# Patient Record
Sex: Female | Born: 1935 | Race: White | Hispanic: No | Smoking: Never smoker
Health system: Southern US, Community
[De-identification: ages and names within clinical notes are randomized; demographics above are authoritative.]

## PROBLEM LIST (undated history)

## (undated) DIAGNOSIS — R569 Unspecified convulsions: Secondary | ICD-10-CM

## (undated) DIAGNOSIS — J45909 Unspecified asthma, uncomplicated: Secondary | ICD-10-CM

## (undated) DIAGNOSIS — M1990 Unspecified osteoarthritis, unspecified site: Secondary | ICD-10-CM

## (undated) DIAGNOSIS — I10 Essential (primary) hypertension: Secondary | ICD-10-CM

## (undated) HISTORY — PX: BACK SURGERY: SHX140

## (undated) HISTORY — PX: CARDIAC CATHETERIZATION: SHX172

## (undated) HISTORY — PX: JOINT REPLACEMENT: SHX530

---

## 1998-11-16 ENCOUNTER — Encounter: Payer: Self-pay | Admitting: Neurosurgery

## 1998-11-18 ENCOUNTER — Inpatient Hospital Stay (HOSPITAL_COMMUNITY): Admission: RE | Admit: 1998-11-18 | Discharge: 1998-11-22 | Payer: Self-pay | Admitting: Neurosurgery

## 1998-11-18 ENCOUNTER — Encounter: Payer: Self-pay | Admitting: Neurosurgery

## 2000-02-14 ENCOUNTER — Encounter: Payer: Self-pay | Admitting: Neurosurgery

## 2000-02-14 ENCOUNTER — Encounter: Admission: RE | Admit: 2000-02-14 | Discharge: 2000-02-14 | Payer: Self-pay | Admitting: Neurosurgery

## 2003-11-30 ENCOUNTER — Other Ambulatory Visit: Payer: Self-pay

## 2004-07-15 ENCOUNTER — Other Ambulatory Visit: Payer: Self-pay

## 2004-07-28 ENCOUNTER — Ambulatory Visit: Payer: Self-pay | Admitting: General Practice

## 2004-10-03 ENCOUNTER — Ambulatory Visit: Payer: Self-pay | Admitting: Internal Medicine

## 2004-10-12 ENCOUNTER — Ambulatory Visit: Payer: Self-pay | Admitting: Internal Medicine

## 2004-10-13 ENCOUNTER — Ambulatory Visit: Payer: Self-pay | Admitting: Internal Medicine

## 2004-11-16 ENCOUNTER — Ambulatory Visit (HOSPITAL_COMMUNITY): Admission: RE | Admit: 2004-11-16 | Discharge: 2004-11-16 | Payer: Self-pay | Admitting: Neurosurgery

## 2004-12-29 ENCOUNTER — Inpatient Hospital Stay (HOSPITAL_COMMUNITY): Admission: RE | Admit: 2004-12-29 | Discharge: 2005-01-03 | Payer: Self-pay | Admitting: Neurosurgery

## 2005-04-13 ENCOUNTER — Ambulatory Visit: Payer: Self-pay | Admitting: Internal Medicine

## 2005-05-01 ENCOUNTER — Ambulatory Visit: Payer: Self-pay | Admitting: Internal Medicine

## 2005-07-26 ENCOUNTER — Ambulatory Visit: Payer: Self-pay | Admitting: Unknown Physician Specialty

## 2005-09-11 ENCOUNTER — Ambulatory Visit: Payer: Self-pay | Admitting: Otolaryngology

## 2005-09-28 ENCOUNTER — Ambulatory Visit: Payer: Self-pay | Admitting: Pain Medicine

## 2005-10-03 ENCOUNTER — Ambulatory Visit: Payer: Self-pay | Admitting: Pain Medicine

## 2005-10-04 ENCOUNTER — Ambulatory Visit: Payer: Self-pay | Admitting: Pain Medicine

## 2005-10-25 ENCOUNTER — Ambulatory Visit: Payer: Self-pay | Admitting: Internal Medicine

## 2005-10-25 ENCOUNTER — Ambulatory Visit: Payer: Self-pay | Admitting: Physician Assistant

## 2005-11-06 ENCOUNTER — Inpatient Hospital Stay (HOSPITAL_COMMUNITY): Admission: RE | Admit: 2005-11-06 | Discharge: 2005-11-08 | Payer: Self-pay | Admitting: Neurosurgery

## 2005-11-29 ENCOUNTER — Ambulatory Visit: Payer: Self-pay | Admitting: Otolaryngology

## 2005-12-18 ENCOUNTER — Ambulatory Visit: Payer: Self-pay | Admitting: Internal Medicine

## 2006-06-11 ENCOUNTER — Ambulatory Visit: Payer: Self-pay | Admitting: Internal Medicine

## 2006-06-30 ENCOUNTER — Ambulatory Visit: Payer: Self-pay | Admitting: Internal Medicine

## 2006-08-13 ENCOUNTER — Inpatient Hospital Stay (HOSPITAL_COMMUNITY): Admission: RE | Admit: 2006-08-13 | Discharge: 2006-08-16 | Payer: Self-pay | Admitting: Neurosurgery

## 2006-10-30 ENCOUNTER — Ambulatory Visit: Payer: Self-pay

## 2006-11-06 ENCOUNTER — Ambulatory Visit: Payer: Self-pay | Admitting: Internal Medicine

## 2006-12-24 ENCOUNTER — Other Ambulatory Visit: Payer: Self-pay

## 2006-12-24 ENCOUNTER — Ambulatory Visit: Payer: Self-pay | Admitting: General Practice

## 2007-01-04 ENCOUNTER — Ambulatory Visit: Payer: Self-pay | Admitting: Internal Medicine

## 2007-01-07 ENCOUNTER — Ambulatory Visit: Payer: Self-pay | Admitting: General Practice

## 2007-04-04 ENCOUNTER — Ambulatory Visit: Payer: Self-pay | Admitting: Internal Medicine

## 2007-04-04 ENCOUNTER — Inpatient Hospital Stay: Payer: Self-pay | Admitting: Internal Medicine

## 2007-07-24 ENCOUNTER — Ambulatory Visit: Payer: Self-pay | Admitting: Internal Medicine

## 2007-11-21 ENCOUNTER — Ambulatory Visit: Payer: Self-pay | Admitting: Internal Medicine

## 2008-06-24 ENCOUNTER — Inpatient Hospital Stay: Payer: Self-pay | Admitting: Internal Medicine

## 2008-07-01 ENCOUNTER — Ambulatory Visit: Payer: Self-pay | Admitting: Gastroenterology

## 2008-07-23 ENCOUNTER — Ambulatory Visit: Payer: Self-pay | Admitting: Gastroenterology

## 2008-10-20 ENCOUNTER — Ambulatory Visit: Payer: Self-pay | Admitting: Internal Medicine

## 2009-01-19 ENCOUNTER — Ambulatory Visit: Payer: Self-pay | Admitting: Internal Medicine

## 2009-02-19 ENCOUNTER — Inpatient Hospital Stay: Payer: Self-pay | Admitting: Internal Medicine

## 2009-10-06 ENCOUNTER — Ambulatory Visit: Payer: Self-pay | Admitting: Internal Medicine

## 2010-02-24 ENCOUNTER — Ambulatory Visit: Payer: Self-pay | Admitting: Internal Medicine

## 2010-04-04 ENCOUNTER — Ambulatory Visit: Payer: Self-pay | Admitting: Internal Medicine

## 2010-06-28 ENCOUNTER — Ambulatory Visit: Payer: Self-pay | Admitting: Unknown Physician Specialty

## 2010-07-12 ENCOUNTER — Ambulatory Visit: Payer: Self-pay | Admitting: Unknown Physician Specialty

## 2010-07-13 LAB — PATHOLOGY REPORT

## 2010-07-14 ENCOUNTER — Inpatient Hospital Stay: Payer: Self-pay | Admitting: Internal Medicine

## 2010-09-13 ENCOUNTER — Encounter: Payer: Self-pay | Admitting: Specialist

## 2010-09-22 ENCOUNTER — Encounter: Payer: Self-pay | Admitting: Specialist

## 2010-10-07 ENCOUNTER — Ambulatory Visit: Payer: Self-pay | Admitting: Orthopedic Surgery

## 2010-10-23 ENCOUNTER — Encounter: Payer: Self-pay | Admitting: Specialist

## 2011-03-17 ENCOUNTER — Ambulatory Visit: Payer: Self-pay | Admitting: Cardiology

## 2011-06-05 ENCOUNTER — Ambulatory Visit: Payer: Self-pay | Admitting: Internal Medicine

## 2011-10-06 ENCOUNTER — Ambulatory Visit: Payer: Self-pay | Admitting: Ophthalmology

## 2011-10-10 ENCOUNTER — Ambulatory Visit: Payer: Self-pay | Admitting: Ophthalmology

## 2011-11-17 ENCOUNTER — Emergency Department: Payer: Self-pay | Admitting: Emergency Medicine

## 2011-11-17 LAB — COMPREHENSIVE METABOLIC PANEL
Anion Gap: 10 (ref 7–16)
BUN: 12 mg/dL (ref 7–18)
Bilirubin,Total: 0.5 mg/dL (ref 0.2–1.0)
Calcium, Total: 9.4 mg/dL (ref 8.5–10.1)
Chloride: 98 mmol/L (ref 98–107)
Glucose: 111 mg/dL — ABNORMAL HIGH (ref 65–99)
Osmolality: 271 (ref 275–301)
Potassium: 3.3 mmol/L — ABNORMAL LOW (ref 3.5–5.1)
SGOT(AST): 24 U/L (ref 15–37)
SGPT (ALT): 22 U/L
Total Protein: 6.7 g/dL (ref 6.4–8.2)

## 2011-11-17 LAB — CBC
HCT: 38.8 % (ref 35.0–47.0)
MCH: 27.6 pg (ref 26.0–34.0)
MCHC: 32.6 g/dL (ref 32.0–36.0)
MCV: 85 fL (ref 80–100)
Platelet: 365 10*3/uL (ref 150–440)
RDW: 16.8 % — ABNORMAL HIGH (ref 11.5–14.5)

## 2011-11-17 LAB — TROPONIN I: Troponin-I: <0.02 ng/mL

## 2011-11-17 LAB — URINALYSIS, COMPLETE
Bilirubin,UR: NEGATIVE
Leukocyte Esterase: NEGATIVE
Nitrite: NEGATIVE
Ph: 6 (ref 4.5–8.0)
Protein: NEGATIVE
RBC,UR: <1 /HPF (ref 0–5)

## 2011-11-28 ENCOUNTER — Ambulatory Visit: Payer: Self-pay | Admitting: Ophthalmology

## 2012-01-02 LAB — CBC WITH DIFFERENTIAL/PLATELET
Basophil %: 0.6 %
Eosinophil #: 0.1 10*3/uL (ref 0.0–0.7)
Eosinophil %: 1.1 %
HCT: 37.7 % (ref 35.0–47.0)
HGB: 12.1 g/dL (ref 12.0–16.0)
Lymphocyte %: 29.8 %
MCH: 27.7 pg (ref 26.0–34.0)
Monocyte %: 9.7 %
Neutrophil #: 3.6 10*3/uL (ref 1.4–6.5)
Neutrophil %: 58.8 %
RBC: 4.38 10*6/uL (ref 3.80–5.20)

## 2012-01-02 LAB — COMPREHENSIVE METABOLIC PANEL
Anion Gap: 8 (ref 7–16)
Calcium, Total: 9.7 mg/dL (ref 8.5–10.1)
Chloride: 97 mmol/L — ABNORMAL LOW (ref 98–107)
Co2: 26 mmol/L (ref 21–32)
EGFR (African American): >60
Osmolality: 261 (ref 275–301)
Potassium: 3.5 mmol/L (ref 3.5–5.1)
Sodium: 131 mmol/L — ABNORMAL LOW (ref 136–145)

## 2012-01-02 LAB — TSH: Thyroid Stimulating Horm: 8.48 u[IU]/mL — ABNORMAL HIGH

## 2012-01-02 LAB — CK-MB: CK-MB: 1 ng/mL (ref 0.5–3.6)

## 2012-01-03 LAB — CK-MB
CK-MB: 0.7 ng/mL (ref 0.5–3.6)
CK-MB: 0.8 ng/mL (ref 0.5–3.6)

## 2012-01-03 LAB — BASIC METABOLIC PANEL
Co2: 25 mmol/L (ref 21–32)
Creatinine: 0.8 mg/dL (ref 0.60–1.30)
EGFR (African American): >60
EGFR (Non-African Amer.): >60
Glucose: 155 mg/dL — ABNORMAL HIGH (ref 65–99)
Sodium: 138 mmol/L (ref 136–145)

## 2012-01-03 LAB — URINALYSIS, COMPLETE
Bacteria: NONE SEEN
Bilirubin,UR: NEGATIVE
Blood: NEGATIVE
Ketone: NEGATIVE
Ph: 6 (ref 4.5–8.0)
Specific Gravity: 1.006 (ref 1.003–1.030)
Transitional Epi: <1
WBC UR: <1 /HPF (ref 0–5)

## 2012-01-03 LAB — TROPONIN I: Troponin-I: <0.02 ng/mL

## 2012-01-04 ENCOUNTER — Inpatient Hospital Stay: Payer: Self-pay | Admitting: Internal Medicine

## 2012-01-04 LAB — CBC WITH DIFFERENTIAL/PLATELET
Basophil #: 0 10*3/uL (ref 0.0–0.1)
Eosinophil #: 0.1 10*3/uL (ref 0.0–0.7)
HCT: 34.1 % — ABNORMAL LOW (ref 35.0–47.0)
Lymphocyte #: 1.7 10*3/uL (ref 1.0–3.6)
Lymphocyte %: 37.5 %
MCV: 85 fL (ref 80–100)
Neutrophil #: 2.1 10*3/uL (ref 1.4–6.5)
Platelet: 356 10*3/uL (ref 150–440)
RBC: 4.02 10*6/uL (ref 3.80–5.20)
WBC: 4.6 10*3/uL (ref 3.6–11.0)

## 2012-01-04 LAB — CK TOTAL AND CKMB (NOT AT ARMC)
CK, Total: 40 U/L (ref 21–215)
CK, Total: 35 U/L (ref 21–215)

## 2012-01-04 LAB — BASIC METABOLIC PANEL
Co2: 23 mmol/L (ref 21–32)
Creatinine: 0.73 mg/dL (ref 0.60–1.30)
EGFR (African American): >60
EGFR (Non-African Amer.): >60
Sodium: 138 mmol/L (ref 136–145)

## 2012-01-04 LAB — TROPONIN I: Troponin-I: <0.02 ng/mL

## 2012-01-05 LAB — CBC WITH DIFFERENTIAL/PLATELET
Basophil %: 0.8 %
Eosinophil #: 0.1 10*3/uL (ref 0.0–0.7)
HGB: 11.6 g/dL — ABNORMAL LOW (ref 12.0–16.0)
Lymphocyte %: 24.3 %
MCH: 27.6 pg (ref 26.0–34.0)
Monocyte #: 0.8 10*3/uL — ABNORMAL HIGH (ref 0.0–0.7)
Monocyte %: 15.5 %
Neutrophil #: 3.1 10*3/uL (ref 1.4–6.5)
Neutrophil %: 57.5 %
Platelet: 355 10*3/uL (ref 150–440)
RBC: 4.2 10*6/uL (ref 3.80–5.20)
WBC: 5.4 10*3/uL (ref 3.6–11.0)

## 2012-01-05 LAB — COMPREHENSIVE METABOLIC PANEL
Anion Gap: 10 (ref 7–16)
Calcium, Total: 9.4 mg/dL (ref 8.5–10.1)
Chloride: 95 mmol/L — ABNORMAL LOW (ref 98–107)
Creatinine: 0.65 mg/dL (ref 0.60–1.30)
EGFR (African American): >60
Glucose: 106 mg/dL — ABNORMAL HIGH (ref 65–99)
Osmolality: 260 (ref 275–301)
Potassium: 4.2 mmol/L (ref 3.5–5.1)
SGOT(AST): 20 U/L (ref 15–37)
Sodium: 131 mmol/L — ABNORMAL LOW (ref 136–145)
Total Protein: 6.9 g/dL (ref 6.4–8.2)

## 2012-01-06 LAB — CBC WITH DIFFERENTIAL/PLATELET
Basophil %: 0.3 %
Eosinophil #: 0.1 10*3/uL (ref 0.0–0.7)
Eosinophil %: 1 %
HCT: 35.2 % (ref 35.0–47.0)
Lymphocyte #: 1.2 10*3/uL (ref 1.0–3.6)
MCH: 27.9 pg (ref 26.0–34.0)
MCV: 86 fL (ref 80–100)
Monocyte %: 15.4 %
Neutrophil #: 4.2 10*3/uL (ref 1.4–6.5)
RBC: 4.1 10*6/uL (ref 3.80–5.20)
WBC: 6.5 10*3/uL (ref 3.6–11.0)

## 2012-01-06 LAB — MAGNESIUM: Magnesium: 2 mg/dL

## 2012-01-06 LAB — COMPREHENSIVE METABOLIC PANEL
Alkaline Phosphatase: 69 U/L (ref 50–136)
Anion Gap: 10 (ref 7–16)
BUN: 6 mg/dL — ABNORMAL LOW (ref 7–18)
Bilirubin,Total: 0.4 mg/dL (ref 0.2–1.0)
Chloride: 99 mmol/L (ref 98–107)
Creatinine: 0.8 mg/dL (ref 0.60–1.30)
Osmolality: 266 (ref 275–301)
SGPT (ALT): 12 U/L
Sodium: 134 mmol/L — ABNORMAL LOW (ref 136–145)
Total Protein: 6.9 g/dL (ref 6.4–8.2)

## 2012-01-22 ENCOUNTER — Emergency Department: Payer: Self-pay | Admitting: *Deleted

## 2012-01-22 LAB — COMPREHENSIVE METABOLIC PANEL
Albumin: 3.8 g/dL (ref 3.4–5.0)
BUN: 11 mg/dL (ref 7–18)
Co2: 22 mmol/L (ref 21–32)
EGFR (Non-African Amer.): >60
Glucose: 104 mg/dL — ABNORMAL HIGH (ref 65–99)
Potassium: 4.1 mmol/L (ref 3.5–5.1)
SGPT (ALT): 18 U/L
Total Protein: 7.6 g/dL (ref 6.4–8.2)

## 2012-01-22 LAB — URINALYSIS, COMPLETE
Bacteria: NONE SEEN
Bilirubin,UR: NEGATIVE
Glucose,UR: NEGATIVE mg/dL (ref 0–75)
Nitrite: NEGATIVE
Ph: 7 (ref 4.5–8.0)
Protein: NEGATIVE
RBC,UR: <1 /HPF (ref 0–5)
Squamous Epithelial: 1
WBC UR: 1 /HPF (ref 0–5)

## 2012-01-22 LAB — TROPONIN I: Troponin-I: <0.02 ng/mL

## 2012-01-22 LAB — CBC
MCH: 27.7 pg (ref 26.0–34.0)
RDW: 15.9 % — ABNORMAL HIGH (ref 11.5–14.5)

## 2012-01-22 LAB — TSH: Thyroid Stimulating Horm: 10.3 u[IU]/mL — ABNORMAL HIGH

## 2012-01-25 ENCOUNTER — Inpatient Hospital Stay: Payer: Self-pay | Admitting: Internal Medicine

## 2012-01-25 LAB — COMPREHENSIVE METABOLIC PANEL
Alkaline Phosphatase: 101 U/L (ref 50–136)
Anion Gap: 9 (ref 7–16)
BUN: 9 mg/dL (ref 7–18)
Chloride: 90 mmol/L — ABNORMAL LOW (ref 98–107)
Co2: 26 mmol/L (ref 21–32)
Creatinine: 0.67 mg/dL (ref 0.60–1.30)
Osmolality: 250 (ref 275–301)
SGOT(AST): 26 U/L (ref 15–37)
SGPT (ALT): 19 U/L
Total Protein: 7.9 g/dL (ref 6.4–8.2)

## 2012-01-25 LAB — URINALYSIS, COMPLETE
Bacteria: NONE SEEN
Blood: NEGATIVE
Nitrite: NEGATIVE
RBC,UR: <1 /HPF (ref 0–5)
Specific Gravity: 1.009 (ref 1.003–1.030)
Squamous Epithelial: 1
WBC UR: 1 /HPF (ref 0–5)

## 2012-01-25 LAB — CBC
HCT: 38.2 % (ref 35.0–47.0)
HGB: 12.4 g/dL (ref 12.0–16.0)
MCH: 27.8 pg (ref 26.0–34.0)
MCHC: 32.6 g/dL (ref 32.0–36.0)
Platelet: 464 10*3/uL — ABNORMAL HIGH (ref 150–440)
RBC: 4.48 10*6/uL (ref 3.80–5.20)
RDW: 16.2 % — ABNORMAL HIGH (ref 11.5–14.5)

## 2012-01-25 LAB — MAGNESIUM: Magnesium: 1.4 mg/dL — ABNORMAL LOW

## 2012-01-25 LAB — AMYLASE: Amylase: 49 U/L (ref 25–115)

## 2012-01-25 LAB — URIC ACID: Uric Acid: 2.5 mg/dL — ABNORMAL LOW (ref 2.6–6.0)

## 2012-01-25 LAB — T4, FREE: Free Thyroxine: 1.13 ng/dL (ref 0.76–1.46)

## 2012-01-26 LAB — BASIC METABOLIC PANEL
Anion Gap: 10 (ref 7–16)
BUN: 9 mg/dL (ref 7–18)
Calcium, Total: 9.2 mg/dL (ref 8.5–10.1)
EGFR (African American): >60
Glucose: 83 mg/dL (ref 65–99)
Osmolality: 264 (ref 275–301)
Potassium: 4.6 mmol/L (ref 3.5–5.1)

## 2012-01-26 LAB — CBC WITH DIFFERENTIAL/PLATELET
Basophil #: 0 10*3/uL (ref 0.0–0.1)
Basophil %: 0.7 %
HCT: 34.5 % — ABNORMAL LOW (ref 35.0–47.0)
HGB: 11.3 g/dL — ABNORMAL LOW (ref 12.0–16.0)
Lymphocyte #: 1.4 10*3/uL (ref 1.0–3.6)
MCH: 27.7 pg (ref 26.0–34.0)
MCV: 85 fL (ref 80–100)
Monocyte #: 0.8 10*3/uL — ABNORMAL HIGH (ref 0.0–0.7)
Neutrophil %: 61.2 %
RBC: 4.07 10*6/uL (ref 3.80–5.20)
RDW: 16.3 % — ABNORMAL HIGH (ref 11.5–14.5)
WBC: 6.2 10*3/uL (ref 3.6–11.0)

## 2012-01-26 LAB — CHLORIDE, URINE, RANDOM: Chloride, Urine Random: 188 mmol/L — ABNORMAL HIGH

## 2012-01-26 LAB — OSMOLALITY, URINE
Osmolality: 306 mOsm/kg
Osmolality: 513 mOsm/kg

## 2012-01-26 LAB — POTASSIUM, URINE RANDOM: Potassium, Urine Random: 67 mmol/L (ref 55–125)

## 2012-01-26 LAB — HEPATIC FUNCTION PANEL A (ARMC)
Alkaline Phosphatase: 87 U/L (ref 50–136)
Bilirubin, Direct: <0.1 mg/dL (ref 0.00–0.20)
Bilirubin,Total: 0.2 mg/dL (ref 0.2–1.0)
SGOT(AST): 22 U/L (ref 15–37)
SGPT (ALT): 16 U/L
Total Protein: 6.6 g/dL (ref 6.4–8.2)

## 2012-01-26 LAB — SODIUM, URINE, RANDOM: Sodium, Urine Random: 40 mmol/L (ref 20–110)

## 2012-01-27 LAB — BASIC METABOLIC PANEL
Calcium, Total: 9.6 mg/dL (ref 8.5–10.1)
Chloride: 97 mmol/L — ABNORMAL LOW (ref 98–107)
Co2: 25 mmol/L (ref 21–32)
EGFR (African American): >60
EGFR (Non-African Amer.): >60
Osmolality: 262 (ref 275–301)
Sodium: 132 mmol/L — ABNORMAL LOW (ref 136–145)

## 2012-01-27 LAB — URINE CULTURE

## 2012-01-28 LAB — BASIC METABOLIC PANEL
BUN: 8 mg/dL (ref 7–18)
Chloride: 95 mmol/L — ABNORMAL LOW (ref 98–107)
Co2: 26 mmol/L (ref 21–32)
Creatinine: 0.69 mg/dL (ref 0.60–1.30)
EGFR (African American): >60
Glucose: 102 mg/dL — ABNORMAL HIGH (ref 65–99)
Potassium: 4.1 mmol/L (ref 3.5–5.1)

## 2012-01-28 LAB — CBC WITH DIFFERENTIAL/PLATELET
Basophil #: 0 10*3/uL (ref 0.0–0.1)
Eosinophil #: 0.1 10*3/uL (ref 0.0–0.7)
HCT: 35.3 % (ref 35.0–47.0)
Lymphocyte #: 1.9 10*3/uL (ref 1.0–3.6)
Lymphocyte %: 32 %
MCH: 28 pg (ref 26.0–34.0)
MCHC: 32.3 g/dL (ref 32.0–36.0)
MCV: 87 fL (ref 80–100)
Monocyte %: 11.2 %
Neutrophil #: 3.3 10*3/uL (ref 1.4–6.5)
Neutrophil %: 54.6 %
Platelet: 388 10*3/uL (ref 150–440)
RBC: 4.07 10*6/uL (ref 3.80–5.20)
RDW: 16.3 % — ABNORMAL HIGH (ref 11.5–14.5)
WBC: 6 10*3/uL (ref 3.6–11.0)

## 2012-01-29 LAB — BASIC METABOLIC PANEL
Calcium, Total: 9.7 mg/dL (ref 8.5–10.1)
Co2: 27 mmol/L (ref 21–32)
Creatinine: 0.74 mg/dL (ref 0.60–1.30)
Glucose: 112 mg/dL — ABNORMAL HIGH (ref 65–99)
Osmolality: 265 (ref 275–301)
Potassium: 4.5 mmol/L (ref 3.5–5.1)

## 2012-01-30 LAB — BASIC METABOLIC PANEL
Anion Gap: 10 (ref 7–16)
BUN: 15 mg/dL (ref 7–18)
Chloride: 98 mmol/L (ref 98–107)
Co2: 24 mmol/L (ref 21–32)
EGFR (African American): >60
Osmolality: 265 (ref 275–301)
Potassium: 4.5 mmol/L (ref 3.5–5.1)

## 2012-01-31 ENCOUNTER — Encounter: Payer: Self-pay | Admitting: Internal Medicine

## 2012-02-07 LAB — BASIC METABOLIC PANEL
Chloride: 97 mmol/L — ABNORMAL LOW (ref 98–107)
Co2: 26 mmol/L (ref 21–32)
EGFR (African American): >60
Glucose: 85 mg/dL (ref 65–99)

## 2012-02-21 ENCOUNTER — Encounter: Payer: Self-pay | Admitting: Internal Medicine

## 2012-04-18 ENCOUNTER — Emergency Department: Payer: Self-pay | Admitting: *Deleted

## 2012-04-18 LAB — URINALYSIS, COMPLETE
Bilirubin,UR: NEGATIVE
Glucose,UR: NEGATIVE mg/dL (ref 0–75)
Leukocyte Esterase: NEGATIVE
Ph: 8 (ref 4.5–8.0)
Protein: NEGATIVE
RBC,UR: NONE SEEN /HPF (ref 0–5)

## 2012-04-18 LAB — COMPREHENSIVE METABOLIC PANEL
Anion Gap: 13 (ref 7–16)
BUN: 6 mg/dL — ABNORMAL LOW (ref 7–18)
Bilirubin,Total: 0.6 mg/dL (ref 0.2–1.0)
Calcium, Total: 9.4 mg/dL (ref 8.5–10.1)
Co2: 23 mmol/L (ref 21–32)
Creatinine: 1.01 mg/dL (ref 0.60–1.30)
EGFR (Non-African Amer.): 53 — ABNORMAL LOW
Potassium: 2.9 mmol/L — ABNORMAL LOW (ref 3.5–5.1)
SGOT(AST): 28 U/L (ref 15–37)
SGPT (ALT): 20 U/L

## 2012-04-18 LAB — LIPASE, BLOOD: Lipase: 118 U/L (ref 73–393)

## 2012-04-18 LAB — CBC
HCT: 36.8 % (ref 35.0–47.0)
MCH: 27.5 pg (ref 26.0–34.0)
MCHC: 32.1 g/dL (ref 32.0–36.0)
Platelet: 439 10*3/uL (ref 150–440)
RBC: 4.3 10*6/uL (ref 3.80–5.20)
WBC: 9.4 10*3/uL (ref 3.6–11.0)

## 2012-06-05 ENCOUNTER — Ambulatory Visit: Payer: Self-pay | Admitting: Internal Medicine

## 2013-06-27 ENCOUNTER — Emergency Department: Payer: Self-pay | Admitting: Emergency Medicine

## 2013-06-27 LAB — COMPREHENSIVE METABOLIC PANEL
Albumin: 3.4 g/dL (ref 3.4–5.0)
BUN: 13 mg/dL (ref 7–18)
Bilirubin,Total: 0.3 mg/dL (ref 0.2–1.0)
Calcium, Total: 9.9 mg/dL (ref 8.5–10.1)
Co2: 23 mmol/L (ref 21–32)
Creatinine: 0.79 mg/dL (ref 0.60–1.30)
EGFR (Non-African Amer.): >60
Glucose: 99 mg/dL (ref 65–99)
Osmolality: 255 (ref 275–301)
Potassium: 4.3 mmol/L (ref 3.5–5.1)
SGOT(AST): 24 U/L (ref 15–37)
Sodium: 127 mmol/L — ABNORMAL LOW (ref 136–145)
Total Protein: 7.3 g/dL (ref 6.4–8.2)

## 2013-06-27 LAB — URINALYSIS, COMPLETE
Bacteria: NONE SEEN
Bilirubin,UR: NEGATIVE
Blood: NEGATIVE
Glucose,UR: NEGATIVE mg/dL (ref 0–75)
Nitrite: NEGATIVE
Ph: 6 (ref 4.5–8.0)
Protein: 30
RBC,UR: 2 /HPF (ref 0–5)
Specific Gravity: 1.02 (ref 1.003–1.030)
WBC UR: 3 /HPF (ref 0–5)

## 2013-06-27 LAB — CBC
HCT: 36.5 % (ref 35.0–47.0)
HGB: 12.1 g/dL (ref 12.0–16.0)
MCHC: 33.3 g/dL (ref 32.0–36.0)
Platelet: 464 10*3/uL — ABNORMAL HIGH (ref 150–440)
WBC: 11.3 10*3/uL — ABNORMAL HIGH (ref 3.6–11.0)

## 2013-06-27 LAB — TROPONIN I: Troponin-I: <0.02 ng/mL

## 2013-07-03 ENCOUNTER — Inpatient Hospital Stay: Payer: Self-pay | Admitting: Internal Medicine

## 2013-07-03 LAB — CBC WITH DIFFERENTIAL/PLATELET
Basophil #: 0.1 10*3/uL (ref 0.0–0.1)
Eosinophil #: 0.1 10*3/uL (ref 0.0–0.7)
Eosinophil %: 0.9 %
HCT: 34.2 % — ABNORMAL LOW (ref 35.0–47.0)
Lymphocyte #: 1.4 10*3/uL (ref 1.0–3.6)
Lymphocyte %: 21 %
MCH: 28 pg (ref 26.0–34.0)
MCHC: 33.9 g/dL (ref 32.0–36.0)
Monocyte #: 1.1 x10 3/mm — ABNORMAL HIGH (ref 0.2–0.9)
Neutrophil %: 61 %
RDW: 16.2 % — ABNORMAL HIGH (ref 11.5–14.5)

## 2013-07-03 LAB — COMPREHENSIVE METABOLIC PANEL
Albumin: 3.2 g/dL — ABNORMAL LOW (ref 3.4–5.0)
Alkaline Phosphatase: 98 U/L (ref 50–136)
BUN: 9 mg/dL (ref 7–18)
Bilirubin,Total: 0.4 mg/dL (ref 0.2–1.0)
Calcium, Total: 8.9 mg/dL (ref 8.5–10.1)
Co2: 22 mmol/L (ref 21–32)
Creatinine: 0.5 mg/dL — ABNORMAL LOW (ref 0.60–1.30)
EGFR (African American): >60
Glucose: 102 mg/dL — ABNORMAL HIGH (ref 65–99)
Osmolality: 234 (ref 275–301)
Potassium: 3.7 mmol/L (ref 3.5–5.1)
SGPT (ALT): 25 U/L (ref 12–78)
Sodium: 116 mmol/L — CL (ref 136–145)
Total Protein: 6.6 g/dL (ref 6.4–8.2)

## 2013-07-03 LAB — MAGNESIUM: Magnesium: 1.4 mg/dL — ABNORMAL LOW

## 2013-07-03 LAB — SEDIMENTATION RATE: Erythrocyte Sed Rate: 19 mm/hr (ref 0–30)

## 2013-07-04 LAB — BASIC METABOLIC PANEL
BUN: 7 mg/dL (ref 7–18)
Calcium, Total: 8.8 mg/dL (ref 8.5–10.1)
Co2: 21 mmol/L (ref 21–32)
Creatinine: 0.54 mg/dL — ABNORMAL LOW (ref 0.60–1.30)
EGFR (Non-African Amer.): >60
Potassium: 4.1 mmol/L (ref 3.5–5.1)
Sodium: 116 mmol/L — CL (ref 136–145)

## 2013-07-05 LAB — BASIC METABOLIC PANEL
Anion Gap: 6 — ABNORMAL LOW (ref 7–16)
Chloride: 100 mmol/L (ref 98–107)
Co2: 24 mmol/L (ref 21–32)
Creatinine: 0.6 mg/dL (ref 0.60–1.30)
EGFR (African American): >60
Sodium: 130 mmol/L — ABNORMAL LOW (ref 136–145)

## 2013-07-05 LAB — OSMOLALITY, URINE: Osmolality: 77 mOsm/kg

## 2013-07-06 LAB — BASIC METABOLIC PANEL
BUN: 14 mg/dL (ref 7–18)
Calcium, Total: 9.3 mg/dL (ref 8.5–10.1)
Chloride: 104 mmol/L (ref 98–107)
EGFR (African American): >60
EGFR (Non-African Amer.): >60
Glucose: 97 mg/dL (ref 65–99)
Sodium: 134 mmol/L — ABNORMAL LOW (ref 136–145)

## 2013-07-07 LAB — BASIC METABOLIC PANEL
Anion Gap: 6 — ABNORMAL LOW (ref 7–16)
BUN: 14 mg/dL (ref 7–18)
Calcium, Total: 9.2 mg/dL (ref 8.5–10.1)
Chloride: 98 mmol/L (ref 98–107)
Co2: 26 mmol/L (ref 21–32)
Creatinine: 0.71 mg/dL (ref 0.60–1.30)
EGFR (African American): >60
Glucose: 94 mg/dL (ref 65–99)
Sodium: 130 mmol/L — ABNORMAL LOW (ref 136–145)

## 2013-09-11 ENCOUNTER — Ambulatory Visit: Payer: Self-pay | Admitting: Internal Medicine

## 2013-09-26 ENCOUNTER — Ambulatory Visit: Payer: Self-pay | Admitting: Physical Medicine and Rehabilitation

## 2013-10-30 ENCOUNTER — Inpatient Hospital Stay: Payer: Self-pay | Admitting: Internal Medicine

## 2013-10-30 LAB — TSH: Thyroid Stimulating Horm: 6.34 u[IU]/mL — ABNORMAL HIGH

## 2013-10-30 LAB — COMPREHENSIVE METABOLIC PANEL
ALK PHOS: 60 U/L
ALT: 26 U/L (ref 12–78)
ANION GAP: 6 — AB (ref 7–16)
AST: 20 U/L (ref 15–37)
Albumin: 3.7 g/dL (ref 3.4–5.0)
BILIRUBIN TOTAL: 0.4 mg/dL (ref 0.2–1.0)
BUN: 10 mg/dL (ref 7–18)
CALCIUM: 9.5 mg/dL (ref 8.5–10.1)
CHLORIDE: 93 mmol/L — AB (ref 98–107)
Co2: 27 mmol/L (ref 21–32)
Creatinine: 0.65 mg/dL (ref 0.60–1.30)
EGFR (African American): >60
EGFR (Non-African Amer.): >60
GLUCOSE: 87 mg/dL (ref 65–99)
OSMOLALITY: 252 (ref 275–301)
POTASSIUM: 3.9 mmol/L (ref 3.5–5.1)
SODIUM: 126 mmol/L — AB (ref 136–145)
Total Protein: 7 g/dL (ref 6.4–8.2)

## 2013-10-30 LAB — URINALYSIS, COMPLETE
BILIRUBIN, UR: NEGATIVE
Bacteria: NONE SEEN
Blood: NEGATIVE
GLUCOSE, UR: NEGATIVE mg/dL (ref 0–75)
KETONE: NEGATIVE
Leukocyte Esterase: NEGATIVE
Nitrite: NEGATIVE
PH: 7 (ref 4.5–8.0)
PROTEIN: NEGATIVE
RBC, UR: NONE SEEN /HPF (ref 0–5)
Specific Gravity: 1.015 (ref 1.003–1.030)
Squamous Epithelial: 1

## 2013-10-30 LAB — CBC WITH DIFFERENTIAL/PLATELET
BASOS PCT: 0.8 %
Basophil #: 0.1 10*3/uL (ref 0.0–0.1)
EOS PCT: 0.9 %
Eosinophil #: 0.1 10*3/uL (ref 0.0–0.7)
HCT: 38.1 % (ref 35.0–47.0)
HGB: 13 g/dL (ref 12.0–16.0)
LYMPHS ABS: 2.1 10*3/uL (ref 1.0–3.6)
LYMPHS PCT: 24.9 %
MCH: 29.9 pg (ref 26.0–34.0)
MCHC: 34.2 g/dL (ref 32.0–36.0)
MCV: 87 fL (ref 80–100)
MONO ABS: 0.8 x10 3/mm (ref 0.2–0.9)
Monocyte %: 9.7 %
NEUTROS ABS: 5.3 10*3/uL (ref 1.4–6.5)
Neutrophil %: 63.7 %
Platelet: 389 10*3/uL (ref 150–440)
RBC: 4.37 10*6/uL (ref 3.80–5.20)
RDW: 18.5 % — AB (ref 11.5–14.5)
WBC: 8.3 10*3/uL (ref 3.6–11.0)

## 2013-10-31 LAB — BASIC METABOLIC PANEL
Anion Gap: 6 — ABNORMAL LOW (ref 7–16)
BUN: 8 mg/dL (ref 7–18)
CO2: 25 mmol/L (ref 21–32)
CREATININE: 0.64 mg/dL (ref 0.60–1.30)
Calcium, Total: 9.2 mg/dL (ref 8.5–10.1)
Chloride: 100 mmol/L (ref 98–107)
EGFR (African American): >60
EGFR (Non-African Amer.): >60
Glucose: 89 mg/dL (ref 65–99)
OSMOLALITY: 260 (ref 275–301)
Potassium: 3.7 mmol/L (ref 3.5–5.1)
SODIUM: 131 mmol/L — AB (ref 136–145)

## 2013-11-01 LAB — BASIC METABOLIC PANEL
Anion Gap: 7 (ref 7–16)
BUN: 11 mg/dL (ref 7–18)
Calcium, Total: 9.2 mg/dL (ref 8.5–10.1)
Chloride: 105 mmol/L (ref 98–107)
Co2: 22 mmol/L (ref 21–32)
Creatinine: 0.69 mg/dL (ref 0.60–1.30)
EGFR (African American): >60
Glucose: 103 mg/dL — ABNORMAL HIGH (ref 65–99)
OSMOLALITY: 268 (ref 275–301)
POTASSIUM: 3.9 mmol/L (ref 3.5–5.1)
SODIUM: 134 mmol/L — AB (ref 136–145)

## 2013-11-01 LAB — CBC WITH DIFFERENTIAL/PLATELET
Basophil #: 0.1 10*3/uL (ref 0.0–0.1)
Basophil %: 1.4 %
EOS ABS: 0.1 10*3/uL (ref 0.0–0.7)
EOS PCT: 1.6 %
HCT: 33.4 % — AB (ref 35.0–47.0)
HGB: 11.4 g/dL — AB (ref 12.0–16.0)
LYMPHS PCT: 31.6 %
Lymphocyte #: 1.7 10*3/uL (ref 1.0–3.6)
MCH: 30.9 pg (ref 26.0–34.0)
MCHC: 34.3 g/dL (ref 32.0–36.0)
MCV: 90 fL (ref 80–100)
MONOS PCT: 14.1 %
Monocyte #: 0.8 x10 3/mm (ref 0.2–0.9)
Neutrophil #: 2.8 10*3/uL (ref 1.4–6.5)
Neutrophil %: 51.3 %
PLATELETS: 329 10*3/uL (ref 150–440)
RBC: 3.7 10*6/uL — AB (ref 3.80–5.20)
RDW: 18.6 % — AB (ref 11.5–14.5)
WBC: 5.4 10*3/uL (ref 3.6–11.0)

## 2013-11-01 LAB — URINE CULTURE

## 2013-11-02 LAB — BASIC METABOLIC PANEL
Anion Gap: 6 — ABNORMAL LOW (ref 7–16)
BUN: 7 mg/dL (ref 7–18)
Calcium, Total: 8.8 mg/dL (ref 8.5–10.1)
Chloride: 102 mmol/L (ref 98–107)
Co2: 24 mmol/L (ref 21–32)
Creatinine: 0.7 mg/dL (ref 0.60–1.30)
EGFR (African American): >60
EGFR (Non-African Amer.): >60
Glucose: 107 mg/dL — ABNORMAL HIGH (ref 65–99)
Osmolality: 263 (ref 275–301)
Potassium: 3.7 mmol/L (ref 3.5–5.1)
Sodium: 132 mmol/L — ABNORMAL LOW (ref 136–145)

## 2013-11-02 LAB — CBC WITH DIFFERENTIAL/PLATELET
Basophil #: 0.1 10*3/uL (ref 0.0–0.1)
Basophil %: 1.8 %
Eosinophil #: 0.2 10*3/uL (ref 0.0–0.7)
Eosinophil %: 3 %
HCT: 33.8 % — ABNORMAL LOW (ref 35.0–47.0)
HGB: 11.4 g/dL — ABNORMAL LOW (ref 12.0–16.0)
Lymphocyte #: 2 10*3/uL (ref 1.0–3.6)
Lymphocyte %: 30.5 %
MCH: 30.2 pg (ref 26.0–34.0)
MCHC: 33.6 g/dL (ref 32.0–36.0)
MCV: 90 fL (ref 80–100)
Monocyte #: 0.9 x10 3/mm (ref 0.2–0.9)
Monocyte %: 14 %
Neutrophil #: 3.3 10*3/uL (ref 1.4–6.5)
Neutrophil %: 50.7 %
Platelet: 344 10*3/uL (ref 150–440)
RBC: 3.76 10*6/uL — ABNORMAL LOW (ref 3.80–5.20)
RDW: 18.6 % — ABNORMAL HIGH (ref 11.5–14.5)
WBC: 6.4 10*3/uL (ref 3.6–11.0)

## 2013-11-04 LAB — BASIC METABOLIC PANEL
Anion Gap: 5 — ABNORMAL LOW (ref 7–16)
BUN: 11 mg/dL (ref 7–18)
CHLORIDE: 101 mmol/L (ref 98–107)
CO2: 26 mmol/L (ref 21–32)
Calcium, Total: 9.5 mg/dL (ref 8.5–10.1)
Creatinine: 0.7 mg/dL (ref 0.60–1.30)
EGFR (African American): >60
EGFR (Non-African Amer.): >60
Glucose: 127 mg/dL — ABNORMAL HIGH (ref 65–99)
Osmolality: 266 (ref 275–301)
Potassium: 3.8 mmol/L (ref 3.5–5.1)
Sodium: 132 mmol/L — ABNORMAL LOW (ref 136–145)

## 2013-11-07 DIAGNOSIS — K5732 Diverticulitis of large intestine without perforation or abscess without bleeding: Secondary | ICD-10-CM

## 2013-11-07 DIAGNOSIS — M519 Unspecified thoracic, thoracolumbar and lumbosacral intervertebral disc disorder: Secondary | ICD-10-CM

## 2013-11-07 DIAGNOSIS — E871 Hypo-osmolality and hyponatremia: Secondary | ICD-10-CM

## 2013-11-07 DIAGNOSIS — E039 Hypothyroidism, unspecified: Secondary | ICD-10-CM

## 2013-11-07 DIAGNOSIS — I10 Essential (primary) hypertension: Secondary | ICD-10-CM

## 2013-11-07 DIAGNOSIS — M5430 Sciatica, unspecified side: Secondary | ICD-10-CM

## 2013-12-05 ENCOUNTER — Inpatient Hospital Stay: Payer: Self-pay | Admitting: Internal Medicine

## 2013-12-05 ENCOUNTER — Ambulatory Visit: Payer: Self-pay | Admitting: Internal Medicine

## 2013-12-05 LAB — COMPREHENSIVE METABOLIC PANEL
ALBUMIN: 3.3 g/dL — AB (ref 3.4–5.0)
ALK PHOS: 70 U/L
ALT: 19 U/L (ref 12–78)
AST: 21 U/L (ref 15–37)
Anion Gap: 4 — ABNORMAL LOW (ref 7–16)
BUN: 8 mg/dL (ref 7–18)
Bilirubin,Total: 0.4 mg/dL (ref 0.2–1.0)
CALCIUM: 9.8 mg/dL (ref 8.5–10.1)
CHLORIDE: 97 mmol/L — AB (ref 98–107)
Co2: 28 mmol/L (ref 21–32)
Creatinine: 0.75 mg/dL (ref 0.60–1.30)
EGFR (African American): >60
EGFR (Non-African Amer.): >60
GLUCOSE: 113 mg/dL — AB (ref 65–99)
OSMOLALITY: 258 (ref 275–301)
Potassium: 3.4 mmol/L — ABNORMAL LOW (ref 3.5–5.1)
Sodium: 129 mmol/L — ABNORMAL LOW (ref 136–145)
Total Protein: 6.9 g/dL (ref 6.4–8.2)

## 2013-12-05 LAB — CBC WITH DIFFERENTIAL/PLATELET
Basophil #: 0.1 10*3/uL (ref 0.0–0.1)
Basophil %: 1 %
Eosinophil #: 0.2 10*3/uL (ref 0.0–0.7)
Eosinophil %: 1.5 %
HCT: 36.3 % (ref 35.0–47.0)
HGB: 12.4 g/dL (ref 12.0–16.0)
LYMPHS PCT: 19.4 %
Lymphocyte #: 1.9 10*3/uL (ref 1.0–3.6)
MCH: 31.5 pg (ref 26.0–34.0)
MCHC: 34.2 g/dL (ref 32.0–36.0)
MCV: 92 fL (ref 80–100)
Monocyte #: 1.3 x10 3/mm — ABNORMAL HIGH (ref 0.2–0.9)
Monocyte %: 12.9 %
NEUTROS ABS: 6.5 10*3/uL (ref 1.4–6.5)
Neutrophil %: 65.2 %
PLATELETS: 322 10*3/uL (ref 150–440)
RBC: 3.94 10*6/uL (ref 3.80–5.20)
RDW: 14.8 % — ABNORMAL HIGH (ref 11.5–14.5)
WBC: 9.9 10*3/uL (ref 3.6–11.0)

## 2013-12-07 LAB — CBC WITH DIFFERENTIAL/PLATELET
Basophil #: 0.1 10*3/uL (ref 0.0–0.1)
Basophil %: 1 %
EOS ABS: 0.2 10*3/uL (ref 0.0–0.7)
EOS PCT: 3 %
HCT: 34.9 % — AB (ref 35.0–47.0)
HGB: 11.6 g/dL — ABNORMAL LOW (ref 12.0–16.0)
Lymphocyte #: 2 10*3/uL (ref 1.0–3.6)
Lymphocyte %: 28.8 %
MCH: 30.8 pg (ref 26.0–34.0)
MCHC: 33.3 g/dL (ref 32.0–36.0)
MCV: 93 fL (ref 80–100)
MONO ABS: 0.9 x10 3/mm (ref 0.2–0.9)
Monocyte %: 13.1 %
Neutrophil #: 3.8 10*3/uL (ref 1.4–6.5)
Neutrophil %: 54.1 %
Platelet: 308 10*3/uL (ref 150–440)
RBC: 3.77 10*6/uL — AB (ref 3.80–5.20)
RDW: 14.5 % (ref 11.5–14.5)
WBC: 7 10*3/uL (ref 3.6–11.0)

## 2013-12-07 LAB — BASIC METABOLIC PANEL
ANION GAP: 3 — AB (ref 7–16)
BUN: 4 mg/dL — ABNORMAL LOW (ref 7–18)
CALCIUM: 9.7 mg/dL (ref 8.5–10.1)
Chloride: 105 mmol/L (ref 98–107)
Co2: 28 mmol/L (ref 21–32)
Creatinine: 0.62 mg/dL (ref 0.60–1.30)
EGFR (African American): >60
EGFR (Non-African Amer.): >60
GLUCOSE: 94 mg/dL (ref 65–99)
Osmolality: 269 (ref 275–301)
Potassium: 3.6 mmol/L (ref 3.5–5.1)
Sodium: 136 mmol/L (ref 136–145)

## 2013-12-08 LAB — BASIC METABOLIC PANEL
ANION GAP: 5 — AB (ref 7–16)
BUN: 4 mg/dL — ABNORMAL LOW (ref 7–18)
CALCIUM: 9.3 mg/dL (ref 8.5–10.1)
CO2: 28 mmol/L (ref 21–32)
CREATININE: 0.69 mg/dL (ref 0.60–1.30)
Chloride: 103 mmol/L (ref 98–107)
EGFR (African American): >60
EGFR (Non-African Amer.): >60
Glucose: 85 mg/dL (ref 65–99)
Osmolality: 268 (ref 275–301)
Potassium: 3.9 mmol/L (ref 3.5–5.1)
Sodium: 136 mmol/L (ref 136–145)

## 2013-12-08 LAB — CBC WITH DIFFERENTIAL/PLATELET
BASOS ABS: 0.1 10*3/uL (ref 0.0–0.1)
Basophil %: 1.1 %
Eosinophil #: 0.2 10*3/uL (ref 0.0–0.7)
Eosinophil %: 2.9 %
HCT: 35.9 % (ref 35.0–47.0)
HGB: 11.8 g/dL — AB (ref 12.0–16.0)
LYMPHS ABS: 2.3 10*3/uL (ref 1.0–3.6)
Lymphocyte %: 35.1 %
MCH: 30.6 pg (ref 26.0–34.0)
MCHC: 33 g/dL (ref 32.0–36.0)
MCV: 93 fL (ref 80–100)
Monocyte #: 0.9 x10 3/mm (ref 0.2–0.9)
Monocyte %: 14 %
Neutrophil #: 3.1 10*3/uL (ref 1.4–6.5)
Neutrophil %: 46.9 %
PLATELETS: 313 10*3/uL (ref 150–440)
RBC: 3.87 10*6/uL (ref 3.80–5.20)
RDW: 14.3 % (ref 11.5–14.5)
WBC: 6.5 10*3/uL (ref 3.6–11.0)

## 2013-12-10 LAB — CULTURE, BLOOD (SINGLE)

## 2014-02-04 ENCOUNTER — Ambulatory Visit: Payer: Self-pay | Admitting: Internal Medicine

## 2014-02-20 ENCOUNTER — Ambulatory Visit: Payer: Self-pay | Admitting: Family Medicine

## 2014-03-29 ENCOUNTER — Emergency Department: Payer: Self-pay | Admitting: Emergency Medicine

## 2014-03-29 LAB — COMPREHENSIVE METABOLIC PANEL
ALK PHOS: 73 U/L
ANION GAP: 7 (ref 7–16)
AST: 25 U/L (ref 15–37)
Albumin: 3.6 g/dL (ref 3.4–5.0)
BUN: 8 mg/dL (ref 7–18)
Bilirubin,Total: 0.4 mg/dL (ref 0.2–1.0)
Calcium, Total: 9.6 mg/dL (ref 8.5–10.1)
Chloride: 97 mmol/L — ABNORMAL LOW (ref 98–107)
Co2: 28 mmol/L (ref 21–32)
Creatinine: 0.83 mg/dL (ref 0.60–1.30)
EGFR (African American): >60
EGFR (Non-African Amer.): >60
Glucose: 95 mg/dL (ref 65–99)
OSMOLALITY: 263 (ref 275–301)
Potassium: 3.8 mmol/L (ref 3.5–5.1)
SGPT (ALT): 20 U/L (ref 12–78)
Sodium: 132 mmol/L — ABNORMAL LOW (ref 136–145)
Total Protein: 7 g/dL (ref 6.4–8.2)

## 2014-03-29 LAB — CBC WITH DIFFERENTIAL/PLATELET
BASOS PCT: 1.1 %
Basophil #: 0.1 10*3/uL (ref 0.0–0.1)
Eosinophil #: 0.2 10*3/uL (ref 0.0–0.7)
Eosinophil %: 1.9 %
HCT: 39.2 % (ref 35.0–47.0)
HGB: 12.7 g/dL (ref 12.0–16.0)
LYMPHS ABS: 1.9 10*3/uL (ref 1.0–3.6)
Lymphocyte %: 21.4 %
MCH: 29.2 pg (ref 26.0–34.0)
MCHC: 32.3 g/dL (ref 32.0–36.0)
MCV: 90 fL (ref 80–100)
MONO ABS: 1 x10 3/mm — AB (ref 0.2–0.9)
Monocyte %: 11.1 %
NEUTROS PCT: 64.5 %
Neutrophil #: 5.6 10*3/uL (ref 1.4–6.5)
Platelet: 423 10*3/uL (ref 150–440)
RBC: 4.34 10*6/uL (ref 3.80–5.20)
RDW: 14.5 % (ref 11.5–14.5)
WBC: 8.6 10*3/uL (ref 3.6–11.0)

## 2014-03-29 LAB — LIPASE, BLOOD: LIPASE: 157 U/L (ref 73–393)

## 2014-03-29 LAB — TROPONIN I

## 2014-04-02 ENCOUNTER — Inpatient Hospital Stay: Payer: Self-pay

## 2014-04-02 LAB — CBC
HCT: 37.3 % (ref 35.0–47.0)
HGB: 12.5 g/dL (ref 12.0–16.0)
MCH: 29.6 pg (ref 26.0–34.0)
MCHC: 33.4 g/dL (ref 32.0–36.0)
MCV: 89 fL (ref 80–100)
Platelet: 423 10*3/uL (ref 150–440)
RBC: 4.2 10*6/uL (ref 3.80–5.20)
RDW: 14.5 % (ref 11.5–14.5)
WBC: 8.3 10*3/uL (ref 3.6–11.0)

## 2014-04-02 LAB — URINALYSIS, COMPLETE
BLOOD: NEGATIVE
Bacteria: NONE SEEN
Bilirubin,UR: NEGATIVE
Glucose,UR: NEGATIVE mg/dL (ref 0–75)
Ketone: NEGATIVE
Leukocyte Esterase: NEGATIVE
Nitrite: NEGATIVE
PROTEIN: NEGATIVE
Ph: 7 (ref 4.5–8.0)
Specific Gravity: 1.016 (ref 1.003–1.030)
Squamous Epithelial: 1

## 2014-04-02 LAB — COMPREHENSIVE METABOLIC PANEL
AST: 20 U/L (ref 15–37)
Albumin: 3.6 g/dL (ref 3.4–5.0)
Alkaline Phosphatase: 68 U/L
Anion Gap: 8 (ref 7–16)
BILIRUBIN TOTAL: 0.5 mg/dL (ref 0.2–1.0)
BUN: 10 mg/dL (ref 7–18)
Calcium, Total: 9.8 mg/dL (ref 8.5–10.1)
Chloride: 91 mmol/L — ABNORMAL LOW (ref 98–107)
Co2: 24 mmol/L (ref 21–32)
Creatinine: 0.66 mg/dL (ref 0.60–1.30)
EGFR (African American): >60
GLUCOSE: 109 mg/dL — AB (ref 65–99)
Osmolality: 247 (ref 275–301)
Potassium: 3.9 mmol/L (ref 3.5–5.1)
SGPT (ALT): 20 U/L (ref 12–78)
SODIUM: 123 mmol/L — AB (ref 136–145)
Total Protein: 6.8 g/dL (ref 6.4–8.2)

## 2014-04-02 LAB — LIPASE, BLOOD: LIPASE: 172 U/L (ref 73–393)

## 2014-04-02 LAB — TROPONIN I: Troponin-I: <0.02 ng/mL

## 2014-04-03 LAB — CBC WITH DIFFERENTIAL/PLATELET
BASOS ABS: 0.1 10*3/uL (ref 0.0–0.1)
Basophil %: 0.8 %
EOS ABS: 0.1 10*3/uL (ref 0.0–0.7)
Eosinophil %: 1.3 %
HCT: 34.7 % — ABNORMAL LOW (ref 35.0–47.0)
HGB: 11.3 g/dL — ABNORMAL LOW (ref 12.0–16.0)
LYMPHS ABS: 1.3 10*3/uL (ref 1.0–3.6)
LYMPHS PCT: 20.4 %
MCH: 29.6 pg (ref 26.0–34.0)
MCHC: 32.7 g/dL (ref 32.0–36.0)
MCV: 91 fL (ref 80–100)
MONOS PCT: 10.7 %
Monocyte #: 0.7 x10 3/mm (ref 0.2–0.9)
NEUTROS ABS: 4.4 10*3/uL (ref 1.4–6.5)
Neutrophil %: 66.8 %
Platelet: 367 10*3/uL (ref 150–440)
RBC: 3.84 10*6/uL (ref 3.80–5.20)
RDW: 14.7 % — AB (ref 11.5–14.5)
WBC: 6.6 10*3/uL (ref 3.6–11.0)

## 2014-04-03 LAB — URINALYSIS, COMPLETE
Bacteria: NONE SEEN
Bilirubin,UR: NEGATIVE
Blood: NEGATIVE
Glucose,UR: NEGATIVE mg/dL (ref 0–75)
Leukocyte Esterase: NEGATIVE
Nitrite: NEGATIVE
PROTEIN: NEGATIVE
Ph: 5 (ref 4.5–8.0)
RBC,UR: 1 /HPF (ref 0–5)
Specific Gravity: 1.023 (ref 1.003–1.030)
Squamous Epithelial: <1
WBC UR: 1 /HPF (ref 0–5)

## 2014-04-03 LAB — CLOSTRIDIUM DIFFICILE(ARMC)

## 2014-04-03 LAB — BASIC METABOLIC PANEL
Anion Gap: 7 (ref 7–16)
BUN: 7 mg/dL (ref 7–18)
CHLORIDE: 99 mmol/L (ref 98–107)
CO2: 27 mmol/L (ref 21–32)
Calcium, Total: 8.8 mg/dL (ref 8.5–10.1)
Creatinine: 0.73 mg/dL (ref 0.60–1.30)
EGFR (Non-African Amer.): >60
Glucose: 87 mg/dL (ref 65–99)
Osmolality: 264 (ref 275–301)
Potassium: 4.2 mmol/L (ref 3.5–5.1)
Sodium: 133 mmol/L — ABNORMAL LOW (ref 136–145)

## 2014-04-03 LAB — MAGNESIUM: Magnesium: 2.2 mg/dL

## 2014-04-03 LAB — TSH: THYROID STIMULATING HORM: 6.57 u[IU]/mL — AB

## 2014-04-06 LAB — BASIC METABOLIC PANEL
ANION GAP: 4 — AB (ref 7–16)
BUN: 11 mg/dL (ref 7–18)
CALCIUM: 9.8 mg/dL (ref 8.5–10.1)
CREATININE: 0.72 mg/dL (ref 0.60–1.30)
Chloride: 96 mmol/L — ABNORMAL LOW (ref 98–107)
Co2: 31 mmol/L (ref 21–32)
Glucose: 140 mg/dL — ABNORMAL HIGH (ref 65–99)
Osmolality: 264 (ref 275–301)
Potassium: 4.1 mmol/L (ref 3.5–5.1)
Sodium: 131 mmol/L — ABNORMAL LOW (ref 136–145)

## 2014-04-07 LAB — BASIC METABOLIC PANEL
Anion Gap: 5 — ABNORMAL LOW (ref 7–16)
BUN: 14 mg/dL (ref 7–18)
CALCIUM: 9.6 mg/dL (ref 8.5–10.1)
CHLORIDE: 99 mmol/L (ref 98–107)
Co2: 29 mmol/L (ref 21–32)
Creatinine: 0.74 mg/dL (ref 0.60–1.30)
EGFR (African American): >60
EGFR (Non-African Amer.): >60
GLUCOSE: 106 mg/dL — AB (ref 65–99)
OSMOLALITY: 267 (ref 275–301)
Potassium: 4.5 mmol/L (ref 3.5–5.1)
Sodium: 133 mmol/L — ABNORMAL LOW (ref 136–145)

## 2014-05-11 ENCOUNTER — Inpatient Hospital Stay: Payer: Self-pay | Admitting: Internal Medicine

## 2014-05-11 LAB — URINALYSIS, COMPLETE
BLOOD: NEGATIVE
Bilirubin,UR: NEGATIVE
GLUCOSE, UR: NEGATIVE mg/dL (ref 0–75)
KETONE: NEGATIVE
LEUKOCYTE ESTERASE: NEGATIVE
Nitrite: NEGATIVE
Ph: 7 (ref 4.5–8.0)
Protein: NEGATIVE
RBC,UR: 1 /HPF (ref 0–5)
SPECIFIC GRAVITY: 1.012 (ref 1.003–1.030)

## 2014-05-11 LAB — COMPREHENSIVE METABOLIC PANEL
ALBUMIN: 3.3 g/dL — AB (ref 3.4–5.0)
ALK PHOS: 80 U/L
ANION GAP: 10 (ref 7–16)
AST: 24 U/L (ref 15–37)
BILIRUBIN TOTAL: 0.3 mg/dL (ref 0.2–1.0)
BUN: 10 mg/dL (ref 7–18)
CHLORIDE: 82 mmol/L — AB (ref 98–107)
Calcium, Total: 9.1 mg/dL (ref 8.5–10.1)
Co2: 25 mmol/L (ref 21–32)
Creatinine: 0.67 mg/dL (ref 0.60–1.30)
EGFR (African American): >60
EGFR (Non-African Amer.): >60
GLUCOSE: 112 mg/dL — AB (ref 65–99)
OSMOLALITY: 236 (ref 275–301)
POTASSIUM: 4.4 mmol/L (ref 3.5–5.1)
SGPT (ALT): 24 U/L (ref 12–78)
Sodium: 117 mmol/L — CL (ref 136–145)
Total Protein: 7 g/dL (ref 6.4–8.2)

## 2014-05-11 LAB — CBC WITH DIFFERENTIAL/PLATELET
Basophil #: 0.1 10*3/uL (ref 0.0–0.1)
Basophil %: 0.8 %
Eosinophil #: 0.1 10*3/uL (ref 0.0–0.7)
Eosinophil %: 0.7 %
HCT: 36.4 % (ref 35.0–47.0)
HGB: 12.1 g/dL (ref 12.0–16.0)
Lymphocyte #: 1.5 10*3/uL (ref 1.0–3.6)
Lymphocyte %: 14.5 %
MCH: 29.4 pg (ref 26.0–34.0)
MCHC: 33.3 g/dL (ref 32.0–36.0)
MCV: 88 fL (ref 80–100)
MONO ABS: 1.4 x10 3/mm — AB (ref 0.2–0.9)
Monocyte %: 14.2 %
NEUTROS ABS: 7.2 10*3/uL — AB (ref 1.4–6.5)
Neutrophil %: 69.8 %
PLATELETS: 423 10*3/uL (ref 150–440)
RBC: 4.11 10*6/uL (ref 3.80–5.20)
RDW: 14.3 % (ref 11.5–14.5)
WBC: 10.2 10*3/uL (ref 3.6–11.0)

## 2014-05-11 LAB — T4, FREE: FREE THYROXINE: 1.69 ng/dL — AB (ref 0.76–1.46)

## 2014-05-12 LAB — BASIC METABOLIC PANEL
ANION GAP: 9 (ref 7–16)
BUN: 9 mg/dL (ref 7–18)
CALCIUM: 8.7 mg/dL (ref 8.5–10.1)
CO2: 24 mmol/L (ref 21–32)
CREATININE: 0.67 mg/dL (ref 0.60–1.30)
Chloride: 95 mmol/L — ABNORMAL LOW (ref 98–107)
EGFR (African American): >60
Glucose: 82 mg/dL (ref 65–99)
OSMOLALITY: 255 (ref 275–301)
Potassium: 3.9 mmol/L (ref 3.5–5.1)
Sodium: 128 mmol/L — ABNORMAL LOW (ref 136–145)

## 2014-05-13 LAB — BASIC METABOLIC PANEL
ANION GAP: 8 (ref 7–16)
BUN: 7 mg/dL (ref 7–18)
Calcium, Total: 8.3 mg/dL — ABNORMAL LOW (ref 8.5–10.1)
Chloride: 96 mmol/L — ABNORMAL LOW (ref 98–107)
Co2: 24 mmol/L (ref 21–32)
Creatinine: 0.63 mg/dL (ref 0.60–1.30)
EGFR (Non-African Amer.): >60
Glucose: 81 mg/dL (ref 65–99)
Osmolality: 254 (ref 275–301)
Potassium: 3.7 mmol/L (ref 3.5–5.1)
Sodium: 128 mmol/L — ABNORMAL LOW (ref 136–145)

## 2014-05-13 LAB — TSH: Thyroid Stimulating Horm: 3.62 u[IU]/mL

## 2014-05-14 LAB — BASIC METABOLIC PANEL
Anion Gap: 8 (ref 7–16)
BUN: 9 mg/dL (ref 7–18)
CALCIUM: 8.6 mg/dL (ref 8.5–10.1)
CHLORIDE: 101 mmol/L (ref 98–107)
CO2: 24 mmol/L (ref 21–32)
Creatinine: 0.81 mg/dL (ref 0.60–1.30)
EGFR (African American): >60
EGFR (Non-African Amer.): >60
Glucose: 146 mg/dL — ABNORMAL HIGH (ref 65–99)
Osmolality: 268 (ref 275–301)
POTASSIUM: 3.6 mmol/L (ref 3.5–5.1)
Sodium: 133 mmol/L — ABNORMAL LOW (ref 136–145)

## 2014-05-14 LAB — URINALYSIS, COMPLETE
Bilirubin,UR: NEGATIVE
Glucose,UR: NEGATIVE mg/dL (ref 0–75)
KETONE: NEGATIVE
NITRITE: NEGATIVE
Ph: 7 (ref 4.5–8.0)
Protein: NEGATIVE
RBC,UR: 2 /HPF (ref 0–5)
Specific Gravity: 1.004 (ref 1.003–1.030)
Squamous Epithelial: <1
WBC UR: 72 /HPF (ref 0–5)

## 2014-05-15 LAB — BASIC METABOLIC PANEL
Anion Gap: 8 (ref 7–16)
BUN: 8 mg/dL (ref 7–18)
CREATININE: 0.75 mg/dL (ref 0.60–1.30)
Calcium, Total: 8.3 mg/dL — ABNORMAL LOW (ref 8.5–10.1)
Chloride: 96 mmol/L — ABNORMAL LOW (ref 98–107)
Co2: 26 mmol/L (ref 21–32)
EGFR (African American): >60
GLUCOSE: 92 mg/dL (ref 65–99)
Osmolality: 259 (ref 275–301)
POTASSIUM: 3.5 mmol/L (ref 3.5–5.1)
Sodium: 130 mmol/L — ABNORMAL LOW (ref 136–145)

## 2014-05-16 LAB — BASIC METABOLIC PANEL
Anion Gap: 8 (ref 7–16)
BUN: 7 mg/dL (ref 7–18)
CALCIUM: 8.7 mg/dL (ref 8.5–10.1)
Chloride: 97 mmol/L — ABNORMAL LOW (ref 98–107)
Co2: 28 mmol/L (ref 21–32)
Creatinine: 0.49 mg/dL — ABNORMAL LOW (ref 0.60–1.30)
EGFR (African American): >60
Glucose: 83 mg/dL (ref 65–99)
Osmolality: 263 (ref 275–301)
POTASSIUM: 3.3 mmol/L — AB (ref 3.5–5.1)
SODIUM: 133 mmol/L — AB (ref 136–145)

## 2014-05-17 LAB — BASIC METABOLIC PANEL
ANION GAP: 7 (ref 7–16)
BUN: 7 mg/dL (ref 7–18)
CALCIUM: 8.4 mg/dL — AB (ref 8.5–10.1)
CHLORIDE: 98 mmol/L (ref 98–107)
Co2: 28 mmol/L (ref 21–32)
Creatinine: 0.55 mg/dL — ABNORMAL LOW (ref 0.60–1.30)
EGFR (African American): >60
EGFR (Non-African Amer.): >60
Glucose: 87 mg/dL (ref 65–99)
OSMOLALITY: 264 (ref 275–301)
POTASSIUM: 3.5 mmol/L (ref 3.5–5.1)
Sodium: 133 mmol/L — ABNORMAL LOW (ref 136–145)

## 2014-05-17 LAB — URINE CULTURE

## 2014-05-18 LAB — BASIC METABOLIC PANEL
ANION GAP: 7 (ref 7–16)
BUN: 8 mg/dL (ref 7–18)
CREATININE: 0.67 mg/dL (ref 0.60–1.30)
Calcium, Total: 9.1 mg/dL (ref 8.5–10.1)
Chloride: 97 mmol/L — ABNORMAL LOW (ref 98–107)
Co2: 27 mmol/L (ref 21–32)
EGFR (African American): >60
GLUCOSE: 86 mg/dL (ref 65–99)
Osmolality: 260 (ref 275–301)
Potassium: 3.7 mmol/L (ref 3.5–5.1)
Sodium: 131 mmol/L — ABNORMAL LOW (ref 136–145)

## 2014-05-19 DIAGNOSIS — E2749 Other adrenocortical insufficiency: Secondary | ICD-10-CM

## 2014-05-19 DIAGNOSIS — IMO0002 Reserved for concepts with insufficient information to code with codable children: Secondary | ICD-10-CM

## 2014-05-19 DIAGNOSIS — R1110 Vomiting, unspecified: Secondary | ICD-10-CM

## 2014-05-19 DIAGNOSIS — E871 Hypo-osmolality and hyponatremia: Secondary | ICD-10-CM

## 2014-05-19 DIAGNOSIS — I10 Essential (primary) hypertension: Secondary | ICD-10-CM

## 2014-05-26 DIAGNOSIS — R609 Edema, unspecified: Secondary | ICD-10-CM

## 2014-06-04 DIAGNOSIS — E871 Hypo-osmolality and hyponatremia: Secondary | ICD-10-CM

## 2014-06-04 DIAGNOSIS — IMO0002 Reserved for concepts with insufficient information to code with codable children: Secondary | ICD-10-CM

## 2014-06-04 DIAGNOSIS — R1110 Vomiting, unspecified: Secondary | ICD-10-CM

## 2014-06-04 DIAGNOSIS — K219 Gastro-esophageal reflux disease without esophagitis: Secondary | ICD-10-CM

## 2014-06-04 DIAGNOSIS — I10 Essential (primary) hypertension: Secondary | ICD-10-CM

## 2014-06-24 ENCOUNTER — Inpatient Hospital Stay: Payer: Self-pay | Admitting: Internal Medicine

## 2014-06-24 DIAGNOSIS — E871 Hypo-osmolality and hyponatremia: Secondary | ICD-10-CM

## 2014-06-24 LAB — TROPONIN I
Troponin-I: 1.3 ng/mL — ABNORMAL HIGH
Troponin-I: 1.6 ng/mL — ABNORMAL HIGH
Troponin-I: 1.8 ng/mL — ABNORMAL HIGH

## 2014-06-24 LAB — PRO B NATRIURETIC PEPTIDE: B-Type Natriuretic Peptide: 4040 pg/mL — ABNORMAL HIGH

## 2014-06-24 LAB — PROTIME-INR
INR: 1.1
Prothrombin Time: 13.6 s

## 2014-06-24 LAB — BASIC METABOLIC PANEL
Anion Gap: 9 (ref 7–16)
BUN: 12 mg/dL (ref 7–18)
CREATININE: 0.87 mg/dL (ref 0.60–1.30)
Calcium, Total: 9 mg/dL (ref 8.5–10.1)
Chloride: 85 mmol/L — ABNORMAL LOW (ref 98–107)
Co2: 25 mmol/L (ref 21–32)
EGFR (Non-African Amer.): >60
GLUCOSE: 165 mg/dL — AB (ref 65–99)
Osmolality: 244 (ref 275–301)
Potassium: 4.3 mmol/L (ref 3.5–5.1)
Sodium: 119 mmol/L — CL (ref 136–145)

## 2014-06-24 LAB — CK-MB
CK-MB: 9.3 ng/mL — AB (ref 0.5–3.6)
CK-MB: 7.7 ng/mL — AB (ref 0.5–3.6)
CK-MB: 6.2 ng/mL — AB (ref 0.5–3.6)

## 2014-06-24 LAB — SODIUM: Sodium: 127 mmol/L — ABNORMAL LOW (ref 136–145)

## 2014-06-24 LAB — T4, FREE: FREE THYROXINE: 1.58 ng/dL — AB (ref 0.76–1.46)

## 2014-06-24 LAB — HEPARIN LEVEL (UNFRACTIONATED): Anti-Xa(Unfractionated): <0.1 IU/mL — ABNORMAL LOW (ref 0.30–0.70)

## 2014-06-24 LAB — CBC
HCT: 35.9 %
HGB: 11.6 g/dL — ABNORMAL LOW
MCH: 27.8 pg
MCHC: 32.2 g/dL
MCV: 86 fL
Platelet: 408 x10 3/mm 3
RBC: 4.16 X10 6/mm 3
RDW: 14.4 %
WBC: 12 x10 3/mm 3 — ABNORMAL HIGH

## 2014-06-24 LAB — HEMOGLOBIN A1C: Hemoglobin A1C: 6.1 %

## 2014-06-24 LAB — APTT: Activated PTT: 25.9 secs (ref 23.6–35.9)

## 2014-06-24 LAB — TSH: Thyroid Stimulating Horm: 4.55 u[IU]/mL — ABNORMAL HIGH

## 2014-06-25 LAB — CBC WITH DIFFERENTIAL/PLATELET
BASOS PCT: 0.8 %
Basophil #: 0.1 10*3/uL (ref 0.0–0.1)
Eosinophil #: 0.1 10*3/uL (ref 0.0–0.7)
Eosinophil %: 1.3 %
HCT: 31.7 % — AB (ref 35.0–47.0)
HGB: 10.1 g/dL — ABNORMAL LOW (ref 12.0–16.0)
LYMPHS PCT: 13.6 %
Lymphocyte #: 1.3 10*3/uL (ref 1.0–3.6)
MCH: 27.7 pg (ref 26.0–34.0)
MCHC: 31.9 g/dL — AB (ref 32.0–36.0)
MCV: 87 fL (ref 80–100)
Monocyte #: 1.1 x10 3/mm — ABNORMAL HIGH (ref 0.2–0.9)
Monocyte %: 11.8 %
NEUTROS PCT: 72.5 %
Neutrophil #: 6.7 10*3/uL — ABNORMAL HIGH (ref 1.4–6.5)
Platelet: 345 10*3/uL (ref 150–440)
RBC: 3.65 10*6/uL — AB (ref 3.80–5.20)
RDW: 14.3 % (ref 11.5–14.5)
WBC: 9.3 10*3/uL (ref 3.6–11.0)

## 2014-06-25 LAB — COMPREHENSIVE METABOLIC PANEL
ALT: 17 U/L
AST: 27 U/L (ref 15–37)
Albumin: 2.7 g/dL — ABNORMAL LOW (ref 3.4–5.0)
Alkaline Phosphatase: 57 U/L
Anion Gap: 5 — ABNORMAL LOW (ref 7–16)
BUN: 12 mg/dL (ref 7–18)
Bilirubin,Total: 0.3 mg/dL (ref 0.2–1.0)
CALCIUM: 8.4 mg/dL — AB (ref 8.5–10.1)
CO2: 26 mmol/L (ref 21–32)
Chloride: 96 mmol/L — ABNORMAL LOW (ref 98–107)
Creatinine: 0.69 mg/dL (ref 0.60–1.30)
EGFR (African American): >60
Glucose: 114 mg/dL — ABNORMAL HIGH (ref 65–99)
Osmolality: 256 (ref 275–301)
Potassium: 4.4 mmol/L (ref 3.5–5.1)
Sodium: 127 mmol/L — ABNORMAL LOW (ref 136–145)
TOTAL PROTEIN: 5.7 g/dL — AB (ref 6.4–8.2)

## 2014-06-25 LAB — HEPARIN LEVEL (UNFRACTIONATED)

## 2014-06-25 LAB — TSH: Thyroid Stimulating Horm: 1.23 u[IU]/mL

## 2014-06-25 LAB — T4, FREE: Free Thyroxine: 1.42 ng/dL (ref 0.76–1.46)

## 2014-06-26 LAB — CBC WITH DIFFERENTIAL/PLATELET
Basophil #: 0.1 10*3/uL (ref 0.0–0.1)
Basophil %: 1.1 %
EOS PCT: 1.1 %
Eosinophil #: 0.1 10*3/uL (ref 0.0–0.7)
HCT: 30.5 % — ABNORMAL LOW (ref 35.0–47.0)
HGB: 9.8 g/dL — ABNORMAL LOW (ref 12.0–16.0)
LYMPHS PCT: 21.3 %
Lymphocyte #: 1.6 10*3/uL (ref 1.0–3.6)
MCH: 27.9 pg (ref 26.0–34.0)
MCHC: 32 g/dL (ref 32.0–36.0)
MCV: 87 fL (ref 80–100)
Monocyte #: 1 x10 3/mm — ABNORMAL HIGH (ref 0.2–0.9)
Monocyte %: 12.8 %
NEUTROS ABS: 4.9 10*3/uL (ref 1.4–6.5)
Neutrophil %: 63.7 %
Platelet: 314 10*3/uL (ref 150–440)
RBC: 3.5 10*6/uL — ABNORMAL LOW (ref 3.80–5.20)
RDW: 14.6 % — AB (ref 11.5–14.5)
WBC: 7.7 10*3/uL (ref 3.6–11.0)

## 2014-06-26 LAB — BASIC METABOLIC PANEL
Anion Gap: 10 (ref 7–16)
BUN: 8 mg/dL (ref 7–18)
CALCIUM: 8.4 mg/dL — AB (ref 8.5–10.1)
CREATININE: 0.54 mg/dL — AB (ref 0.60–1.30)
Chloride: 93 mmol/L — ABNORMAL LOW (ref 98–107)
Co2: 23 mmol/L (ref 21–32)
EGFR (African American): >60
EGFR (Non-African Amer.): >60
Glucose: 93 mg/dL (ref 65–99)
OSMOLALITY: 251 (ref 275–301)
Potassium: 4 mmol/L (ref 3.5–5.1)
Sodium: 126 mmol/L — ABNORMAL LOW (ref 136–145)

## 2014-06-26 LAB — URINALYSIS, COMPLETE
BLOOD: NEGATIVE
Bilirubin,UR: NEGATIVE
GLUCOSE, UR: NEGATIVE mg/dL (ref 0–75)
Nitrite: NEGATIVE
Ph: 6 (ref 4.5–8.0)
Protein: NEGATIVE
RBC,UR: 5 /HPF (ref 0–5)
Specific Gravity: 1.013 (ref 1.003–1.030)
Squamous Epithelial: 3
WBC UR: 4 /HPF (ref 0–5)

## 2014-06-27 LAB — BASIC METABOLIC PANEL
ANION GAP: 8 (ref 7–16)
BUN: 8 mg/dL (ref 7–18)
Calcium, Total: 8.6 mg/dL (ref 8.5–10.1)
Chloride: 88 mmol/L — ABNORMAL LOW (ref 98–107)
Co2: 23 mmol/L (ref 21–32)
Creatinine: 0.48 mg/dL — ABNORMAL LOW (ref 0.60–1.30)
EGFR (Non-African Amer.): >60
Glucose: 111 mg/dL — ABNORMAL HIGH (ref 65–99)
Osmolality: 239 (ref 275–301)
POTASSIUM: 4 mmol/L (ref 3.5–5.1)
SODIUM: 119 mmol/L — AB (ref 136–145)

## 2014-06-27 LAB — URIC ACID: URIC ACID: 2.1 mg/dL — AB (ref 2.6–6.0)

## 2014-06-27 LAB — SODIUM
SODIUM: 127 mmol/L — AB (ref 136–145)
Sodium: 121 mmol/L — ABNORMAL LOW (ref 136–145)
Sodium: 126 mmol/L — ABNORMAL LOW (ref 136–145)

## 2014-06-27 LAB — OSMOLALITY: OSMOLALITY: 242 mosm/kg — AB (ref 280–301)

## 2014-06-28 LAB — BASIC METABOLIC PANEL
ANION GAP: 6 — AB (ref 7–16)
BUN: 7 mg/dL (ref 7–18)
CALCIUM: 8.5 mg/dL (ref 8.5–10.1)
CHLORIDE: 95 mmol/L — AB (ref 98–107)
Co2: 26 mmol/L (ref 21–32)
Creatinine: 0.6 mg/dL (ref 0.60–1.30)
EGFR (African American): >60
Glucose: 98 mg/dL (ref 65–99)
OSMOLALITY: 253 (ref 275–301)
Potassium: 3.6 mmol/L (ref 3.5–5.1)
Sodium: 127 mmol/L — ABNORMAL LOW (ref 136–145)

## 2014-06-28 LAB — SODIUM
SODIUM: 129 mmol/L — AB (ref 136–145)
Sodium: 130 mmol/L — ABNORMAL LOW (ref 136–145)
Sodium: 128 mmol/L — ABNORMAL LOW (ref 136–145)
Sodium: 134 mmol/L — ABNORMAL LOW (ref 136–145)

## 2014-06-28 LAB — OSMOLALITY, URINE: OSMOLALITY: 397 mosm/kg

## 2014-06-28 LAB — SODIUM, URINE, RANDOM: Sodium, Urine Random: 106 mmol/L (ref 20–110)

## 2014-06-28 LAB — URINE CULTURE

## 2014-06-29 LAB — SODIUM
SODIUM: 138 mmol/L (ref 136–145)
Sodium: 135 mmol/L — ABNORMAL LOW (ref 136–145)
Sodium: 134 mmol/L — ABNORMAL LOW (ref 136–145)

## 2014-06-29 LAB — CREATININE, SERUM: CREATININE: 0.64 mg/dL (ref 0.60–1.30)

## 2014-06-29 LAB — HEMOGLOBIN: HGB: 10.1 g/dL — ABNORMAL LOW (ref 12.0–16.0)

## 2014-06-30 LAB — BASIC METABOLIC PANEL
Anion Gap: 6 — ABNORMAL LOW (ref 7–16)
BUN: 9 mg/dL (ref 7–18)
CALCIUM: 9.1 mg/dL (ref 8.5–10.1)
CREATININE: 0.65 mg/dL (ref 0.60–1.30)
Chloride: 101 mmol/L (ref 98–107)
Co2: 32 mmol/L (ref 21–32)
EGFR (African American): >60
EGFR (Non-African Amer.): >60
GLUCOSE: 88 mg/dL (ref 65–99)
OSMOLALITY: 276 (ref 275–301)
POTASSIUM: 3.3 mmol/L — AB (ref 3.5–5.1)
Sodium: 139 mmol/L (ref 136–145)

## 2014-06-30 LAB — CBC WITH DIFFERENTIAL/PLATELET
BASOS PCT: 0.9 %
Basophil #: 0.1 10*3/uL (ref 0.0–0.1)
Eosinophil #: 0.2 10*3/uL (ref 0.0–0.7)
Eosinophil %: 2.2 %
HCT: 30.3 % — ABNORMAL LOW (ref 35.0–47.0)
HGB: 9.4 g/dL — AB (ref 12.0–16.0)
LYMPHS PCT: 23.4 %
Lymphocyte #: 1.9 10*3/uL (ref 1.0–3.6)
MCH: 27.5 pg (ref 26.0–34.0)
MCHC: 31.2 g/dL — ABNORMAL LOW (ref 32.0–36.0)
MCV: 88 fL (ref 80–100)
Monocyte #: 1 x10 3/mm — ABNORMAL HIGH (ref 0.2–0.9)
Monocyte %: 12.3 %
Neutrophil #: 4.9 10*3/uL (ref 1.4–6.5)
Neutrophil %: 61.2 %
Platelet: 396 10*3/uL (ref 150–440)
RBC: 3.43 10*6/uL — AB (ref 3.80–5.20)
RDW: 14.8 % — ABNORMAL HIGH (ref 11.5–14.5)
WBC: 8 10*3/uL (ref 3.6–11.0)

## 2014-06-30 LAB — URINALYSIS, COMPLETE
BILIRUBIN, UR: NEGATIVE
Bacteria: NONE SEEN
Glucose,UR: NEGATIVE mg/dL (ref 0–75)
Ketone: NEGATIVE
Nitrite: POSITIVE
Ph: 6 (ref 4.5–8.0)
SPECIFIC GRAVITY: 1.013 (ref 1.003–1.030)
Squamous Epithelial: NONE SEEN
WBC UR: 1070 /HPF (ref 0–5)

## 2014-07-01 LAB — BASIC METABOLIC PANEL
Anion Gap: 7 (ref 7–16)
BUN: 8 mg/dL (ref 7–18)
CREATININE: 0.61 mg/dL (ref 0.60–1.30)
Calcium, Total: 8.9 mg/dL (ref 8.5–10.1)
Chloride: 95 mmol/L — ABNORMAL LOW (ref 98–107)
Co2: 32 mmol/L (ref 21–32)
EGFR (African American): >60
Glucose: 98 mg/dL (ref 65–99)
Osmolality: 267 (ref 275–301)
Potassium: 3.4 mmol/L — ABNORMAL LOW (ref 3.5–5.1)
SODIUM: 134 mmol/L — AB (ref 136–145)

## 2014-07-01 LAB — CBC WITH DIFFERENTIAL/PLATELET
Basophil #: 0.1 10*3/uL (ref 0.0–0.1)
Basophil %: 1 %
EOS PCT: 1.5 %
Eosinophil #: 0.2 10*3/uL (ref 0.0–0.7)
HCT: 32.1 % — ABNORMAL LOW (ref 35.0–47.0)
HGB: 10.2 g/dL — AB (ref 12.0–16.0)
Lymphocyte #: 1.4 10*3/uL (ref 1.0–3.6)
Lymphocyte %: 13.4 %
MCH: 27.7 pg (ref 26.0–34.0)
MCHC: 31.9 g/dL — AB (ref 32.0–36.0)
MCV: 87 fL (ref 80–100)
MONOS PCT: 9.6 %
Monocyte #: 1 x10 3/mm — ABNORMAL HIGH (ref 0.2–0.9)
NEUTROS ABS: 7.9 10*3/uL — AB (ref 1.4–6.5)
NEUTROS PCT: 74.5 %
PLATELETS: 438 10*3/uL (ref 150–440)
RBC: 3.69 10*6/uL — AB (ref 3.80–5.20)
RDW: 15 % — AB (ref 11.5–14.5)
WBC: 10.6 10*3/uL (ref 3.6–11.0)

## 2014-07-02 LAB — CBC WITH DIFFERENTIAL/PLATELET
BASOS PCT: 0.6 %
Basophil #: 0.1 10*3/uL (ref 0.0–0.1)
EOS ABS: 0.2 10*3/uL (ref 0.0–0.7)
Eosinophil %: 2 %
HCT: 30.8 % — AB (ref 35.0–47.0)
HGB: 10.1 g/dL — AB (ref 12.0–16.0)
LYMPHS PCT: 20 %
Lymphocyte #: 1.9 10*3/uL (ref 1.0–3.6)
MCH: 28.2 pg (ref 26.0–34.0)
MCHC: 32.9 g/dL (ref 32.0–36.0)
MCV: 86 fL (ref 80–100)
Monocyte #: 0.8 x10 3/mm (ref 0.2–0.9)
Monocyte %: 8.3 %
NEUTROS PCT: 69.1 %
Neutrophil #: 6.6 10*3/uL — ABNORMAL HIGH (ref 1.4–6.5)
Platelet: 450 10*3/uL — ABNORMAL HIGH (ref 150–440)
RBC: 3.58 10*6/uL — AB (ref 3.80–5.20)
RDW: 14.6 % — AB (ref 11.5–14.5)
WBC: 9.6 10*3/uL (ref 3.6–11.0)

## 2014-07-02 LAB — BASIC METABOLIC PANEL
Anion Gap: 8 (ref 7–16)
BUN: 9 mg/dL (ref 7–18)
CALCIUM: 9.3 mg/dL (ref 8.5–10.1)
CHLORIDE: 94 mmol/L — AB (ref 98–107)
CO2: 32 mmol/L (ref 21–32)
CREATININE: 0.52 mg/dL — AB (ref 0.60–1.30)
EGFR (African American): >60
EGFR (Non-African Amer.): >60
GLUCOSE: 89 mg/dL (ref 65–99)
Osmolality: 266 (ref 275–301)
POTASSIUM: 3.3 mmol/L — AB (ref 3.5–5.1)
SODIUM: 134 mmol/L — AB (ref 136–145)

## 2014-07-03 LAB — URINE CULTURE

## 2014-08-02 ENCOUNTER — Observation Stay: Payer: Self-pay | Admitting: Internal Medicine

## 2014-08-02 LAB — COMPREHENSIVE METABOLIC PANEL
ALBUMIN: 3.4 g/dL (ref 3.4–5.0)
ALK PHOS: 111 U/L
ANION GAP: 6 — AB (ref 7–16)
AST: 24 U/L (ref 15–37)
BUN: 14 mg/dL (ref 7–18)
Bilirubin,Total: 0.3 mg/dL (ref 0.2–1.0)
CHLORIDE: 101 mmol/L (ref 98–107)
CREATININE: 0.73 mg/dL (ref 0.60–1.30)
Calcium, Total: 9.2 mg/dL (ref 8.5–10.1)
Co2: 29 mmol/L (ref 21–32)
EGFR (African American): >60
Glucose: 100 mg/dL — ABNORMAL HIGH (ref 65–99)
OSMOLALITY: 273 (ref 275–301)
POTASSIUM: 3.5 mmol/L (ref 3.5–5.1)
SGPT (ALT): 32 U/L
Sodium: 136 mmol/L (ref 136–145)
TOTAL PROTEIN: 6.8 g/dL (ref 6.4–8.2)

## 2014-08-02 LAB — TROPONIN I

## 2014-08-02 LAB — CBC
HCT: 36.6 % (ref 35.0–47.0)
HGB: 11.2 g/dL — ABNORMAL LOW (ref 12.0–16.0)
MCH: 25.6 pg — ABNORMAL LOW (ref 26.0–34.0)
MCHC: 30.7 g/dL — ABNORMAL LOW (ref 32.0–36.0)
MCV: 83 fL (ref 80–100)
PLATELETS: 345 10*3/uL (ref 150–440)
RBC: 4.4 10*6/uL (ref 3.80–5.20)
RDW: 16.6 % — ABNORMAL HIGH (ref 11.5–14.5)
WBC: 10 10*3/uL (ref 3.6–11.0)

## 2014-08-02 LAB — PRO B NATRIURETIC PEPTIDE: B-TYPE NATIURETIC PEPTID: 1043 pg/mL — AB (ref 0–450)

## 2014-08-18 ENCOUNTER — Inpatient Hospital Stay: Payer: Self-pay | Admitting: Internal Medicine

## 2014-08-18 LAB — CREATININE, SERUM
Creatinine: 0.7 mg/dL (ref 0.60–1.30)
EGFR (African American): >60
EGFR (Non-African Amer.): >60

## 2014-08-18 LAB — HEMOGLOBIN: HGB: 10.2 g/dL — ABNORMAL LOW (ref 12.0–16.0)

## 2014-08-19 LAB — URINALYSIS, COMPLETE
BACTERIA: NONE SEEN
Bilirubin,UR: NEGATIVE
Blood: NEGATIVE
GLUCOSE, UR: NEGATIVE mg/dL (ref 0–75)
Nitrite: NEGATIVE
Ph: 6 (ref 4.5–8.0)
Protein: NEGATIVE
RBC,UR: 2 /HPF (ref 0–5)
SPECIFIC GRAVITY: 1.014 (ref 1.003–1.030)
WBC UR: 451 /HPF (ref 0–5)

## 2014-08-19 LAB — COMPREHENSIVE METABOLIC PANEL
ALK PHOS: 72 U/L
ALT: 20 U/L
AST: 19 U/L (ref 15–37)
Albumin: 2.5 g/dL — ABNORMAL LOW (ref 3.4–5.0)
BILIRUBIN TOTAL: 0.3 mg/dL (ref 0.2–1.0)
Total Protein: 5.5 g/dL — ABNORMAL LOW (ref 6.4–8.2)

## 2014-08-19 LAB — CBC WITH DIFFERENTIAL/PLATELET
BASOS ABS: 0.1 10*3/uL (ref 0.0–0.1)
Basophil %: 0.8 %
Eosinophil #: 0.1 10*3/uL (ref 0.0–0.7)
Eosinophil %: 1.8 %
HCT: 30.3 % — ABNORMAL LOW (ref 35.0–47.0)
HGB: 9.4 g/dL — ABNORMAL LOW (ref 12.0–16.0)
Lymphocyte #: 1.3 10*3/uL (ref 1.0–3.6)
Lymphocyte %: 20.3 %
MCH: 25.6 pg — AB (ref 26.0–34.0)
MCHC: 30.9 g/dL — ABNORMAL LOW (ref 32.0–36.0)
MCV: 83 fL (ref 80–100)
Monocyte #: 0.8 x10 3/mm (ref 0.2–0.9)
Monocyte %: 12.4 %
NEUTROS PCT: 64.7 %
Neutrophil #: 4.1 10*3/uL (ref 1.4–6.5)
PLATELETS: 353 10*3/uL (ref 150–440)
RBC: 3.66 10*6/uL — ABNORMAL LOW (ref 3.80–5.20)
RDW: 17 % — AB (ref 11.5–14.5)
WBC: 6.3 10*3/uL (ref 3.6–11.0)

## 2014-08-19 LAB — BASIC METABOLIC PANEL
ANION GAP: 9 (ref 7–16)
BUN: 10 mg/dL (ref 7–18)
CHLORIDE: 95 mmol/L — AB (ref 98–107)
CO2: 25 mmol/L (ref 21–32)
Calcium, Total: 8.1 mg/dL — ABNORMAL LOW (ref 8.5–10.1)
Creatinine: 0.65 mg/dL (ref 0.60–1.30)
Glucose: 79 mg/dL (ref 65–99)
OSMOLALITY: 257 (ref 275–301)
POTASSIUM: 3.6 mmol/L (ref 3.5–5.1)
Sodium: 129 mmol/L — ABNORMAL LOW (ref 136–145)

## 2014-08-19 LAB — SEDIMENTATION RATE: Erythrocyte Sed Rate: 35 mm/hr — ABNORMAL HIGH (ref 0–30)

## 2014-08-19 LAB — TSH: THYROID STIMULATING HORM: 3.74 u[IU]/mL

## 2014-08-20 LAB — BASIC METABOLIC PANEL
ANION GAP: 8 (ref 7–16)
BUN: 6 mg/dL — ABNORMAL LOW (ref 7–18)
CO2: 25 mmol/L (ref 21–32)
Calcium, Total: 8.8 mg/dL (ref 8.5–10.1)
Chloride: 98 mmol/L (ref 98–107)
Creatinine: 0.67 mg/dL (ref 0.60–1.30)
EGFR (African American): >60
EGFR (Non-African Amer.): >60
GLUCOSE: 180 mg/dL — AB (ref 65–99)
OSMOLALITY: 265 (ref 275–301)
Potassium: 3.8 mmol/L (ref 3.5–5.1)
SODIUM: 131 mmol/L — AB (ref 136–145)

## 2014-08-21 LAB — CBC WITH DIFFERENTIAL/PLATELET
BASOS ABS: 0 10*3/uL (ref 0.0–0.1)
Basophil %: 0.5 %
EOS ABS: 0 10*3/uL (ref 0.0–0.7)
Eosinophil %: 0.5 %
HCT: 28.9 % — ABNORMAL LOW (ref 35.0–47.0)
HGB: 9.3 g/dL — ABNORMAL LOW (ref 12.0–16.0)
Lymphocyte #: 1.5 10*3/uL (ref 1.0–3.6)
Lymphocyte %: 19.9 %
MCH: 26.5 pg (ref 26.0–34.0)
MCHC: 32.1 g/dL (ref 32.0–36.0)
MCV: 82 fL (ref 80–100)
MONOS PCT: 10.9 %
Monocyte #: 0.8 x10 3/mm (ref 0.2–0.9)
NEUTROS ABS: 5.2 10*3/uL (ref 1.4–6.5)
NEUTROS PCT: 68.2 %
Platelet: 412 10*3/uL (ref 150–440)
RBC: 3.51 10*6/uL — ABNORMAL LOW (ref 3.80–5.20)
RDW: 17.3 % — AB (ref 11.5–14.5)
WBC: 7.7 10*3/uL (ref 3.6–11.0)

## 2014-08-21 LAB — BASIC METABOLIC PANEL
Anion Gap: 8 (ref 7–16)
BUN: 7 mg/dL (ref 7–18)
CHLORIDE: 100 mmol/L (ref 98–107)
Calcium, Total: 8.4 mg/dL — ABNORMAL LOW (ref 8.5–10.1)
Co2: 25 mmol/L (ref 21–32)
Creatinine: 0.64 mg/dL (ref 0.60–1.30)
EGFR (Non-African Amer.): >60
GLUCOSE: 99 mg/dL (ref 65–99)
OSMOLALITY: 264 (ref 275–301)
POTASSIUM: 3.3 mmol/L — AB (ref 3.5–5.1)
Sodium: 133 mmol/L — ABNORMAL LOW (ref 136–145)

## 2014-08-25 ENCOUNTER — Ambulatory Visit: Payer: Self-pay | Admitting: Physical Medicine and Rehabilitation

## 2014-09-08 ENCOUNTER — Emergency Department: Payer: Self-pay | Admitting: Emergency Medicine

## 2014-09-08 LAB — CBC WITH DIFFERENTIAL/PLATELET
Basophil #: 0.1 10*3/uL (ref 0.0–0.1)
Basophil %: 0.8 %
Eosinophil #: 0.2 10*3/uL (ref 0.0–0.7)
Eosinophil %: 2.3 %
HCT: 35.9 % (ref 35.0–47.0)
HGB: 11.2 g/dL — ABNORMAL LOW (ref 12.0–16.0)
LYMPHS PCT: 16.9 %
Lymphocyte #: 1.3 10*3/uL (ref 1.0–3.6)
MCH: 25.8 pg — AB (ref 26.0–34.0)
MCHC: 31.1 g/dL — AB (ref 32.0–36.0)
MCV: 83 fL (ref 80–100)
Monocyte #: 1.1 x10 3/mm — ABNORMAL HIGH (ref 0.2–0.9)
Monocyte %: 14.4 %
NEUTROS PCT: 65.6 %
Neutrophil #: 5.2 10*3/uL (ref 1.4–6.5)
PLATELETS: 475 10*3/uL — AB (ref 150–440)
RBC: 4.34 10*6/uL (ref 3.80–5.20)
RDW: 17.7 % — AB (ref 11.5–14.5)
WBC: 7.9 10*3/uL (ref 3.6–11.0)

## 2014-09-08 LAB — COMPREHENSIVE METABOLIC PANEL
ALBUMIN: 3.1 g/dL — AB (ref 3.4–5.0)
Alkaline Phosphatase: 127 U/L — ABNORMAL HIGH
Anion Gap: 2 — ABNORMAL LOW (ref 7–16)
BUN: 16 mg/dL (ref 7–18)
Bilirubin,Total: 0.3 mg/dL (ref 0.2–1.0)
CALCIUM: 9.2 mg/dL (ref 8.5–10.1)
CHLORIDE: 97 mmol/L — AB (ref 98–107)
Co2: 31 mmol/L (ref 21–32)
Creatinine: 0.92 mg/dL (ref 0.60–1.30)
EGFR (African American): >60
EGFR (Non-African Amer.): >60
Glucose: 96 mg/dL (ref 65–99)
Osmolality: 262 (ref 275–301)
Potassium: 4.7 mmol/L (ref 3.5–5.1)
SGOT(AST): 20 U/L (ref 15–37)
SGPT (ALT): 35 U/L
Sodium: 130 mmol/L — ABNORMAL LOW (ref 136–145)
TOTAL PROTEIN: 6.2 g/dL — AB (ref 6.4–8.2)

## 2014-09-08 LAB — URINALYSIS, COMPLETE
Bilirubin,UR: NEGATIVE
Blood: NEGATIVE
Glucose,UR: NEGATIVE mg/dL (ref 0–75)
KETONE: NEGATIVE
NITRITE: NEGATIVE
PROTEIN: NEGATIVE
Ph: 5 (ref 4.5–8.0)
RBC,UR: 5 /HPF (ref 0–5)
Renal Epithelial: <1
Specific Gravity: 1.011 (ref 1.003–1.030)
Squamous Epithelial: 21
Transitional Epi: 9
WBC UR: 49 /HPF (ref 0–5)

## 2014-09-08 LAB — TROPONIN I

## 2014-09-08 LAB — LIPASE, BLOOD: Lipase: 125 U/L (ref 73–393)

## 2014-09-10 LAB — URINE CULTURE

## 2014-09-11 ENCOUNTER — Emergency Department: Payer: Self-pay | Admitting: Internal Medicine

## 2014-09-11 LAB — COMPREHENSIVE METABOLIC PANEL
ALBUMIN: 2.9 g/dL — AB (ref 3.4–5.0)
ALT: 37 U/L
AST: 32 U/L (ref 15–37)
Alkaline Phosphatase: 116 U/L
Anion Gap: 5 — ABNORMAL LOW (ref 7–16)
BILIRUBIN TOTAL: 0.5 mg/dL (ref 0.2–1.0)
BUN: 14 mg/dL (ref 7–18)
CALCIUM: 8.5 mg/dL (ref 8.5–10.1)
CO2: 27 mmol/L (ref 21–32)
Chloride: 100 mmol/L (ref 98–107)
Creatinine: 0.93 mg/dL (ref 0.60–1.30)
GLUCOSE: 89 mg/dL (ref 65–99)
Osmolality: 264 (ref 275–301)
POTASSIUM: 4.2 mmol/L (ref 3.5–5.1)
Sodium: 132 mmol/L — ABNORMAL LOW (ref 136–145)
Total Protein: 6 g/dL — ABNORMAL LOW (ref 6.4–8.2)

## 2014-09-11 LAB — URINALYSIS, COMPLETE
BACTERIA: NONE SEEN
Specific Gravity: 1.016 (ref 1.003–1.030)
Squamous Epithelial: NONE SEEN
WBC UR: 2 /HPF (ref 0–5)

## 2014-09-11 LAB — CBC
HCT: 34.9 % — ABNORMAL LOW (ref 35.0–47.0)
HGB: 11.1 g/dL — AB (ref 12.0–16.0)
MCH: 26.1 pg (ref 26.0–34.0)
MCHC: 31.6 g/dL — ABNORMAL LOW (ref 32.0–36.0)
MCV: 83 fL (ref 80–100)
PLATELETS: 424 10*3/uL (ref 150–440)
RBC: 4.24 10*6/uL (ref 3.80–5.20)
RDW: 18.9 % — AB (ref 11.5–14.5)
WBC: 8 10*3/uL (ref 3.6–11.0)

## 2014-09-11 LAB — LIPASE, BLOOD: Lipase: 99 U/L (ref 73–393)

## 2014-09-11 LAB — TROPONIN I

## 2014-09-14 ENCOUNTER — Inpatient Hospital Stay: Payer: Self-pay | Admitting: Internal Medicine

## 2014-09-14 LAB — COMPREHENSIVE METABOLIC PANEL
ALK PHOS: 141 U/L — AB
ALT: 41 U/L
Albumin: 2.8 g/dL — ABNORMAL LOW (ref 3.4–5.0)
Anion Gap: 9 (ref 7–16)
BUN: 21 mg/dL — AB (ref 7–18)
Bilirubin,Total: 0.3 mg/dL (ref 0.2–1.0)
Calcium, Total: 8.5 mg/dL (ref 8.5–10.1)
Chloride: 101 mmol/L (ref 98–107)
Co2: 25 mmol/L (ref 21–32)
Creatinine: 1.01 mg/dL (ref 0.60–1.30)
EGFR (African American): >60
GFR CALC NON AF AMER: 56 — AB
Glucose: 114 mg/dL — ABNORMAL HIGH (ref 65–99)
Osmolality: 274 (ref 275–301)
POTASSIUM: 4.1 mmol/L (ref 3.5–5.1)
SGOT(AST): 29 U/L (ref 15–37)
Sodium: 135 mmol/L — ABNORMAL LOW (ref 136–145)
Total Protein: 5.9 g/dL — ABNORMAL LOW (ref 6.4–8.2)

## 2014-09-14 LAB — URINALYSIS, COMPLETE
BILIRUBIN, UR: NEGATIVE
Bacteria: NONE SEEN
GLUCOSE, UR: NEGATIVE mg/dL (ref 0–75)
Ketone: NEGATIVE
Leukocyte Esterase: NEGATIVE
Nitrite: POSITIVE
Ph: 6 (ref 4.5–8.0)
Protein: 30
RBC,UR: 30 /HPF (ref 0–5)
SQUAMOUS EPITHELIAL: NONE SEEN
Specific Gravity: 1.006 (ref 1.003–1.030)
WBC UR: <1 /HPF (ref 0–5)

## 2014-09-14 LAB — CBC
HCT: 35.6 % (ref 35.0–47.0)
HGB: 11.1 g/dL — ABNORMAL LOW (ref 12.0–16.0)
MCH: 26 pg (ref 26.0–34.0)
MCHC: 31.2 g/dL — ABNORMAL LOW (ref 32.0–36.0)
MCV: 83 fL (ref 80–100)
PLATELETS: 368 10*3/uL (ref 150–440)
RBC: 4.28 10*6/uL (ref 3.80–5.20)
RDW: 19.3 % — ABNORMAL HIGH (ref 11.5–14.5)
WBC: 10.3 10*3/uL (ref 3.6–11.0)

## 2014-09-14 LAB — TROPONIN I

## 2014-09-14 LAB — CK TOTAL AND CKMB (NOT AT ARMC)
CK, Total: 96 U/L (ref 26–192)
CK-MB: 2.9 ng/mL (ref 0.5–3.6)

## 2014-09-14 LAB — LIPASE, BLOOD: Lipase: 87 U/L (ref 73–393)

## 2014-09-15 LAB — CBC WITH DIFFERENTIAL/PLATELET
BASOS PCT: 1.2 %
Basophil #: 0.1 10*3/uL (ref 0.0–0.1)
Eosinophil #: 0.2 10*3/uL (ref 0.0–0.7)
Eosinophil %: 3.6 %
HCT: 32 % — ABNORMAL LOW (ref 35.0–47.0)
HGB: 9.9 g/dL — AB (ref 12.0–16.0)
Lymphocyte #: 1.3 10*3/uL (ref 1.0–3.6)
Lymphocyte %: 18.3 %
MCH: 26 pg (ref 26.0–34.0)
MCHC: 30.8 g/dL — AB (ref 32.0–36.0)
MCV: 84 fL (ref 80–100)
Monocyte #: 1.1 x10 3/mm — ABNORMAL HIGH (ref 0.2–0.9)
Monocyte %: 16.2 %
NEUTROS PCT: 60.7 %
Neutrophil #: 4.2 10*3/uL (ref 1.4–6.5)
Platelet: 319 10*3/uL (ref 150–440)
RBC: 3.79 10*6/uL — ABNORMAL LOW (ref 3.80–5.20)
RDW: 19.1 % — ABNORMAL HIGH (ref 11.5–14.5)
WBC: 6.9 10*3/uL (ref 3.6–11.0)

## 2014-09-15 LAB — BASIC METABOLIC PANEL
Anion Gap: 7 (ref 7–16)
BUN: 18 mg/dL (ref 7–18)
CREATININE: 1.05 mg/dL (ref 0.60–1.30)
Calcium, Total: 7.9 mg/dL — ABNORMAL LOW (ref 8.5–10.1)
Chloride: 104 mmol/L (ref 98–107)
Co2: 26 mmol/L (ref 21–32)
EGFR (African American): >60
GFR CALC NON AF AMER: 53 — AB
Glucose: 75 mg/dL (ref 65–99)
Osmolality: 274 (ref 275–301)
Potassium: 3.9 mmol/L (ref 3.5–5.1)
Sodium: 137 mmol/L (ref 136–145)

## 2014-09-15 LAB — MAGNESIUM: Magnesium: 2.5 mg/dL — ABNORMAL HIGH

## 2014-09-16 LAB — CBC WITH DIFFERENTIAL/PLATELET
BASOS ABS: 0.1 10*3/uL (ref 0.0–0.1)
BASOS PCT: 1.3 %
Eosinophil #: 0.2 10*3/uL (ref 0.0–0.7)
Eosinophil %: 3.3 %
HCT: 30.4 % — AB (ref 35.0–47.0)
HGB: 9.3 g/dL — ABNORMAL LOW (ref 12.0–16.0)
Lymphocyte #: 1.2 10*3/uL (ref 1.0–3.6)
Lymphocyte %: 21.8 %
MCH: 26 pg (ref 26.0–34.0)
MCHC: 30.5 g/dL — ABNORMAL LOW (ref 32.0–36.0)
MCV: 85 fL (ref 80–100)
Monocyte #: 0.7 x10 3/mm (ref 0.2–0.9)
Monocyte %: 12.1 %
Neutrophil #: 3.4 10*3/uL (ref 1.4–6.5)
Neutrophil %: 61.5 %
Platelet: 280 10*3/uL (ref 150–440)
RBC: 3.57 10*6/uL — AB (ref 3.80–5.20)
RDW: 19.7 % — ABNORMAL HIGH (ref 11.5–14.5)
WBC: 5.6 10*3/uL (ref 3.6–11.0)

## 2014-09-16 LAB — BASIC METABOLIC PANEL
ANION GAP: 6 — AB (ref 7–16)
BUN: 15 mg/dL (ref 7–18)
CO2: 25 mmol/L (ref 21–32)
Calcium, Total: 8.4 mg/dL — ABNORMAL LOW (ref 8.5–10.1)
Chloride: 106 mmol/L (ref 98–107)
Creatinine: 0.9 mg/dL (ref 0.60–1.30)
EGFR (Non-African Amer.): >60
Glucose: 91 mg/dL (ref 65–99)
OSMOLALITY: 274 (ref 275–301)
POTASSIUM: 4.4 mmol/L (ref 3.5–5.1)
Sodium: 137 mmol/L (ref 136–145)

## 2014-09-16 LAB — URINE CULTURE

## 2014-09-17 LAB — CBC WITH DIFFERENTIAL/PLATELET
Basophil #: 0.1 10*3/uL (ref 0.0–0.1)
Basophil %: 1.3 %
EOS ABS: 0.1 10*3/uL (ref 0.0–0.7)
EOS PCT: 2.6 %
HCT: 28.7 % — ABNORMAL LOW (ref 35.0–47.0)
HGB: 8.9 g/dL — ABNORMAL LOW (ref 12.0–16.0)
LYMPHS PCT: 21.7 %
Lymphocyte #: 1.2 10*3/uL (ref 1.0–3.6)
MCH: 26.2 pg (ref 26.0–34.0)
MCHC: 30.9 g/dL — ABNORMAL LOW (ref 32.0–36.0)
MCV: 85 fL (ref 80–100)
MONO ABS: 0.6 x10 3/mm (ref 0.2–0.9)
MONOS PCT: 11.1 %
NEUTROS ABS: 3.5 10*3/uL (ref 1.4–6.5)
Neutrophil %: 63.3 %
PLATELETS: 275 10*3/uL (ref 150–440)
RBC: 3.39 10*6/uL — ABNORMAL LOW (ref 3.80–5.20)
RDW: 19.5 % — ABNORMAL HIGH (ref 11.5–14.5)
WBC: 5.6 10*3/uL (ref 3.6–11.0)

## 2014-09-19 LAB — BASIC METABOLIC PANEL
Anion Gap: 6 — ABNORMAL LOW (ref 7–16)
BUN: 15 mg/dL (ref 7–18)
CHLORIDE: 106 mmol/L (ref 98–107)
CO2: 27 mmol/L (ref 21–32)
Calcium, Total: 8.6 mg/dL (ref 8.5–10.1)
Creatinine: 1.06 mg/dL (ref 0.60–1.30)
EGFR (African American): >60
EGFR (Non-African Amer.): 53 — ABNORMAL LOW
GLUCOSE: 99 mg/dL (ref 65–99)
OSMOLALITY: 278 (ref 275–301)
Potassium: 4 mmol/L (ref 3.5–5.1)
Sodium: 139 mmol/L (ref 136–145)

## 2014-09-19 LAB — CBC WITH DIFFERENTIAL/PLATELET
Basophil #: 0.1 10*3/uL (ref 0.0–0.1)
Basophil %: 0.8 %
EOS ABS: 0.1 10*3/uL (ref 0.0–0.7)
Eosinophil %: 1.6 %
HCT: 29.6 % — AB (ref 35.0–47.0)
HGB: 9.1 g/dL — AB (ref 12.0–16.0)
LYMPHS ABS: 1.1 10*3/uL (ref 1.0–3.6)
Lymphocyte %: 16 %
MCH: 25.9 pg — ABNORMAL LOW (ref 26.0–34.0)
MCHC: 30.6 g/dL — ABNORMAL LOW (ref 32.0–36.0)
MCV: 85 fL (ref 80–100)
Monocyte #: 0.9 x10 3/mm (ref 0.2–0.9)
Monocyte %: 13.2 %
NEUTROS PCT: 68.4 %
Neutrophil #: 4.7 10*3/uL (ref 1.4–6.5)
Platelet: 244 10*3/uL (ref 150–440)
RBC: 3.5 10*6/uL — AB (ref 3.80–5.20)
RDW: 20.6 % — ABNORMAL HIGH (ref 11.5–14.5)
WBC: 6.9 10*3/uL (ref 3.6–11.0)

## 2014-09-21 LAB — BASIC METABOLIC PANEL
Anion Gap: 8 (ref 7–16)
BUN: 22 mg/dL — AB (ref 7–18)
CALCIUM: 8.6 mg/dL (ref 8.5–10.1)
CREATININE: 1.24 mg/dL (ref 0.60–1.30)
Chloride: 108 mmol/L — ABNORMAL HIGH (ref 98–107)
Co2: 28 mmol/L (ref 21–32)
GFR CALC AF AMER: 53 — AB
GFR CALC NON AF AMER: 44 — AB
GLUCOSE: 110 mg/dL — AB (ref 65–99)
Osmolality: 291 (ref 275–301)
Potassium: 4.1 mmol/L (ref 3.5–5.1)
Sodium: 144 mmol/L (ref 136–145)

## 2014-09-21 LAB — CBC WITH DIFFERENTIAL/PLATELET
BASOS PCT: 0.6 %
Basophil #: 0 10*3/uL (ref 0.0–0.1)
EOS ABS: 0.1 10*3/uL (ref 0.0–0.7)
Eosinophil %: 1.3 %
HCT: 30.5 % — ABNORMAL LOW (ref 35.0–47.0)
HGB: 9.6 g/dL — ABNORMAL LOW (ref 12.0–16.0)
Lymphocyte #: 1.2 10*3/uL (ref 1.0–3.6)
Lymphocyte %: 17.3 %
MCH: 26.9 pg (ref 26.0–34.0)
MCHC: 31.6 g/dL — AB (ref 32.0–36.0)
MCV: 85 fL (ref 80–100)
Monocyte #: 1 x10 3/mm — ABNORMAL HIGH (ref 0.2–0.9)
Monocyte %: 14.1 %
Neutrophil #: 4.8 10*3/uL (ref 1.4–6.5)
Neutrophil %: 66.7 %
PLATELETS: 296 10*3/uL (ref 150–440)
RBC: 3.58 10*6/uL — ABNORMAL LOW (ref 3.80–5.20)
RDW: 21.8 % — AB (ref 11.5–14.5)
WBC: 7.2 10*3/uL (ref 3.6–11.0)

## 2014-09-23 LAB — CBC WITH DIFFERENTIAL/PLATELET
BASOS PCT: 0.5 %
Basophil #: 0 10*3/uL (ref 0.0–0.1)
EOS ABS: 0 10*3/uL (ref 0.0–0.7)
EOS PCT: 0.6 %
HCT: 30.1 % — AB (ref 35.0–47.0)
HGB: 9.4 g/dL — ABNORMAL LOW (ref 12.0–16.0)
Lymphocyte #: 1.2 10*3/uL (ref 1.0–3.6)
Lymphocyte %: 17 %
MCH: 26.6 pg (ref 26.0–34.0)
MCHC: 31.3 g/dL — AB (ref 32.0–36.0)
MCV: 85 fL (ref 80–100)
MONO ABS: 0.9 x10 3/mm (ref 0.2–0.9)
Monocyte %: 12.4 %
Neutrophil #: 5.1 10*3/uL (ref 1.4–6.5)
Neutrophil %: 69.5 %
PLATELETS: 333 10*3/uL (ref 150–440)
RBC: 3.53 10*6/uL — AB (ref 3.80–5.20)
RDW: 21.9 % — ABNORMAL HIGH (ref 11.5–14.5)
WBC: 7.3 10*3/uL (ref 3.6–11.0)

## 2014-09-23 LAB — BASIC METABOLIC PANEL
ANION GAP: 7 (ref 7–16)
BUN: 28 mg/dL — ABNORMAL HIGH (ref 7–18)
CO2: 29 mmol/L (ref 21–32)
Calcium, Total: 8.8 mg/dL (ref 8.5–10.1)
Chloride: 105 mmol/L (ref 98–107)
Creatinine: 1.42 mg/dL — ABNORMAL HIGH (ref 0.60–1.30)
EGFR (African American): 46 — ABNORMAL LOW
GFR CALC NON AF AMER: 38 — AB
GLUCOSE: 81 mg/dL (ref 65–99)
Osmolality: 286 (ref 275–301)
Potassium: 3.9 mmol/L (ref 3.5–5.1)
Sodium: 141 mmol/L (ref 136–145)

## 2014-09-25 ENCOUNTER — Encounter: Payer: Self-pay | Admitting: Internal Medicine

## 2014-10-01 LAB — URINALYSIS, COMPLETE
BILIRUBIN, UR: NEGATIVE
Bacteria: NONE SEEN
Blood: NEGATIVE
GLUCOSE, UR: NEGATIVE mg/dL (ref 0–75)
Hyaline Cast: 2
Ketone: NEGATIVE
LEUKOCYTE ESTERASE: NEGATIVE
Nitrite: NEGATIVE
Ph: 6 (ref 4.5–8.0)
Specific Gravity: 1.009 (ref 1.003–1.030)
WBC UR: 5 /HPF (ref 0–5)

## 2014-10-03 LAB — URINE CULTURE

## 2014-10-23 ENCOUNTER — Encounter: Payer: Self-pay | Admitting: Internal Medicine

## 2014-11-15 ENCOUNTER — Inpatient Hospital Stay: Payer: Self-pay | Admitting: Internal Medicine

## 2014-11-15 LAB — CBC WITH DIFFERENTIAL/PLATELET
Basophil #: 0.1 10*3/uL (ref 0.0–0.1)
Basophil %: 0.3 %
EOS ABS: 0 10*3/uL (ref 0.0–0.7)
Eosinophil %: 0.1 %
HCT: 34.8 % — AB (ref 35.0–47.0)
HGB: 11.2 g/dL — AB (ref 12.0–16.0)
Lymphocyte #: 0.8 10*3/uL — ABNORMAL LOW (ref 1.0–3.6)
Lymphocyte %: 4 %
MCH: 28.7 pg (ref 26.0–34.0)
MCHC: 32.2 g/dL (ref 32.0–36.0)
MCV: 89 fL (ref 80–100)
Monocyte #: 0.8 x10 3/mm (ref 0.2–0.9)
Monocyte %: 4 %
NEUTROS ABS: 18.1 10*3/uL — AB (ref 1.4–6.5)
Neutrophil %: 91.6 %
Platelet: 602 10*3/uL — ABNORMAL HIGH (ref 150–440)
RBC: 3.9 10*6/uL (ref 3.80–5.20)
RDW: 19.9 % — ABNORMAL HIGH (ref 11.5–14.5)
WBC: 19.8 10*3/uL — ABNORMAL HIGH (ref 3.6–11.0)

## 2014-11-15 LAB — CLOSTRIDIUM DIFFICILE(ARMC)

## 2014-11-15 LAB — COMPREHENSIVE METABOLIC PANEL
ALBUMIN: 2.2 g/dL — AB (ref 3.4–5.0)
ANION GAP: 13 (ref 7–16)
AST: 40 U/L — AB (ref 15–37)
Alkaline Phosphatase: 180 U/L — ABNORMAL HIGH
BILIRUBIN TOTAL: 0.6 mg/dL (ref 0.2–1.0)
BUN: 26 mg/dL — AB (ref 7–18)
CHLORIDE: 97 mmol/L — AB (ref 98–107)
Calcium, Total: 9.1 mg/dL (ref 8.5–10.1)
Co2: 25 mmol/L (ref 21–32)
Creatinine: 1.41 mg/dL — ABNORMAL HIGH (ref 0.60–1.30)
EGFR (African American): 46 — ABNORMAL LOW
EGFR (Non-African Amer.): 38 — ABNORMAL LOW
Glucose: 166 mg/dL — ABNORMAL HIGH (ref 65–99)
Osmolality: 279 (ref 275–301)
POTASSIUM: 3.7 mmol/L (ref 3.5–5.1)
SGPT (ALT): 34 U/L
Sodium: 135 mmol/L — ABNORMAL LOW (ref 136–145)
TOTAL PROTEIN: 5.2 g/dL — AB (ref 6.4–8.2)

## 2014-11-15 LAB — TROPONIN I
Troponin-I: <0.02 ng/mL
Troponin-I: <0.02 ng/mL
Troponin-I: 0.03 ng/mL

## 2014-11-15 LAB — URINALYSIS, COMPLETE
BACTERIA: NONE SEEN
BILIRUBIN, UR: NEGATIVE
BLOOD: NEGATIVE
Glucose,UR: NEGATIVE mg/dL (ref 0–75)
KETONE: NEGATIVE
Leukocyte Esterase: NEGATIVE
Nitrite: NEGATIVE
PH: 5 (ref 4.5–8.0)
Protein: NEGATIVE
RBC,UR: NONE SEEN /HPF (ref 0–5)
Specific Gravity: 1.008 (ref 1.003–1.030)
WBC UR: 1 /HPF (ref 0–5)

## 2014-11-15 LAB — LIPASE, BLOOD: Lipase: 164 U/L (ref 73–393)

## 2014-11-16 LAB — CBC WITH DIFFERENTIAL/PLATELET
BASOS ABS: 0 10*3/uL (ref 0.0–0.1)
Basophil %: 0.1 %
EOS PCT: 0 %
Eosinophil #: 0 10*3/uL (ref 0.0–0.7)
HCT: 28.3 % — AB (ref 35.0–47.0)
HGB: 9 g/dL — ABNORMAL LOW (ref 12.0–16.0)
LYMPHS ABS: 0.5 10*3/uL — AB (ref 1.0–3.6)
Lymphocyte %: 2.4 %
MCH: 29 pg (ref 26.0–34.0)
MCHC: 31.8 g/dL — AB (ref 32.0–36.0)
MCV: 91 fL (ref 80–100)
MONO ABS: 1 x10 3/mm — AB (ref 0.2–0.9)
Monocyte %: 5 %
Neutrophil #: 19.4 10*3/uL — ABNORMAL HIGH (ref 1.4–6.5)
Neutrophil %: 92.5 %
Platelet: 433 10*3/uL (ref 150–440)
RBC: 3.1 10*6/uL — ABNORMAL LOW (ref 3.80–5.20)
RDW: 20.1 % — AB (ref 11.5–14.5)
WBC: 20.9 10*3/uL — ABNORMAL HIGH (ref 3.6–11.0)

## 2014-11-16 LAB — COMPREHENSIVE METABOLIC PANEL
ALBUMIN: 1.6 g/dL — AB (ref 3.4–5.0)
Alkaline Phosphatase: 131 U/L — ABNORMAL HIGH
Anion Gap: 11 (ref 7–16)
BUN: 26 mg/dL — ABNORMAL HIGH (ref 7–18)
Bilirubin,Total: 0.5 mg/dL (ref 0.2–1.0)
CREATININE: 1.43 mg/dL — AB (ref 0.60–1.30)
Calcium, Total: 7.4 mg/dL — ABNORMAL LOW (ref 8.5–10.1)
Chloride: 104 mmol/L (ref 98–107)
Co2: 23 mmol/L (ref 21–32)
EGFR (African American): 45 — ABNORMAL LOW
EGFR (Non-African Amer.): 37 — ABNORMAL LOW
GLUCOSE: 94 mg/dL (ref 65–99)
OSMOLALITY: 280 (ref 275–301)
Potassium: 3.2 mmol/L — ABNORMAL LOW (ref 3.5–5.1)
SGOT(AST): 26 U/L (ref 15–37)
SGPT (ALT): 22 U/L
SODIUM: 138 mmol/L (ref 136–145)
Total Protein: 4.3 g/dL — ABNORMAL LOW (ref 6.4–8.2)

## 2014-11-16 LAB — MAGNESIUM: MAGNESIUM: 0.8 mg/dL — AB

## 2014-11-16 LAB — CK-MB: CK-MB: 1.5 ng/mL (ref 0.5–3.6)

## 2014-11-16 LAB — TSH: Thyroid Stimulating Horm: 4.95 u[IU]/mL — ABNORMAL HIGH

## 2014-11-16 LAB — LIPASE, BLOOD: LIPASE: 108 U/L (ref 73–393)

## 2014-11-17 LAB — CBC WITH DIFFERENTIAL/PLATELET
Basophil #: 0 10*3/uL (ref 0.0–0.1)
Basophil %: 0.1 %
EOS PCT: 0 %
Eosinophil #: 0 10*3/uL (ref 0.0–0.7)
HCT: 25.9 % — ABNORMAL LOW (ref 35.0–47.0)
HGB: 8.4 g/dL — AB (ref 12.0–16.0)
LYMPHS ABS: 0.5 10*3/uL — AB (ref 1.0–3.6)
Lymphocyte %: 3.8 %
MCH: 29.3 pg (ref 26.0–34.0)
MCHC: 32.4 g/dL (ref 32.0–36.0)
MCV: 91 fL (ref 80–100)
MONO ABS: 0.3 x10 3/mm (ref 0.2–0.9)
Monocyte %: 2 %
Neutrophil #: 12.5 10*3/uL — ABNORMAL HIGH (ref 1.4–6.5)
Neutrophil %: 94.1 %
Platelet: 416 10*3/uL (ref 150–440)
RBC: 2.86 10*6/uL — ABNORMAL LOW (ref 3.80–5.20)
RDW: 19.9 % — AB (ref 11.5–14.5)
WBC: 13.3 10*3/uL — ABNORMAL HIGH (ref 3.6–11.0)

## 2014-11-17 LAB — COMPREHENSIVE METABOLIC PANEL
AST: 23 U/L (ref 15–37)
Albumin: 1.7 g/dL — ABNORMAL LOW (ref 3.4–5.0)
Alkaline Phosphatase: 128 U/L — ABNORMAL HIGH (ref 46–116)
Anion Gap: 10 (ref 7–16)
BUN: 26 mg/dL — AB (ref 7–18)
Bilirubin,Total: 0.2 mg/dL (ref 0.2–1.0)
CALCIUM: 7.1 mg/dL — AB (ref 8.5–10.1)
Chloride: 107 mmol/L (ref 98–107)
Co2: 21 mmol/L (ref 21–32)
Creatinine: 1.65 mg/dL — ABNORMAL HIGH (ref 0.60–1.30)
EGFR (African American): 38 — ABNORMAL LOW
EGFR (Non-African Amer.): 32 — ABNORMAL LOW
Glucose: 83 mg/dL (ref 65–99)
Osmolality: 280 (ref 275–301)
POTASSIUM: 3.3 mmol/L — AB (ref 3.5–5.1)
SGPT (ALT): 23 U/L (ref 14–63)
Sodium: 138 mmol/L (ref 136–145)
Total Protein: 4.2 g/dL — ABNORMAL LOW (ref 6.4–8.2)

## 2014-11-17 LAB — MAGNESIUM: MAGNESIUM: 1.1 mg/dL — AB

## 2014-11-17 LAB — CANCER ANTIGEN 19-9: CA 19-9: 46 U/mL — ABNORMAL HIGH (ref 0–35)

## 2014-11-18 LAB — CBC WITH DIFFERENTIAL/PLATELET
Basophil #: 0 10*3/uL (ref 0.0–0.1)
Basophil %: 0.1 %
EOS ABS: 0 10*3/uL (ref 0.0–0.7)
EOS PCT: 0.1 %
HCT: 26.5 % — ABNORMAL LOW (ref 35.0–47.0)
HGB: 8.4 g/dL — ABNORMAL LOW (ref 12.0–16.0)
LYMPHS ABS: 0.7 10*3/uL — AB (ref 1.0–3.6)
Lymphocyte %: 6.1 %
MCH: 29 pg (ref 26.0–34.0)
MCHC: 31.9 g/dL — ABNORMAL LOW (ref 32.0–36.0)
MCV: 91 fL (ref 80–100)
Monocyte #: 0.6 x10 3/mm (ref 0.2–0.9)
Monocyte %: 5.5 %
Neutrophil #: 10.1 10*3/uL — ABNORMAL HIGH (ref 1.4–6.5)
Neutrophil %: 88.2 %
Platelet: 429 10*3/uL (ref 150–440)
RBC: 2.91 10*6/uL — AB (ref 3.80–5.20)
RDW: 20 % — ABNORMAL HIGH (ref 11.5–14.5)
WBC: 11.5 10*3/uL — ABNORMAL HIGH (ref 3.6–11.0)

## 2014-11-18 LAB — BASIC METABOLIC PANEL
Anion Gap: 10 (ref 7–16)
BUN: 26 mg/dL — ABNORMAL HIGH (ref 7–18)
CALCIUM: 7.3 mg/dL — AB (ref 8.5–10.1)
CO2: 22 mmol/L (ref 21–32)
Chloride: 107 mmol/L (ref 98–107)
Creatinine: 1.51 mg/dL — ABNORMAL HIGH (ref 0.60–1.30)
EGFR (Non-African Amer.): 35 — ABNORMAL LOW
GFR CALC AF AMER: 42 — AB
Glucose: 66 mg/dL (ref 65–99)
Osmolality: 280 (ref 275–301)
Potassium: 3.5 mmol/L (ref 3.5–5.1)
Sodium: 139 mmol/L (ref 136–145)

## 2014-11-18 LAB — MAGNESIUM: MAGNESIUM: 1.6 mg/dL — AB

## 2014-11-19 LAB — LIPASE, BLOOD: Lipase: 406 U/L — ABNORMAL HIGH (ref 73–393)

## 2014-11-20 LAB — CBC WITH DIFFERENTIAL/PLATELET
Basophil #: 0 10*3/uL (ref 0.0–0.1)
Basophil %: 0.1 %
EOS ABS: 0.1 10*3/uL (ref 0.0–0.7)
EOS PCT: 0.8 %
HCT: 28.3 % — ABNORMAL LOW (ref 35.0–47.0)
HGB: 9.2 g/dL — ABNORMAL LOW (ref 12.0–16.0)
LYMPHS PCT: 6.3 %
Lymphocyte #: 0.7 10*3/uL — ABNORMAL LOW (ref 1.0–3.6)
MCH: 29 pg (ref 26.0–34.0)
MCHC: 32.4 g/dL (ref 32.0–36.0)
MCV: 90 fL (ref 80–100)
Monocyte #: 0.7 x10 3/mm (ref 0.2–0.9)
Monocyte %: 6.3 %
NEUTROS PCT: 86.5 %
Neutrophil #: 9.3 10*3/uL — ABNORMAL HIGH (ref 1.4–6.5)
Platelet: 482 10*3/uL — ABNORMAL HIGH (ref 150–440)
RBC: 3.16 10*6/uL — AB (ref 3.80–5.20)
RDW: 20.1 % — ABNORMAL HIGH (ref 11.5–14.5)
WBC: 10.7 10*3/uL (ref 3.6–11.0)

## 2014-11-20 LAB — BASIC METABOLIC PANEL
Anion Gap: 8 (ref 7–16)
BUN: 17 mg/dL (ref 7–18)
CO2: 25 mmol/L (ref 21–32)
CREATININE: 1.23 mg/dL (ref 0.60–1.30)
Calcium, Total: 7.7 mg/dL — ABNORMAL LOW (ref 8.5–10.1)
Chloride: 107 mmol/L (ref 98–107)
EGFR (African American): 54 — ABNORMAL LOW
EGFR (Non-African Amer.): 44 — ABNORMAL LOW
Glucose: 106 mg/dL — ABNORMAL HIGH (ref 65–99)
OSMOLALITY: 281 (ref 275–301)
Potassium: 3.7 mmol/L (ref 3.5–5.1)
Sodium: 140 mmol/L (ref 136–145)

## 2014-11-20 LAB — HEPATIC FUNCTION PANEL A (ARMC)
ALBUMIN: 1.9 g/dL — AB (ref 3.4–5.0)
ALT: 28 U/L (ref 14–63)
Alkaline Phosphatase: 184 U/L — ABNORMAL HIGH (ref 46–116)
Bilirubin,Total: 0.2 mg/dL (ref 0.2–1.0)
SGOT(AST): 28 U/L (ref 15–37)
TOTAL PROTEIN: 4.9 g/dL — AB (ref 6.4–8.2)

## 2014-11-20 LAB — CULTURE, BLOOD (SINGLE)

## 2014-11-20 LAB — MAGNESIUM: Magnesium: 1.1 mg/dL — ABNORMAL LOW

## 2014-11-20 LAB — LIPASE, BLOOD: LIPASE: 589 U/L — AB (ref 73–393)

## 2014-11-20 LAB — STOOL CULTURE

## 2014-11-21 LAB — BASIC METABOLIC PANEL
ANION GAP: 6 — AB (ref 7–16)
BUN: 15 mg/dL (ref 7–18)
CREATININE: 0.92 mg/dL (ref 0.60–1.30)
Calcium, Total: 7.4 mg/dL — ABNORMAL LOW (ref 8.5–10.1)
Chloride: 109 mmol/L — ABNORMAL HIGH (ref 98–107)
Co2: 25 mmol/L (ref 21–32)
GLUCOSE: 95 mg/dL (ref 65–99)
OSMOLALITY: 280 (ref 275–301)
Potassium: 3.5 mmol/L (ref 3.5–5.1)
Sodium: 140 mmol/L (ref 136–145)

## 2014-11-21 LAB — LIPASE, BLOOD: LIPASE: 460 U/L — AB (ref 73–393)

## 2014-11-21 LAB — MAGNESIUM: Magnesium: 2.1 mg/dL

## 2014-11-22 LAB — BASIC METABOLIC PANEL
Anion Gap: 4 — ABNORMAL LOW (ref 7–16)
BUN: 15 mg/dL (ref 7–18)
CO2: 27 mmol/L (ref 21–32)
Calcium, Total: 7.8 mg/dL — ABNORMAL LOW (ref 8.5–10.1)
Chloride: 109 mmol/L — ABNORMAL HIGH (ref 98–107)
Creatinine: 1.01 mg/dL (ref 0.60–1.30)
EGFR (African American): >60
GFR CALC NON AF AMER: 56 — AB
GLUCOSE: 95 mg/dL (ref 65–99)
OSMOLALITY: 280 (ref 275–301)
Potassium: 3.7 mmol/L (ref 3.5–5.1)
Sodium: 140 mmol/L (ref 136–145)

## 2014-11-22 LAB — LIPASE, BLOOD: Lipase: 509 U/L — ABNORMAL HIGH (ref 73–393)

## 2014-11-23 LAB — MAGNESIUM: Magnesium: 2.3 mg/dL

## 2014-11-24 LAB — LIPASE, BLOOD: Lipase: 416 U/L — ABNORMAL HIGH (ref 73–393)

## 2014-11-24 LAB — COMPREHENSIVE METABOLIC PANEL
ALK PHOS: 135 U/L — AB (ref 46–116)
ALT: 28 U/L (ref 14–63)
AST: 25 U/L (ref 15–37)
Albumin: 1.9 g/dL — ABNORMAL LOW (ref 3.4–5.0)
Anion Gap: 7 (ref 7–16)
BUN: 12 mg/dL (ref 7–18)
Bilirubin,Total: 0.1 mg/dL — ABNORMAL LOW (ref 0.2–1.0)
CALCIUM: 8.5 mg/dL (ref 8.5–10.1)
CHLORIDE: 110 mmol/L — AB (ref 98–107)
Co2: 25 mmol/L (ref 21–32)
Creatinine: 0.71 mg/dL (ref 0.60–1.30)
Glucose: 116 mg/dL — ABNORMAL HIGH (ref 65–99)
Osmolality: 284 (ref 275–301)
Potassium: 3.8 mmol/L (ref 3.5–5.1)
Sodium: 142 mmol/L (ref 136–145)
Total Protein: 4.7 g/dL — ABNORMAL LOW (ref 6.4–8.2)

## 2015-02-12 NOTE — H&P (Signed)
PATIENT NAME:  [name], [name] MR#:  [id number] DATE OF BIRTH:  1929/04/01  DATE OF ADMISSION:  07/03/2013  An 81 year old female presents with nausea, inability to swallow, lethargy, confusion and a sodium of 114. She has had progressive problems over the last 7 to 10 days. As noted above, she has not really had any significant p.o. intake for 4 to 5 days. She was able to take her medications but that is it. No dysphagia or odynophasia per se. Bowels have been okay. Blood pressure has been reasonable. No chest pain. So she will be admitted for treatment of her hyponatremia.   PAST MEDICAL HISTORY AND MEDICAL ILLNESSES:  1.  Hypothyroidism.  2.  Arthritis. 3.  Hypertension.  4.  Lumbar disk disease.  5.  Restless leg syndrome.  6.  Mild sleep apnea.   PAST SURGICAL HISTORY:  1.  Carpal tunnel.  2.  Cholecystectomy.  3.  LS laminectomy x 4.  4.  Left total knee replacement. 5.  L3-4 laminectomy, 2006.   ALLERGIES:  CODEINE, MACRODANTIN, PENICILLIN, VALIUM, ZIAC.  MEDICATIONS: 1.  Cymbalta 60 mg daily.  2.  Synthroid 112 mcg daily.  3.  Vitamin [name] 2000 units daily.  4.  Hydralazine 75 mg t.i.[name].  5.  Protonix 40 mg b.i.[name].  6.  Benicar 40 mg daily.  7.  Klor-Con 10 mEq daily.  8.  Metoprolol succinate 25 mg b.i.[name].  9.  Coreg CR 20 mg at bedtime.  10.  Norvasc 10 mg, one-half daily.  11.  Norco 5/325, 1/2 tablet t.i.[name]. p.r.n. pain.   SOCIAL HISTORY:  Widowed. Lives alone. No smoking or alcohol.   FAMILY HISTORY:  Noncontributory.   REVIEW OF SYSTEMS:  As above, otherwise negative.   PHYSICAL EXAMINATION:  GENERAL:  Nausea, feels terrible. VITAL SIGNS:  Blood pressure 167/90, pulse 80 and regular.  HEENT:  Pupils are equal and reactive.  NECK:  No thyromegaly or bruits. No JVD.  LUNGS:  Clear to auscultation and percussion.  HEART:  Regular rhythm. No audible murmur.  ABDOMEN:  Soft, nontender.  NEUROLOGICAL:  Nonfocal, generalized lethargy, diffusely weak, cannot really  hardly stand up.   LABS:  Sodium of 114, TSH 6.1.   ASSESSMENT AND PLAN:  1.  Severe symptomatic hyponatremia. Normal saline, IV thyroid with her elevated TSH. Check cortisol level in the morning, has not been on any diuretics per se recently although she has been on a lot of steroids for her back pain.  2.  Lumbar disk disease:  Reasonable on the Norco. She is off Xanax, off all sedating medications.  3.  We will re-add her salt load very slowly. She has had some diarrhea, perhaps that has led to her hyponatremia.   PROGNOSIS:  Guarded.   ____________________________ Danella PentonMark F. Lanya Bucks, MD mfm:jm [name]: 07/03/2013 17:26:00 ET T: 07/03/2013 18:17:50 ET JOB#: [id number]  cc: Danella PentonMark F. Tykeshia Tourangeau, MD, <Dictator> Danella PentonMARK F Siarah Deleo MD ELECTRONICALLY SIGNED 07/04/2013 7:56

## 2015-02-12 NOTE — Discharge Summary (Signed)
PATIENT NAME:  [name], [name] MR#:  [id number] DATE OF BIRTH:  10/17/1929  DATE OF ADMISSION:  07/03/2013 DATE OF DISCHARGE: 07/07/2013   DISCHARGE DIAGNOSES:  1.  Symptomatic hyponatremia due to adrenal insufficiency.  2.  Osteoarthritis. 3.  Hypertension.  4.  Hypothyroidism.  5.  Rapid atrial fibrillation.  DISCHARGE MEDICATIONS: Align 4 mg daily, Synthroid 112 mcg daily, vitamin D3 2000 units daily, hydralazine 75 mg t.i.[name].,  Protonix 40 mg b.i.[name]., Benicar 40 mg daily, Coreg CR 20 mg at bedtime, Norvasc 10 mg 1/2 tab daily, docusate  1 b.i.[name]., Claritin RediTabs 10 mg daily, Norco phosphorus 325, 1/2 t.i.[name]., hydrocortisone 15 mg daily and metoprolol succinate 50 mg b.i.[name].   REASON FOR ADMISSION: This 81 year old female presents with tachycardia and altered mental status with dysphagia. Please see H and P for history of present illness, past medical history and physical exam.   HOSPITAL COURSE: The patient was admitted with a sodium of 114. She was given normal saline. Her cortisol level came back on the lower side consistent with adrenal insufficiency due to her recent multiple steroids for her back problems. She received Solu-Medrol, which helped dramatically. Dr. Tedd SiasSolum saw her and placed her on hydrocortisone 10 mg daily. Sodium came up to 134 peaking at 130 today on discharge. In case Cymbalta was an issue with her hyponatremia, that was discontinued. She is back to her baseline status. Blood pressure medicines will be re- added as her blood pressure is back up. Her tachycardia was treated with higher doses of metoprolol succinate and she came back to normal sinus rhythm without incident. Overall prognosis is guarded. She will follow up with Dr. Hyacinth MeekerMiller and Dr. Tedd SiasSolum.  ____________________________ Danella PentonMark F. Miller, MD mfm:aw [name]: 07/07/2013 08:00:50 ET T: 07/07/2013 08:19:44 ET JOB#: [id number]  cc: Danella PentonMark F. Miller, MD, <Dictator> MARK Sherlene ShamsF MILLER MD ELECTRONICALLY SIGNED 07/07/2013 14:00

## 2015-02-13 NOTE — Discharge Summary (Signed)
PATIENT NAME:  [name], [name] MR#:  [id number] DATE OF BIRTH:  [birth year]  DATE OF ADMISSION:  08/18/2014 DATE OF DISCHARGE:  08/21/2014  DISCHARGE DIAGNOSES: 1.  Symptomatic hyponatremia.  2.  Abdominal pain secondary to obstipation.  3.  Urinary tract infection.  4.  Hypothyroidism. 5.  Lumbar spinal stenosis with prominent left leg weakness.  6.  Reflux esophagitis.  7.  Hypertension.  8.  History of atrial fibrillation, on Eliquis.   DISCHARGE MEDICATIONS: Synthroid 125 mcg daily, hydrocortisone 15 mg b.i.[name]., Eliquis 5 mg b.i.[name]., Mirapex 1 mg at bedtime, metoprolol tartrate 50 mg q.i.[name]., diltiazem 180 mg daily, potassium chloride 8 mEq daily, pantoprazole 40 mg b.i.[name]., demeclocycline 300 mg q. 12, sucralfate 1 gram q.i.[name]., Norco 5/325 q. 6 p.r.n. pain, Benicar 40 mg daily, hydralazine 75 mg t.i.[name]., MiraLax daily, salt tablet twice daily.   REASON FOR ADMISSION: An 81 year old female who presents with abdominal pain and nausea. Please see H and P for HPI, past medical history and physical examination.  HOSPITAL COURSE: The patient was admitted with sodium of 124, coming up to 133 with normal saline and the addition of salt tablet. She has known adrenal insufficiency, however, treatment of that does not seem to be helping her sodium level. Heart rate was controlled with normal sinus rhythm. She continues to have left leg weakness and has been getting ESIs per [facility]. She has known lumbar spine disease. CT of the abdomen unremarkable other than profound sigmoid fecal mass, which was moved with enemas. Her abdominal pain resolved. Her UTI was treated with IV antibiotics. Overall prognosis is guarded. Her blood pressure is up and down, but overall stable. ____________________________ Danella PentonMark F. Quy Lotts, MD mfm:sb [name]: 08/21/2014 07:37:45 ET T: 08/21/2014 09:31:57 ET JOB#: [id number]  cc: Danella PentonMark F. Laura Amara, MD, <Dictator> [name] Laura ShamsF [name] MD ELECTRONICALLY SIGNED 08/22/2014 9:50

## 2015-02-13 NOTE — Discharge Summary (Signed)
PATIENT NAME:  [name], [name] MR#:  [id number] DATE OF BIRTH:  09/02/1929  DATE OF ADMISSION:  05/11/2014 DATE OF DISCHARGE:  05/18/2014  DISCHARGE DIAGNOSES:  1.  Symptomatic hyponatremia due to adrenal insufficiency.  2.  Hypertension.  3.  Progressive lumbar disk disease.  4.  Urinary tract infection, Escherichia coli.  5.  Hypertension.  6.  Hypothyroidism.  7.  Constipation.   DISCHARGE MEDICATIONS: Vitamin D3, 2000 units daily, Benicar 40 mg daily, Coreg CR 20 mg at bedtime, venlafaxine 37.5 mg daily, amlodipine 5 mg b.i.[name]., pantoprazole 40 mg b.i.[name]., hydrocortisone 20 mg b.i.[name]., Norco 5/325 one q.6 hours, Mirapex 0.125 mg b.i.[name]., Carafate 1 gram t.i.[name]., Synthroid 125 mcg daily, hydralazine 50 mg t.i.[name]., cefuroxime 250 mg b.i.[name]. x 5 days, MiraLax 17 grams daily p.r.n. constipation, Colace 100 mg b.i.[name]., Ensure daily.  The patient is on a 1500 mL daily fluid restriction.   REASON FOR ADMISSION: An 81 year old female presents with symptomatic hyponatremia and urinary tract infection. Please see H and P for history of present illness, past medical history, and physical exam.   HOSPITAL COURSE: The patient was admitted. She was given normal saline.  Dr. Tedd SiasSolum with endocrine saw her and increased her steroids to 20 mg b.i.[name]. Her sodium with the fluid restriction came up to the 131 to 133 range and her nausea improved. Over time her Cortef can decreased slowly down to 10 mg b.i.[name]., needs metabolic followup. Urinary tract infection was treated with IV Rocephin and then switched to p.o. Ceftin and she will need 5 more days of treatment. For her back, she will need an ESI in early August, August 6 per [facility]. Overall prognosis is guarded due to her weakness, will need OT, PT and consideration for long-term placement will need to be made on consultation with the family.   ____________________________ Danella PentonMark F. Cintia Gleed, MD mfm:lt [name]: 05/18/2014 08:26:54 ET T: 05/18/2014  08:39:12 ET JOB#: [id number]  cc: Danella PentonMark F. Azlynn Mitnick, MD, <Dictator> Criselda Starke Sherlene ShamsF Dannie Woolen MD ELECTRONICALLY SIGNED 05/19/2014 8:30

## 2015-02-13 NOTE — Discharge Summary (Signed)
PATIENT NAME:  [name], [name] MR#:  [id number] DATE OF BIRTH:  04-14-1929  DATE OF ADMISSION:  06/24/2014 DATE OF DISCHARGE:  07/02/2014  DISCHARGE DIAGNOSES:  1. Rapid atrial fibrillation with rapid ventricular response and hypotension.  2. Symptomatic hyponatremia, syndrome of inappropriate antidiuretic hormone.  3. Hypertension.  4. Urinary tract infection, gram-negative rod.  5. Lumbar spinal stenosis.  6. Irritable bowel syndrome. 7. Acute gastritis.  8. Restless leg syndrome. 9. Adrenal cortical insufficiency.    DISCHARGE MEDICATIONS:  1. Synthroid 125 mcg daily. 2. Zofran 4 mg q.8 h. p.r.n.  3. MiraLax 17 grams daily p.r.n. 4. Pantoprazole 40 mg b.i.d.  5. Hydrocortisone 15 mg b.i.d. 6. Benicar 20 mg daily. 7. Vitamin D3 2000 units daily. Amiodarone 200 mg daily. 8. Eliquis 5 mg b.i.d.  9. Mirapex 1 mg at bedtime. 10. Metoprolol tartrate 50 mg q.6 h.  11. Diltiazem ER 180 mg daily. 12. Cefuroxime 250 mg b.i.d. x 5 days.  13. Senna 1 b.i.d. p.r.n.  14. Potassium chloride 8 mEq daily x 1 week.  15. Sucralfate 1 gram q.a.c. 16. Demeclocycline 300 mg b.i.d.   REASON FOR ADMISSION: An 81 year old female presents with rapid atrial fibrillation and hyponatremia, sodium of 118. Please see history and physical for HPI, past medical history and physical examination.   HOSPITAL COURSE: The patient was admitted to the intensive care unit, received IV diltiazem. Ultimately IV amiodarone with conversion to normal sinus rhythm, normal ejection fraction. She will remain on Eliquis for 6 weeks, as long as she stays in normal sinus rhythm. She had an episode of constipation and bloating, which was treated. Urinary tract infection was treated with IV Rocephin as she was intolerant to Cipro from nausea. Her sodium came up from 118 and was stable at 134 on discharge. In the past, her sodium has been treated as adrenal cortical insufficiency with hydrocortisone; however, this is insufficient,  and over time, her hydrocortisone dose can be dropped to 10 mg b.i.d. Dr. Wynelle LinkKolluru of nephrology saw her, thought it was SIADH, stopped her antidepressants, Cymbalta, and ultimately started on demeclocycline. He will follow up on that. She had a urinary tract infection, treated with IV Rocephin, and there is a question of minimal infiltrate right lower lung, and she will be on p.o. Ceftin for 5 days. Most of her cough, congestion and wheezing is coming from her upper airway, another good reason to lower her cortisone dose. Overall prognosis is guarded. She is very weak on her feet and will be getting OT and PT.    ____________________________ Danella PentonMark F. Miller, MD mfm:JT D: 07/02/2014 07:55:23 ET T: 07/02/2014 08:27:49 ET JOB#: [id number]  cc: Danella PentonMark F. Miller, MD, <Dictator>  MARK Sherlene ShamsF MILLER MD ELECTRONICALLY SIGNED 07/03/2014 8:13

## 2015-02-13 NOTE — Op Note (Signed)
PATIENT NAME:  [name], [name] MR#:  [id number] DATE OF BIRTH:  04/18/1929  DATE OF PROCEDURE:  09/21/2014  PREOPERATIVE DIAGNOSIS: T9, T11, and L1 compression fractures.   POSTOPERATIVE DIAGNOSIS: T9, T11, and L1 compression fractures.   PROCEDURE: Kyphoplasty T9, T11, and L1.   ANESTHESIA: MAC.   SURGEON: Kennedy BuckerMichael Joslyne Marshburn, MD.   DESCRIPTION OF PROCEDURE: The patient was brought to the operating room and after adequate sedation was given with MAC the patient was placed in a prone position with appropriate padding. C-arm was brought in and good visualization of the affected levels could be obtained in both AP and lateral projections prior to the start of the case. Timeout procedure was carried out and the skin was prepped with alcohol with 5 mL of local 1% Xylocaine being infiltrated subcutaneously at each of the affected levels. Next the back was prepped and draped in the usual sterile fashion, repeat timeout procedure and patient identification carried out. A spinal needle was then used to get local anesthetic, a 50/50 mix of 0.5% Sensorcaine with epinephrine and 1% Xylocaine, approximately 15 mL at each level down to the pedicle. T9 was approached first on the right side with a small stab incision with fluoroscopy helping guide the trocar. The vertebral body was entered lateral to the medial wall of the pedicle, drilling carried out, and a balloon inserted with inflation of the balloon approximately 1 mL. Going down to L1 identical procedure carried out with inflation of about 2 mL. The left side on T11 was then approached and with an extra-pedicular approach the same procedure carried out. At L1 and T11 a good biopsy was obtained and was sent for specimen. After drilling and balloon inflation at each level the cement was mixed.  When it was the appropriate consistency the levels were filled starting at the top, at T9, going down to T11, and then L1, with adequate fill with interdigitation visible at all  levels. After appropriate amount of cement and fixation placed crossing the midline at each level trocars were removed and the wound closed with Dermabond. After this dried Band-Aids were applied.   ESTIMATED BLOOD LOSS: 25 mL.   COMPLICATIONS: None.   SPECIMEN: T11 and L1 vertebral bodies.    IMPLANT: Bone cement.   COMPLICATIONS: None.   CONDITION: To recovery room stable.     ____________________________ Leitha SchullerMichael J. Khiya Friese, MD mjm:bu [name]: 09/21/2014 18:25:00 ET T: 09/21/2014 19:57:00 ET JOB#: [id number]  cc: Leitha SchullerMichael J. Luian Schumpert, MD, <Dictator> Leitha SchullerMICHAEL J Elston Aldape MD ELECTRONICALLY SIGNED 09/21/2014 21:43

## 2015-02-13 NOTE — Discharge Summary (Signed)
PATIENT NAME:  Laura [name], Laura [name] MR#:  [id number] DATE OF BIRTH:  02-03-1929  DATE OF ADMISSION:  04/02/2014 DATE OF DISCHARGE:  04/08/2014  DISCHARGE DIAGNOSES: 1.  DivWyvonnia Loraerticulitis.  2.  Hyponatremia.  3.  Adrenal insufficiency.  4.  Hypertension.  5.  Lumbar disk disease.   HISTORY OF PRESENT ILLNESS: Please see initial history and physical for details. Briefly, the patient is [age] and has a history of known prior diverticulitis with onset of  abdominal pain. CT was done but it was negative. She responded to IV meropenem. She was switched to oral levo and Flagyl and will finish her course of this. C. diff testing is negative. For her pain, she was initially on morphine sulfate IV but transitioned to oral Norco and tolerated it well.   For her hyponatremia. She was initially put on stress dose steroids for her adrenal insufficiency, but then tapered to oral hydrocortisone 10 mg twice a day. Her outpatient dose was 7.5 b.i.[name]. and she will be transitioned to this. Her sodium on the day of discharge was 133.   Hypertension. Initially she was hypotensive, so her meds were held but she became hypertensive and her outpatient   meds were resumed.   DISCHARGE MEDICATIONS: [facility] see [facility].   DISCHARGE DIET: Low sodium, regular consistency.   DISCHARGE FOLLOWUP:  The patient will follow up with Dr. Hyacinth MeekerMiller in 1 to 2 weeks.   DISPOSITION: Discharge will be to home with readmission to [facility] skilled nursing facility tomorrow. She was seen by physical therapy, who recommended home health with 24-hour assist which she is unable to provide 24 hour coverage, so she will go to skilled nursing facility to improve her strength.   TIME SPENT: This discharge took 40 minutes.   ____________________________ Stann Mainlandavid P. Sampson GoonFitzgerald, MD dpf:dp [name]: 04/08/2014 14:54:10 ET T: 04/08/2014 15:06:54 ET JOB#: [id number]  cc: Stann Mainlandavid P. Sampson GoonFitzgerald, MD, <Dictator> Ishi Danser Sampson GoonFITZGERALD  MD ELECTRONICALLY SIGNED 04/16/2014 13:24

## 2015-02-13 NOTE — Discharge Summary (Signed)
PATIENT NAME:  [name], [name] MR#:  [id number] DATE OF BIRTH:  [birth year]  DATE OF ADMISSION:  09/14/2014 DATE OF DISCHARGE:  09/24/2014  DISCHARGE DIAGNOSES: 1.  Severe compression fractures of the thoracic and lumbar spine, post kyphoplasty.  2.  Malnutrition with chronic nausea.  3.  Chronic abdominal pain, chronic obstipation.  4.  Volatile hypertension.  5.  Adrenal insufficiency.  6.  Lumbar spinal stenosis with lower extremity weakness.  7.  Hypothyroid.  8.  Chronic atrial fibrillation, on Eliquis. 9.  Restless leg syndrome.  10.  Hyponatremia.  DISCHARGE MEDICATIONS: Potassium chloride 10 mEq daily, demeclocycline 300 mg b.i.[name]., Benicar 40 mg daily, tramadol 50 mg q. 4 p.r.n. pain, Eliquis 5 mg b.i.[name]., Synthroid 0.125 mcg daily, Protonix 40 mg b.i.[name]., Mirapex 1 mg at bedtime, metoprolol tartrate 50 mg 2 tabs b.i.[name]., hydrocortisone 10 mg b.i.[name]., Norco 5/325 one-half tab q. 6 p.r.n. pain, hydralazine 100 mg t.i.[name]., Tylenol 650 mg t.i.[name]., citalopram 10 mg daily, docusate 5 mg 2 tabs at bedtime, MiraLax 17 grams b.i.[name]., vitamin [name] 250,000 units weekly, Carafate 1 gram q.i.[name].   REASON FOR ADMISSION: This [age] year old female presents with severe abdominal and back pain. Please see H and P for HPI, past medical history and physical exam.  HOSPITAL COURSE: The patient was admitted, initially treated for diverticulitis but CTs were negative, really no sign for acute abdominal process. It was thought that her abdominal pain was really referred from her back, although much of her abdominal pain does come from her obstipation. Her abdominal pain seems reasonably controlled if her bowels consistently move and she may need enemas at times. Because of the compression fracture, she underwent kyphoplasty at three levels, T9, T11, and L1 with success and her pain did improve moderately. Her nausea continued to be a major issue and Carafate was added and other medications discontinued. Sodium was stable, in  the high 130s to low 140s. Anxiety is a moderate issue and citalopram added, cannot tolerate Cymbalta. Physical therapy is needed because of lower extremity weakness, and she is able to ambulate barely. Working on her nutrition, controlling her bowels and minimizing pain meds will be the target at skilled nursing. Too much narcotic leads to slurred speech and altered mental status. Overall prognosis is poor.   ____________________________ Laura PentonMark F. Miller, MD mfm:sb [name]: 09/24/2014 08:21:49 ET T: 09/24/2014 10:11:26 ET JOB#: [id number]  cc: Laura PentonMark F. Miller, MD, <Dictator> Laura PentonMARK F MILLER MD ELECTRONICALLY SIGNED 09/25/2014 8:15

## 2015-02-13 NOTE — Consult Note (Signed)
  A. Wendall MolaMELISSA Kysen Wetherington MD ELECTRONICALLY SIGNED 07/01/2014 17:41

## 2015-02-13 NOTE — Consult Note (Signed)
PATIENT NAME:  [name], [name] MR#:  [id number] DATE OF BIRTH:  01/24/1929  DATE OF CONSULTATION:  06/27/2014  CONSULTING PHYSICIAN:  Midge Miniumarren Sascha Palma, MD  CONSULTING SERVICE: Gastroenterology.  REASON FOR CONSULTATION: Nausea.   HISTORY OF PRESENT ILLNESS: This patient is an 81 year old woman who came in with a report of not feeling well. The patient has a history of hyponatremia due to SIADH. The patient had been taken off medication as she was reported to have no cause of her hyponatremia, but the daughter states it was given to her at the care facility by accident. The patient has had multiple episodes of nausea and vomiting over the years. She has undergone multiple procedures by Dr. Bluford Kaufmannh, Dr. Mechele CollinElliott, and Dr. Marva PandaSkulskie for this same nausea and vomiting without any cause of her symptoms being found. The patient's daughter states that she is sure that the medications she is taking are contributing to her nausea and vomiting and states that when she got Norco that is what set it off this time. The patient also had a reaction to Phenergan for her nausea and has been somnolent and unable to give much of a history since receiving that. The patient also has atrial fibrillation with rapid ventricular rate, as high as the 160s while I was in the room today.   PAST MEDICAL HISTORY: Adrenal insufficiency, hyponatremia, osteoarthritis, hypertension.   PAST SURGICAL HISTORY: Cholecystectomy, carpal tunnel syndrome.   ALLERGIES: CODEINE, MACRODANTIN,  PENICILLIN, AS WELL AS VALIUM.   MEDICATIONS: Acetaminophen/hydrocodone, amlodipine, Coreg, Benicar, Gas-X, hydralazine, hydrocortisone, levothyroxine, Zofran, pantoprazole, polyethylene glycol, Senexon, Tylenol, vitamin [name].   SOCIAL HISTORY: No tobacco, alcohol, or drugs.   FAMILY HISTORY: Noncontributory.   REVIEW OF SYSTEMS: On admission, the patient reported diffuse abdominal pain but denies any pain at the present time. The rest of the 10-point review of  systems was negative except for what was stated above.   PHYSICAL EXAMINATION:  GENERAL: The patient is lethargic after getting Phenergan, wakes up for a couple of seconds and then goes back to sleep.  VITAL SIGNS: Temperature 97.7, pulse 121, respirations 24, blood pressure 74/63, pulse oximetry 95% on 2 liters.  HEENT: The patient is pale. Normocephalic, atraumatic. Extraocular motor intact. No scleral icterus. Mucous membranes moist.  LUNGS: Clear to auscultation bilaterally.  HEART: Irregular rate and rhythm with tachycardia.  ABDOMEN: Soft, nontender, nondistended, without hepatosplenomegaly.  EXTREMITIES: Without cyanosis, clubbing, or edema.  NEUROLOGICAL: Grossly intact, although hard to assess with the patient being lethargic.  SKIN: Without any rashes or lesions.   ANCILLARY SERVICES: The patient's sodium is 126. Hemoglobin 9.8, hematocrit 30.5.   ASSESSMENT AND PLAN: This patient is an 81 year old woman who has multiple factors that may contribute to her nausea, including her hyponatremia, her use of Norco, which the daughter states makes her nauseated all the time. I discussed the patient's history with the patient's daughter, who agrees that further endoscopies after having multiple workups in the past being negative would not be fruitful at this time. I would correct the patient's other metabolic disorders and try to avoid the medications that the patient and her daughter know to make the patient nauseated. No further gastrointestinal intervention at this time.   Thank you very much for involving me in the care of this patient. If you have any questions, please do not hesitate to call.   ____________________________ Midge Miniumarren Byrdie Miyazaki, MD dw:ts [name]: 06/27/2014 17:48:03 ET T: 06/27/2014 21:24:13 ET JOB#: [id number]  cc: Midge Miniumarren Rasheena Talmadge, MD, <Dictator> [id number]  MD ELECTRONICALLY SIGNED 06/30/2014 17:57

## 2015-02-13 NOTE — Consult Note (Signed)
Details:   - GI Note:  She appears comf but does report ongoing left sided abdominal and back pain.  No f/c.  no n/v  Exam vss a and o x 4, appears comf abd: very soft, nabs, nt,nt  A/P:   In terms of the abd pain, the CT findings of localized thickening is likely artifactual / spasm and very unlikely to be causing the abd pain. She likely has neuropathic abd and back pain from the multiple severe compression fractures.   Electronic Signatures: Dow Adolphein, Matthew (MD)  (Signed [phone]-Nov-15 16:39)  Authored: Details   Last Updated: 28-Nov-15 16:39 by Dow Adolphein, Matthew (MD)

## 2015-02-13 NOTE — H&P (Signed)
PATIENT NAME:  [name], [name] MR#:  [id number] DATE OF BIRTH:  1929/01/13  DATE OF ADMISSION:  06/24/2014  PRIMARY CARE PHYSICIAN: Danella PentonMark F. Miller, MD  HISTORY OF PRESENT ILLNESS: The patient is an [age] year old Caucasian female with a past medical history significant for history of chronic hyponatremia due to SIADH as well as adrenal insufficiency, who presents to the hospital with complaints of not feeling well. Apparently, the patient got her breakfast and suddenly became very nauseated and felt presyncopal and syncopized for a few minutes. Post- syncope, she was hot and shaky, and was brought to the Emergency Room for further evaluation. In the Emergency Room, her sodium level was noted to be low at 119, but her heart rate also was noted to be high at 140s to 160s intermittently, and she was in atrial fibrillation, rapid ventricular response. She had mild discomfort in her chest, which she described as tightness. Her troponin was also noted to be elevated at about 1.  Hospitalist services were contacted for admission. The patient received 2 doses of 5 mg of Cardizem IV with minimal improvement of her heart rate.   PAST MEDICAL HISTORY: Significant for: History of hepatitis, adrenal insufficiency with hyponatremia, lumbar disk disease, osteoarthritis, hypertension.   PAST SURGICAL HISTORY: cholecystectomy, carpal tunnel surgery, LS laminectomy x 4, left total knee replacement.   ALLERGIES: CODEINE, MACRODANTIN AND PENICILLIN AS WELL AS VALIUM.   MEDICATIONS: According to medical records, the patient is on: Acetaminophen and hydrocodone 325 mg /5 mg 1 tablet 3 times daily as needed, amlodipine 5 mg p.o. twice daily, Benicar 40 mg p.o. daily, Coreg CR 10 mg p.o. daily, Gas-Ex 2 tablets twice daily, hydralazine 50 mg 3 times, hydrocortisone 10 mg p.o. twice daily, levothyroxine 125 mcg p.o. daily, Zofran 4 mg orally every 8 hours as needed, pantoprazole 40 mg p.o. twice daily, polyethylene glycol 3350 oral  powder for reconstitution 17 g once daily as needed, Senexon S 50/8.6 mg 2 tablets once daily at bedtime, Tylenol Arthritis 1 capsule 3 times daily, venlafaxine 37.5 mg once daily (this medication is discontinued) and vitamin D3 2000 units once a day. It is unclear if she is taking any other medications.   SOCIAL HISTORY: Widowed. Nonsmoker. No alcohol abuse.   FAMILY HISTORY: Uncle with colon cancer.   REVIEW OF SYSTEMS:  CONSTITUTIONAL: Pains in the abdomen diffusely, which she describes as gas pains, weight gain she feels because of left lower extremity swelling, poor sleep which she attributes to restless leg syndrome, bilateral cataract removal, postnasal drip, some sinus congestion, but nothing recently. Intermittent wheezing in the lungs, shortness of breath, especially on exertion, chest pain and chest pressure on arrival to the Emergency Room, lower extremity edema on the left side around the ankle. Also feeling presyncopal earlier and syncopizing at home and nauseated, having abdominal pains as well as severe constipation. She also admits of having irritable bowel syndrome. She had her last bowel movement on Sunday. She admits of having left ankle swelling, more than right ankle swelling. Significant arthritis in her knees, shoulders and fingers, for which she takes Tylenol. Otherwise, denies any fevers, chills, fatigue, weakness, weight loss.  EYES:  Denies blurry vision, double vision, or glaucoma.  EARS, NOSE, THROAT: Denies any tinnitus, allergies, epistaxis, sinus pain, dentures or difficulty swallowing.  RESPIRATORY: Denies any cough, hemoptysis, asthma, or COPD.  CARDIOVASCULAR: Denies any orthopnea, edema, arrhythmias, or palpitations.  GASTROINTESTINAL:  Denies any vomiting or diarrhea or overall change in bowel habits although admits  that because of irritable bowel syndrome, changes between constipation as well as diarrhea.  GENITOURINARY: Denies any dysuria, hematuria, frequency or  incontinence.  ENDOCRINE: Denies any polydipsia, nocturia, thyroid problems, heat or cold intolerance or thirst. HEMATOLOGIC: Denies any anemia, easy bruising, bleeding or swollen glands.  SKIN: Denies acne, rashes or changes in moles.  MUSCULOSKELETAL: Denies arthritis, cramps or swelling.  NEUROLOGIC: No numbness, epilepsy or tremor.  PSYCHIATRIC: Denies anxiety, insomnia, depression.  PHYSICAL EXAMINATION:  VITAL SIGNS: On arrival to the hospital, temperature was 98, pulse was 151, respirations were 26, blood pressure 93/43, saturation was 96% on room air.  GENERAL: This is a well-developed, well-nourished, obese, Caucasian female in no significant distress lying on the stretcher.  HEENT: Her pupils are equal, reactive to light. Extraocular movements intact. No icterus or conjunctivitis. Has normal hearing. No pharyngeal erythema. Mucosa is moist.  NECK: No masses. Supple, nontender. Thyroid is not enlarged. No adenopathy. No JVD. Carotid bruits bilaterally.  Full range of motion.  LUNGS: Clear to auscultation anteriorly. No rales, rhonchi or difficulty breathing. No labored inspirations. There is increased effort. No dullness to percussion, overt respiratory distress.  CARDIOVASCULAR: S1, S2 appreciated. Rhythm was irregular irregular. No murmurs, gallops or rubs noted. Tachycardic. Chest was nontender to palpation.  There were 1+ pedal pulses.  EXTREMITIES: No significant lower extremity edema. However, mild swelling around the left ankle was noted. No calf tenderness or cyanosis was noted.  ABDOMEN: Soft, protuberant, minimall tender diffusely with no appreciable hepatomegaly or masses noted.  Bowel sounds are present. RECTAL: Deferred.  MUSCULOSKELETAL:  Muscle strength: Able to move all extremities. No cyanosis, degenerative joint disease. Mild kyphosis. Gait is not tested.  SKIN: Did not reveal any rashes, lesions, erythema, nodularity or induration. It was warm and dry to palpation.   LYMPHATIC: No adenopathy in the cervical region.  NEUROLOGIC: Cranial nerves grossly intact. Sensory is intact. No dysarthria or aphasia. The patient is alert, oriented to time, person and place, cooperative. Memory is good. No signs of confusion, agitation or depression.    LABORATORY DATA: BMP showed glucose of 165. Beta-type natriuretic peptide was 4040. Sodium 119, otherwise BMP was unremarkable. The patient's CK-MB was 9.3. Troponin was 1.3. White blood cell count was elevated to 12.0, hemoglobin was 11.5, platelet count 408,000. Pro-time and INR 13.6 and 1.1 respectively. Urinalysis pending.   EKG showed atrial fibrillation at 130 beats per minute. Rapid ventricular response. No acute ST-T changes were noted.   ASSESSMENT AND PLAN:  1. Atrial fibrillation, rapid ventricular response. Admit the patient to the medical floor. Continue her on a Cardizem IV drip, and add metoprolol. The patient will be started on heparin subcutaneous because of concerns of heart attack/myocardial infarction. We will get the cardiologist involved for further recommendations.  2. Elevated troponin. Continue aspirin. Add metoprolol. Will continue heparin and cardiology consultation.  3. 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 The patient seems to be a little dehydrated. We will try normal saline solution for a few hours, then will check sodium level at around 3:00 p.m.  4. Hyperglycemia. Will get hemoglobin A1c to rule out diabetes mellitus as a complicating factor of her condition.   TIME SPENT: 50 minutes     ____________________________ Katharina Caper, MD rv:MT [name]: 06/24/2014 13:08:00 ET T: 06/24/2014 14:17:16 ET JOB#: [id number]  cc: Katharina Caper, MD, <Dictator>  Maebel Marasco MD ELECTRONICALLY SIGNED 07/21/2014 15:09

## 2015-02-13 NOTE — Consult Note (Signed)
Chief Complaint:  Subjective/Chief Complaint seen for n/v abdominal pain.  mild nausea, vo emesis today, feels like she has to have a bm.   VITAL SIGNS/ANCILLARY NOTES: **Vital Signs.:   27-Nov-15 05:43  Vital Signs Type Routine  Temperature Temperature (F) 98.1  Celsius 36.7  Temperature Source oral  Pulse Pulse 75  Respirations Respirations 20  Systolic BP Systolic BP 154  Diastolic BP (mmHg) Diastolic BP (mmHg) 82  Mean BP 106  Pulse Ox % Pulse Ox % 91  Pulse Ox Activity Level  At rest  Oxygen Delivery Room Air/ 21 %   Brief Assessment:  Cardiac Regular   Respiratory clear BS   Gastrointestinal details normal Soft  Nondistended  positive bowel sounds, tenderness reported left abdomen, minimal tenderness to palpation.   Lab Results: Routine Hem:  26-Nov-15 04:54   WBC (CBC) 5.6  RBC (CBC)  3.39  Hemoglobin (CBC)  8.9  Hematocrit (CBC)  28.7  Platelet Count (CBC) 275  MCV 85  MCH 26.2  MCHC  30.9  RDW  19.5  Neutrophil % 63.3  Lymphocyte % 21.7  Monocyte % 11.1  Eosinophil % 2.6  Basophil % 1.3  Neutrophil # 3.5  Lymphocyte # 1.2  Monocyte # 0.6  Eosinophil # 0.1  Basophil # 0.1 (Result(s) reported on 17 Sep 2014 at [facility].)   Radiology Results: CT:    23-Nov-15 20:24, CT Abdomen and Pelvis With Contrast  CT Abdomen and Pelvis With Contrast   REASON FOR EXAM:    left abd pain  COMMENTS:       PROCEDURE: CT  - CT ABDOMEN / PELVIS  W  - Sep 14 2014  8:24PM     CLINICAL DATA:  Left abdominal pain.    EXAM:  CT ABDOMEN AND PELVIS WITH CONTRAST    TECHNIQUE:  Multidetector CT imaging of the abdomen and pelvis was performed  using the standard protocol following bolus administration of  intravenous contrast.  CONTRAST:  100 cc Isovue-300 intravenous    COMPARISON:  09/08/2014    FINDINGS:  BODY WALL: Unremarkable.    LOWER CHEST: Numerous bilateral pulmonary nodules, up to 12 mm at  the medial right base.    ABDOMEN/PELVIS:    Liver: No  focal abnormality.    Biliary: Cholecystectomy.  No change to suggest biliary obstruction.  Pancreas: Unremarkable.    Spleen: Unremarkable.    Adrenals: Unremarkable.    Kidneys and ureters: No urinary obstruction. On delayed phase the  renal enhancement is somewhat patchy, but not typical of a striated  nephrogram/pyelonephritis.    Bladder: Unremarkable.    Reproductive: Unremarkable for age.  Pelvic floor laxity.    Bowel: No bowel obstruction or inflammatory change. No acute  appendicitis. Distal colonic diverticulosis.    Retroperitoneum: No mass or adenopathy.    Peritoneum: No ascites or pneumoperitoneum.    Vascular: Diffuse aortic and branch vessel atherosclerosis. No  change from prior to explain acute symptoms.    OSSEOUS: Status post posterior rod and pedicle screw fixation from  L2-L5. Compression fractures of L2 and L3 results in extension of  the pedicle screws into the L1-2 and L2-3 discs, respectively. There  is interbody fusion at L4-5 which appears complete.    T9 compression fracture with ~75% height loss. Unhealed fracture  planes remain visible.    There is a cleft of gas within the fractured and compressed (~50%)  L1 body compatible with unhealed fracture.    Diffuse degenerative disc and facet  disease. There is a gas  containing disc herniation at T9-10 narrowing the ventral canal.     IMPRESSION:  1. No acute intra-abdominal findings to explain left upper quadrant  pain.  2. Numerous compression fractures, acute/unhealed at T9 and L1.  Compression deformity is unchanged since 09/08/2014.  3. Chronic, large disc herniation at T9-10.  4. Multiple pulmonary nodules, up to 12 mm in the right lower lobe.  See recommendations on 09/08/2014 abdominal CT.      Electronically Signed    By: Tiburcio Pea M.D.    On: 09/14/2014 21:17         Verified By: Audry Riles, M.D.,    [phone] 14:32, CT Angiography Abdomen/Pelvis W/WO Contrast   CT Angiography Abdomen/Pelvis W/WO Contrast   REASON FOR EXAM:    evaluate for ischemic bowel  COMMENTS:       PROCEDURE: CT  - CT ANGIOGRAPHY ABD/PEL W/WO  - Sep 16 2014  2:32PM     CLINICAL DATA:  81 year old female with abdominal pain. Evaluate for  mesenteric ischemia.    EXAM:  CTA ABDOMEN AND PELVIS WITHOUT CONTRAST    TECHNIQUE:  Multidetector CT imaging of the abdomen and pelvis was performed  using the standard protocol during bolus administration of  intravenous contrast. Multiplanar reconstructed images and MIPs were  obtained andreviewed to evaluate the vascular anatomy.    CONTRAST:  100 Isovue 370 administered intravenously    COMPARISON:  Recent CT scan of the abdomen and pelvis 09/14/2014    FINDINGS:  VASCULAR    Aorta: Normal caliber aorta with minimal atherosclerotic plaque. No  evidence of the dissection or penetrating ulcer.    Celiac: Heterogeneous atherosclerotic plaque at the origin may  result in trace narrowing but no hemodynamically significant  stenosis. Conventional hepatic arterial anatomy. No hepatic or  splenic artery aneurysm.    SMA: Widely patent.    Renals: Calcified atherosclerotic plaque in the proximal right renal  artery may results in mild narrowing. There is trace plaque on the  left without evidence of significant stenosis. No changes to suggest  fibromuscular dysplasia. Single dominant renal arteries are present  bilaterally.    IMA: Widely patent and unremarkable.    Inflow: Relatively disease free.  Widely patent.    Proximal Outflow: Widely patent and relatively disease free.  Veins: No focal venous abnormality. Hepatic, portal, splenic,  mesenteric and renal veins are all widely patent.    NON-VASCULAR    Lower Chest: Multiple bilateral pulmonary nodules the largest which  measures 11 mm in inferomedial right lower lobe. This is slightly  enlarged compared to 0.9 cm in 2013. The vast majority of the other  nodules  remain unchanged. Mild dependent atelectasis.  Atherosclerotic calcifications are present within the coronary  arteries. No pericardial effusion. Unremarkableat that small hiatal  hernia.    Abdomen: Unremarkable CT appearance of the stomach, duodenum,  spleen, adrenal glands and pancreas. Normal hepatic contour and  morphology. No discrete hepatic lesion. Trace intrahepatic biliary  ductal dilatation. The gallbladder is surgically absent. The  extrahepatic bile duct is not dilated. No filling defects.    Unremarkable appearance of the bilateral kidneys. No focal solid  lesion, hydronephrosis or nephrolithiasis. There is a renal size  discrepancy withthe left kidney slightly smaller than the right.    Scattered colonic diverticula without active inflammation. Mild  focal thickening of the transverse colon is favored to reflect  peristalsis given the absence of this finding and recent prior  imaging in the normal caliber wall proximal and distal to the  abnormality. The appendix is in normal. No free fluid or suspicious  adenopathy.  Pelvis: Unremarkable bladder, uterus and adnexa. No free fluid or  suspicious adenopathy. Moderate pelvic floorlaxity.    Bones/Soft Tissues: Status post posterior rod and pedicle screw  fixation from L2-L5. Compression fractures of L2 and L3 results in  extension of the pedicle screws into the L1-2 and L2-3 discs,  respectively. There is interbody fusion at L4-5 which appears  complete.  T9 compression fracture with 75% height loss. Unhealed fracture  planes remain visible. There is a cleft of gas within the fractured  and compressed (50%) L1 body compatible with unhealed fracture.  Diffuse degenerative disc and facet disease. There is a gas  containing disc herniation at T9-10 narrowing the ventral canal.    Subacute fracture of the right ischial tuberosity again noted.  Review of the MIP images confirms the above findings.      IMPRESSION:  VASCULAR    1. Mild atherosclerotic vascular disease without aneurysmal  dilatation or significant stenosis.  NON VASCULAR    1. Segment of mild circumferential wall thickening in the transverse  colon is favored to reflect a region of focal peristalsis giventhe  absence of this finding on a recent prior imaging. However, an early  circumferential neoplastic process is difficult to exclude entirely.  Recommend correlation with prior colonoscopy results. If the patient  has not had a colonoscopy in the past 5 years common GI consultation  may be considered.  2. Multiple bilateral pulmonary nodules the largest which measures  1.1 cm in the right lower lobe demonstrates slow enlargement  compared to 2013. Please see recommendations as discussed on the  09/08/2014 CT scan.  3. Multiple additional ancillary findings as above without  significant interval change over the 2 days since the prior study.  Signed,    Sterling BigHeath K. McCullough, MD    [facility]    [facility]  Electronically Signed    By: Malachy MoanHeath  McCullough M.D.    On: 09/16/2014 14:59         Verified By: Sterling BigHEATH K. MCCULLOUGH, M.D.,   Assessment/Plan:  Assessment/Plan:  Assessment 1) left sided/lower abdominal pain.  CT and CTA uninformative for intra-abdominal source.  possible radicular pain given vertebral compression fx at L1, T9.  small stool yesterday after enema.  2) multiple medical issues with Hypertension, osteoarthritis, hypothyroidism, chronic adrenal insufficiency on chronic steroid, history of chronic atrial fibrillation on Eliquis, history of hyponatremia, chronic back pain, history of diverticulitis.   Plan 1) plans for kyphoplasty monday noted.  recommend continuing bowel regimen with senakot and bid miralax daily.  Dr Shelle Ironein to follow tomorrow.   Electronic Signatures: Barnetta ChapelSkulskie, Martin (MD)  (Signed [phone]-Nov-15 12:11)  Authored: Chief  Complaint, VITAL SIGNS/ANCILLARY NOTES, Brief Assessment, Lab Results, Radiology Results, Assessment/Plan   Last Updated: 27-Nov-15 12:11 by Barnetta ChapelSkulskie, Martin (MD)

## 2015-02-13 NOTE — H&P (Signed)
PATIENT NAME:  [name], Nakiesha D MR#:  [id number] DATE OF BIRTH:  [birth year]  DATE OF ADMISSION:  12/05/2013  She is an 81 year old female, presenting with sigmoid diverticulitis with diverticular abscess. She had similar diverticulitis approximately 1 month ago was treated for a week with IV antibiotics. Symptoms resolved. She went to skilled nursing and came home and about a week later started having left lower quadrant pain and was treated with oral Cipro and Flagyl. She had profound nausea and vomiting from that with dehydration and progressive pain in the left lower quadrant. She came to the office this morning where CT shows now a diverticular abscess with a focal diverticulitis with her lack of response to outpatient therapy and dehydration with nausea and vomiting. She will be admitted for further evaluation and treatment.   PAST MEDICAL HISTORY/MEDICAL ILLNESSES:  1.  Hypothyroidism.  2.  Lumbar disk disease.  3.  Hypertension, severe.  4.  Restless leg syndrome.   PAST SURGICAL HISTORY:  1.  Carpal tunnel.  2.  Cholecystectomy. 3.  LS laminectomy x 3.  4.  Left total knee replacement.   ALLERGIES: CODEINE, DIURETICS, MACRODANTIN, PENICILLIN, VALIUM AND ZIAC.   FAMILY HISTORY: Uncle with colon cancer.  SOCIAL HISTORY:  No smoking. No alcohol. Married with 3 kids.   MEDICATIONS: Hydrocortisone 10 mg 1-1/2 daily, Synthroid 112 mcg daily, vitamin D 2000 units daily, hydralazine 50 mg t.i.d., Protonix 40 mg b.i.d., Benicar 40 mg daily, Align daily, Toprol-XL 25 mg b.i.d, Coreg CR 20 mg at bedtime, Norvasc 5 mg b.i.d., stool softener daily, Carafate 1 gram b.i.d. and Claritin 10 mg daily.  PHYSICAL EXAMINATION:   VITAL SIGNS: Blood pressure 122/50, pulse 80, afebrile.  HEENT: Pupils are equal and reactive to light and accommodation.  LUNGS: Clear to auscultation and percussion.  HEART: Regular rhythm.  ABDOMEN: Moderate tenderness left lower quadrant, more than right lower quadrant,  questionable rebound. Bowel sounds present.  EXTREMITIES: No edema.  SKIN: No rash.  NEUROLOGIC: Grossly nonfocal.   LABS: Shows white count 15,000. CT shows descending sigmoid colon diverticulitis with small abscess.   ASSESSMENT AND PLAN:  1.  Diverticulitis -  treated with intravenous meropenem as a distant penicillin reaction to hives. We will follow closely to see if the Flagyl causes significant nausea for her. 2.  Hyponatremia - with dehydration. Will give intravenous fluids.  3.  Hypertension - continue medications. May need to back down on her medications with the infection.  4.  Lumbar disk disease - morphine p.r.n. Ultimately, could use Norco.   PROGNOSIS: Guarded.   ____________________________ Danella PentonMark F. Miller, MD mfm:aw D: 12/05/2013 12:38:21 ET T: 12/05/2013 12:44:01 ET JOB#: [id number]  cc: Danella PentonMark F. Miller, MD, <Dictator> MARK Sherlene ShamsF MILLER MD ELECTRONICALLY SIGNED 12/08/2013 7:46

## 2015-02-13 NOTE — Consult Note (Signed)
PATIENT NAME:  [name], [name] MR#:  [id number] DATE OF BIRTH:  [birth year]  DATE OF CONSULTATION:  06/24/2014  REFERRING PHYSICIAN:   CONSULTING PHYSICIAN:  Marcina MillardAlexander Marianne Golightly, MD  PRIMARY CARE PHYSICIAN: Dr. Hyacinth MeekerMiller.  CARDIOLOGIST: Harold HedgeKenneth Fath, MD  CHIEF COMPLAINT: "My heart was racing."   HISTORY OF PRESENT ILLNESS: The patient is an 81 year old female referred for evaluation of atrial fibrillation. The patient was recently hospitalized for hyponatremia due to apparent adrenal insufficiency. The patient reports that she was in her usual state of health until last evening when she had some recurrent episodes of palpitations. Today the patient had heart racing, presented to [facility] where she was noted to be in atrial fibrillation with a rapid ventricular rate of a rate of 140 to 160 BPM. The patient denies chest pain. She was started on intravenous Cardizem with improvement in heart rate. Admission labs were notable for a sodium of 119 and troponin of 1.6.   PAST MEDICAL HISTORY:  1.  Apparent history of atrial fibrillation approximately 6-7 years ago.  2.  Hyponatremia.  3.  Hyperlipidemia.  4.  Hypothyroidism. 5.  History of DVT.  6.  Hypertension.  7.  History of TIAs.   MEDICATIONS: Metoprolol succinate 50 mg b.i.d., hydralazine 25 mg t.i.d., Benicar 40 mg daily, Norvasc 5 mg b.i.d., carvedilol CR 250 mg daily, Synthroid 112 mcg daily, vitamin D 1 capsule daily, Protonix 40 mg daily, hydrocortisone 10 mg b.i.d. Mucinex 2 tablets daily, sucralfate 1 gram daily, Claritin 10 mg daily, acetaminophen 325 mg q. 4 p.r.n.   SOCIAL HISTORY: The patient currently is at [facility].   FAMILY HISTORY: No immediate family history of coronary artery disease or myocardial infarction.   REVIEW OF SYSTEMS: CONSTITUTIONAL: No fever or chills.  EYES: No blurry vision.  EARS: No hearing loss.  RESPIRATORY: No shortness of breath.  CARDIOVASCULAR: Palpitations as  described above.  GASTROINTESTINAL: The patient had some nausea this morning.  GENITOURINARY: No dysuria or hematuria.  ENDOCRINE: No polyuria or polydipsia.  MUSCULOSKELETAL: No arthralgias or myalgias.  NEUROLOGICAL: No focal muscle weakness or numbness.  PSYCHOLOGICAL: No depression or anxiety.   PHYSICAL EXAMINATION:  VITAL SIGNS: Blood pressure 120/71, pulse 110 to 120 irregularly irregular, respirations 20, temperature 97.8, pulse oximetry 99%.  HEENT: Pupils equal and reactive to light and accommodation.  NECK: Supple without thyromegaly.  LUNGS: Clear.  HEART: Normal JVP. Normal PMI. Irregularly irregular rhythm. Normal S1, S2. No appreciable gallop, murmur or rub.  ABDOMEN: Soft, nontender. Pulses were intact bilaterally.  MUSCULOSKELETAL: Normal muscle tone.  NEUROLOGIC: The patient is alert and oriented x 3. Motor and sensory both grossly intact.   IMPRESSION: An 81 year old female who presents with atrial fibrillation with a rapid ventricular rate which appears to be improved on diltiazem drip. The patient is currently on heparin drip for stroke prevention. She has a CHADS2 score of 4 and likely is a candidate for long-term chronic anticoagulation.   RECOMMENDATIONS:  1.  Agree with overall current therapy.  2.  Continue diltiazem drip until rate better controlled.  3.  Continue metoprolol q. 6.  4.  Continue heparin drip for now. Will likely convert to chronic long-term anticoagulation.  5.  Two-D echocardiogram.  6.  Would defer invasive cardiac evaluation. Elevated troponin likely due to demand supply ischemia in the absence of chest pain.  7.  Further recommendations pending the patient's initial clinical course and echocardiogram results.   ____________________________ Marcina MillardAlexander Baron Parmelee, MD ap:TT D:  06/24/2014 16:50:19 ET T: 06/24/2014 17:38:56 ET JOB#: [id number]  cc: Marcina Millard, MD, <Dictator> Marcina Millard MD ELECTRONICALLY SIGNED 06/25/2014  16:02

## 2015-02-13 NOTE — Discharge Summary (Signed)
PATIENT NAME:  Laura LoraWOOD, Jenne D MR#:  [id number] DATE OF BIRTH:  07-30-1929  DATE OF ADMISSION:  12/05/2013 DATE OF DISCHARGE:  12/12/2013  DISCHARGE DIAGNOSES:  1.  Diverticular abscess.  2.  Hyponatremia.  3.  Hypertension.  4.  Adrenal insufficiency.  5.  Irritable bowel syndrome.   DISCHARGE MEDICATIONS: Align 4 mg daily, vitamin D3 2000 units daily, hydralazine 75 mg t.i.d., Protonix 40 mg b.i.d., Benicar 40 mg daily, Coreg CR 20 mg at bedtime, Claritin 10 mg daily, Toprol-XL 50 mg b.i.d., hydrocortisone 10 mg b.i.d., Levaquin 500 mg daily x 10 days, Synthroid 137 mcg daily, amlodipine 5 mg b.i.d. and dicyclomine 10 mg b.i.d.   REASON FOR ADMISSION: An 81 year old female presents with significant left lower quadrant abdominal pain, nausea and diarrhea. Please see H and P for history of present illness, past medical history and physical exam.   HOSPITAL COURSE: The patient was admitted. She received 7 days of IV meropenem. CT of the abdomen showed left lower quadrant diverticular abscess. Her symptoms did improve throughout the hospitalization and her white count normalized. She was still having some loose bowels and Bentyl was added with improvement. Blood pressure overall was reasonably controlled. She will go home on 10 days of p.o. Levaquin to follow up with Dr. Hyacinth MeekerMiller in 1 week.  ____________________________ Danella PentonMark F. Miller, MD mfm:aw D: 12/15/2013 07:59:25 ET T: 12/15/2013 10:36:31 ET JOB#: [id number]  cc: Danella PentonMark F. Miller, MD, <Dictator> MARK Sherlene ShamsF MILLER MD ELECTRONICALLY SIGNED 12/16/2013 8:41

## 2015-02-13 NOTE — Consult Note (Signed)
Chief Complaint:  Subjective/Chief Complaint The feeling much better today for more strength for would like to get out of bedMore alert today with improved nausea also. Denies cp.  improved nausea better appetite   VITAL SIGNS/ANCILLARY NOTES: **Vital Signs.:   07-Sep-15 11:00  Pulse Pulse 96  Respirations Respirations 27  Systolic BP Systolic BP 811  Diastolic BP (mmHg) Diastolic BP (mmHg) 55  Mean BP 71  Pulse Ox % Pulse Ox % 95  Oxygen Delivery 2L  *Intake and Output.:   07-Sep-15 11:00  Grand Totals Intake:  216.6 Output:      Net:  216.6 24 Hr.:  116.4  IV (Primary)      In:  16.6   Brief Assessment:  GEN well developed, well nourished, no acute distress, obese   Cardiac Irregular  murmur present  --Gallop   Respiratory normal resp effort  clear BS   Gastrointestinal details normal Soft  Bowel sounds normal   EXTR negative cyanosis/clubbing   Lab Results: Routine Chem:  06-Sep-15 05:49   Sodium, Serum  127  Creatinine (comp) 0.60  eGFR (African American) >60  eGFR (Non-African American) >60 (eGFR values <45mL/min/1.73 m2 may be an indication of chronic kidney disease (CKD). Calculated eGFR is useful in patients with stable renal function. The eGFR calculation will not be reliable in acutely ill patients when serum creatinine is changing rapidly. It is not useful in  patients on dialysis. The eGFR calculation may not be applicable to patients at the low and high extremes of body sizes, pregnant women, and vegetarians.)  Glucose, Serum 98  BUN 7  Potassium, Serum 3.6  Chloride, Serum  95  CO2, Serum 26  Calcium (Total), Serum 8.5  Anion Gap  6  Osmolality (calc) 253   Radiology Results: XRay:    02-Sep-15 12:21, Chest Portable Single View  Chest Portable Single View   REASON FOR EXAM:    shortness of breath, tachycardia  COMMENTS:       PROCEDURE: DXR - DXR PORTABLE CHEST SINGLE VIEW  - Jun 24 2014 12:21PM     CLINICAL DATA:  Chest pain and  tachycardia.  Shortness of breath    EXAM:  PORTABLE CHEST - 1 VIEW    COMPARISON:  05/16/2014    FINDINGS:  No cardiomegaly when considering technique. Upper mediastinal  contours are stable from previous. Low lung volumes with  interstitial crowding and minimal atelectasis at the right base. No  interstitial edema, definitive pneumonia, effusion, or pneumothorax.     IMPRESSION:  Low lung volumes without edema or pneumonia.      Electronically Signed    By: Jorje Guild M.D.    On: 06/24/2014 12:43         Verified By: Gilford Silvius, M.D.,  Cardiology:    02-Sep-15 10:38, ED ECG  Ventricular Rate 134  Atrial Rate 133  QRS Duration 72  QT 250  QTc 373  R Axis 26  T Axis 58  ECG interpretation   Atrial fibrillation with rapid ventricular response  Abnormal ECG  When compared with ECG of 29-Mar-2014 09:46,  Atrial fibrillation has replaced Sinus rhythm  Vent. rate has increased BY  64 BPM  ----------unconfirmed----------  Confirmed by OVERREAD, NOT ([id number]), editor PEARSON, BARBARA ([id number]) on 06/25/2014 10:36:08 AM  ED ECG    Assessment/Plan:  Invasive Device Daily Assessment of Necessity:  Does the patient currently have any of the following indwelling devices? foley   Assessment/Plan:  Assessment IMP AFIB  Hypo Na AMS Abd discomfort Nausea Weakness DJD .   Plan PLAN  switch to p.o. amiodarone  continue to correct hyponatremia Rate control with metoprolol and dilt Rhythm control with amiodarone agree with anti nausea therapy DVT PROPHYLAXIS Physical therapy I do not rec cardioversion at this point  transferred to telemetry   Electronic Signatures: Yolonda Kida (MD)  (Signed 07-Sep-15 14:04)  Authored: Chief Complaint, VITAL SIGNS/ANCILLARY NOTES, Brief Assessment, Lab Results, Radiology Results, Assessment/Plan   Last Updated: 07-Sep-15 14:04 by Yolonda Kida (MD)

## 2015-02-13 NOTE — H&P (Signed)
PATIENT NAME:  [name], [name] MR#:  [id number] DATE OF BIRTH:  [birth year]  DATE OF ADMISSION:  04/02/2014  PRIMARY CARE PHYSICIAN: Dr. Bethann PunchesMark Miller.   REFERRING PHYSICIAN:     CHIEF COMPLAINT: Abdominal pain.   HISTORY OF PRESENT ILLNESS: The patient is a pleasant 81 year old female with history of diverticulitis with diverticular abscess in the past earlier in the year, history of hyponatremia with adrenal insufficiency and chronic steroids, hypertension, who comes in for worsening abdominal pain. Of note, the patient came into the hospital on the 7th was noted to have diverticulitis, was started on Cipro Flagyl. She could not tolerate Cipro and had significant nausea and vomiting and that was changed to Levaquin on Monday. The patient had persistent diarrhea on Monday and Tuesday, but had no bowel movement on Wednesday. Since the last 24 hours the patient has worsening abdominal pain and did not sleep much last night. She has chills without any fevers. She has been having poor p.o. intake and cannot tolerate much. Therefore, she came into the hospital. Hospitalist services were contacted for further evaluation and management.   PAST MEDICAL HISTORY: History of diverticulitis with abscess in the past, lumbar disk disease, status post multiple back surgeries, severe hypertension, restless leg syndrome, arthritis, adrenal insufficiency in the past, history of hyponatremia in the past, carpal tunnel surgery, cholecystectomy, left total knee replacement.   ALLERGIES: CODEINE, FUROSEMIDE,  MACRODANTIN, PENICILLIN, VALIUM, ZIAC, STRAWBERRY, ADHESIVE.   FAMILY HISTORY: Grandfather with possible kidney cancer. Uncle with colon cancer.   SOCIAL HISTORY: No tobacco, alcohol or drug use. Married, at this time lives-by herself.   OUTPATIENT MEDICATIONS: Tylenol p.r.n., but since Tylenol was not working she has been taking Aleve twice a day, acetaminophen/oxycodone 325/5 mg 1/2 tab every 4 to 6 hours as  needed,  align 4 mg daily, amlodipine 5 mg 2 times a day, Benicar 40 mg daily, Claritin 10 mg once a day, Coreg CR 20 mg once a day, hydralazine 75 mg 3 times a day, hydrocortisone 10 mg 2 times a day, Levaquin 500 mg daily, levothyroxine 112 mcg daily, metoprolol succinate 50 mg 2 times a day, Flagyl 500 mg 2 times a day, Protonix 40 mg daily, Sucralfate 1 gram 2 times a day, vitamin D3 2000 units once a day.   REVIEW OF SYSTEMS:  GENERAL: No fever. Positive for chills, no weight changes.  EYES: No blurry vision or double vision.  ENT: No tinnitus, has decreased hearing.  RESPIRATORY: No cough, wheezing, shortness of breath or dyspnea on exertion.  CARDIOVASCULAR: No chest pain or palpitations.  GASTROINTESTINAL: Nausea and vomiting as above, abdominal pain, which is mostly in the left lower side, but has a tendency to shoot across the belly. No radiation. Positive for black stools for some time which she can not tell me how long, but she thinks maybe it is a a few weeks. No bloody stools.  GENITOURINARY: Denies dysuria, hematuria.  HEMATOLOGIC AND LYMPHATIC: No anemia or easy bruising.  SKIN: No rashes.  MUSCULOSKELETAL: Has chronic arthritis. NEUROLOGICAL:  No focal weakness or numbness.  PSYCHIATRIC: Denies anxiety or insomnia.   PHYSICAL EXAMINATION: VITAL SIGNS: Temperature on arrival 97.7, pulse rate 76, respiratory rate 22, blood pressure 135/82, oxygen saturation 99%.  GENERAL: The patient is a well-developed female lying in bed, no obvious distress.  HEENT: Normocephalic, atraumatic. Pupils are equal and reactive. Anicteric sclerae. Dry mucous membranes.  NECK: Supple. No thyroid tenderness. No cervical lymphadenopathy.  CARDIOVASCULAR: S1, S2 regular. No significant  murmurs appreciated.  LUNGS: Clear to auscultation without wheezing, rhonchi, or rales.  ABDOMEN: Soft. Positive for tenderness, mostly in the left lower quadrant. No rebound or guarding. Hyperactive bowel sounds in all  quadrants.  EXTREMITIES: No pitting edema.  NEUROLOGIC: Cranial nerves II through XII grossly intact. Strength is five out of five in all extremities. Sensation is intact to light touch. PSYCHIATRIC: Awake,  alert, oriented  x 3.   LABORATORIES AND IMAGING: Glucose 109, BUN 10, creatinine 0.66. Sodium 123, of note it was 132 on 06/07.  Potassium 3.9. LFTs with normal limits. Troponin negative. White count 8.3, hemoglobin 12.5, platelets 423. Urinalysis does not suggest infection. Of note, there is a CAT scan abdomen and pelvis with contrast on 06/07 showing maybe trace of mild interstitial stranding in the perisigmoid fat at the site of prior diverticulitis. This could reflect very early recurrent diverticulitis. No evidence of perforation or abscess. Numerous bilateral small pulmonary nodules remain unchanged going back to 2009, left renal cortical atrophy, colonic diverticular disease without CT evidence of acute inflammation, marked pelvic floor laxity, stable right ischial tuberosity fracture with numerous thoracolumbar compression fractures, scattered atherosclerotic vascular calcification, nonspecific 8 mm  solid ground glass attenuation nodular or opacity.   ASSESSMENT AND PLAN:  1. We have an 81 year old with history of diverticular abscess in the setting of diverticulitis in the past, hyponatremia, adrenal insufficiency and chronic steroids, who was recently started on Cipro Flagyl for possible early diverticulitis, who has had progressive pain and failed outpatient therapy.  We will admit the patient to the hospital. Furthermore, the patient has what appears to be acute on chronic hyponatremia, likely in the setting of poor p.o. intake. Would admit the patient to the hospital. As she has worsened with Cipro,  then Levaquin and Flagyl, at this point, we would start the patient on IV meropenem and obtain a CT to see if the patient has developed an abscess as she has a history of it in the past.  We  will  make the patient n.p.o. except meds. Start the patient on normal saline for acute on chronic hyponatremia. Given that she is on steroids and she having  stressful situation, we would start the patient  on IV stress dose of steroids at this time as well.  2. For the hypertension, would continue the medications for blood pressure, but it appears that she is on Coreg as well as metoprolol. Would continue the Coreg, hold the metoprolol to prevent  bradycardia. Start the patient on Zofran and morphine, and if the patient does not get better, obtain surgical consult.  3. In regards to the dark stools. The patient's hemoglobin is stable. I do not know the significance of that, but would watch over her hemoglobin.   TOTAL TIME SPENT: About 45 minutes.    ____________________________ Krystal Eaton, MD sa:sg [name]: 04/02/2014 13:21:00 ET T: 04/02/2014 13:37:24 ET JOB#: [id number]  cc: Krystal Eaton, MD, <Dictator> Danella Penton, MD  [facility] MD ELECTRONICALLY SIGNED 04/21/2014 18:16

## 2015-02-13 NOTE — H&P (Signed)
PATIENT NAME:  [name], [name] MR#:  [id number] DATE OF BIRTH:  1929/08/22  PRIMARY CARE PHYSICIAN:  Danella Penton, MD  CHIEF COMPLAINT: I have low sodium.  HISTORY OF PRESENT ILLNESS: [name] is an 81 year old Caucasian female with multiple medical problems including hypertension, hypothyroidism, chronic afib on Eliquis. History of hyponatremia on and off, etiology unclear on that.  Is admitted from Dr. Alice Rieger office with sodium of 124.  The patient states she has not been feeling well, fatigued, and saw Dr. Wynelle Link yesterday in the office.  Her sodium was down to 124. She is being admitted for further evaluation and management. She also complains of some abdominal pain.  She states she was constipated last Sunday; however, took her bowel prep and had bowel movement; however, she continues to have some pain in abdomen.  Denies any fever, shortness of breath, or cp    PAST MEDICAL HISTORY:  1. Chronic adrenal insufficiency on chronic steroids, managed by Dr.Miller 2.  Hypertension.   3.  Osteoarthritis. 4.  Hypothyroidism. 5.  History of chronic atrial fibrillation, on Eliquis. 6.  History of hyponatremia.   7.  Chronic back pain. 8.  Osteoarthritis. 9.  History of diverticulitis.   PAST SURGICAL HISTORY:   1.  Lumbar laminectomy x 4.  2.  Laparotomy . 3.  Carpal tunnel surgery. 4.  Cholecystectomy.  SOCIAL HISTORY: She is a widow. She lives at [facility].  No smoking or alcohol.   FAMILY HISTORY:  Positive for colon cancer.    ALLERGIES: SULFA DRUGS, CODEINE,  FUROSEMIDE, VALIUM, ZIAC.   HOME MEDICATIONS: 1.    Pramipexole 1 mg p.o. daily.  2.  Potassium 8 mEq 1 p.o. daily.  3.  Norco 5/325 one every 6 hours.  4.  Metoprolol 50 mg every 6 hours.  5.  Levothyroxine 125 mcg p.o. daily.  6.  Hydrocortisone 5 mg 3 tablets that is 15 mg 2 times a day.  7.  Hydralazine 25 mg 1 tablet 3 times a day.  8.  Diltiazem CD 180 mg 1 capsule once a day.  9.   Demeclocycline 300 mg 1 tablet every 12 hours.  10.  Apixaban 5 mg b.i.d.  11.  Cefuroxime 250 p.o. b.i.d.  12.  Acetaminophen 325 mg 2 tablets every 6 hours.   REVIEW OF SYSTEMS:  CONSTITUTIONAL: No fever. Positive for fatigue, weakness.  EYES: No blurred or double vision.  EARS, NOSE, AND THROAT: No tinnitus, ear pain, hearing loss.  RESPIRATORY: No cough, wheeze, hemoptysis.  CARDIOVASCULAR: Denies chest pain, palpitations, or edema.  GASTROINTESTINAL: Denies nausea, vomiting, diarrhea, Positive for abdominal pain and constipation.  GENITOURINARY: No dysuria, hematuria, frequency.  ENDOCRINE: No nocturia, thyroid problems, or other heat or cold intolerance.  HEMATOLOGY: Denies easy bruising, bleeding.  SKIN: No rash or lesions.  MUSCULOSKELETAL: Positive for chronic back pain and arthritis. No swelling or gout.  NEUROLOGIC: No CVA, TIA, tremors, or paresthesias. PSYCHIATRIC: Denies any anxiety or depressive symptoms. All other systems reviewed and negative.   PHYSICAL EXAM:  GENERAL:  The patient is awake, alert, oriented x 3, not in acute distress.  VITAL SIGNS: Afebrile. Pulse is 76, blood pressure is 132/69, saturations are 94% on room air.  HEENT: Atraumatic, normocephalic. PERRLA, EOM intact.  Oral mucosa is moist.  NECK: Supple. No JVD. No carotid bruit.  LUNGS: Clear to auscultation bilaterally. No rales, rhonchi, respiratory distress, or labored breathing.  CARDIOVASCULAR: Both the heart sounds are normal. Rate, rhythm is  regular. PMI not lateralized. Chest nontender.  EXTREMITIES: Good pedal pulses, good femoral pulses. No lower extremity edema. The patient has some bruises present over the left third toe.  NEUROLOGIC: Grossly intact cranial nerves II through XII. No motor or sensory deficit.  PSYCHIATRIC: The patient is awake, alert, oriented x 3.  SKIN: Warm and dry.  ABDOMEN: Soft, benign, nontender. No organomegaly. Positive bowel sounds.   LABORATORY DATA:  Will  order bmp and CBC in the morning.   X-ray, KUB pending.   ASSESSMENT: An 81 year old [name], who has multiple medical problems who comes in with:  1.  Acute on chronic hyponatremia. The patient has been having on and off issues with hyponatremia. Etiology remains unclear. Not sure if this is syndrome of inappropriate antidiuretic hormone (secretion) or some other reason. Nephrology to follow. We will start the patient on intravenous normal saline at 85 mL an hour. Follow up i BMPn the morning. This was discussed with Dr. Wynelle LinkKolluru. Hyponatremia workup  has been done in the remote past as well.  2.  History of chronic atrial fibrillation. Continue apixaban for anticoagulation and her rate blocking agents which is metoprolol and Cardizem.  3.  Hypothyroidism. Continue Synthroid.  4.  Gastroesophageal reflux disease. Continue proton pump inhibitor. 5.  Hypertension. Continue olmesartan.  6.  History of chronic adrenal insufficiency. We will continue her p.o. steroids 15 mg b.i.d.  7.  Deep venous thrombosis prophylaxis. The patient is already on apixaban.  8.  Care management for discharge planning.   Above was discussed with the patient and the patient's family.   TIME SPENT: 50 minutes.   ____________________________ Wylie HailSona A. Allena KatzPatel, MD sap:LT D: 08/18/2014 18:52:37 ET T: 08/18/2014 20:05:32 ET JOB#: [id number]  cc: Tykeisha Peer A. Allena KatzPatel, MD, <Dictator> Willow OraSONA A Jayliani Wanner MD ELECTRONICALLY SIGNED 09/22/2014 9:16

## 2015-02-13 NOTE — Consult Note (Signed)
Chief Complaint:  Subjective/Chief Complaint More alert today with improved nausea also. Denies cp. Still feels weak.   VITAL SIGNS/ANCILLARY NOTES: **Vital Signs.:   06-Sep-15 16:00  Vital Signs Type Routine  Pulse Pulse 91  Respirations Respirations 19  Systolic BP Systolic BP 341  Diastolic BP (mmHg) Diastolic BP (mmHg) 74  Mean BP 93  Pulse Ox % Pulse Ox % 98  Pulse Ox Activity Level  At rest  Oxygen Delivery 2L  *Intake and Output.:   Shift 06-Sep-15 15:00  Grand Totals Intake:  132.8 Output:  450    Net:  -317.2 24 Hr.:  -317.2  IV (Primary)      In:  132.8  Urine ml     Out:  450  Length of Stay Totals Intake:  5584.8 Output:  2725    Net:  2859.8   Brief Assessment:  GEN well developed, well nourished, no acute distress, obese   Cardiac Irregular  murmur present  --Gallop   Respiratory normal resp effort  clear BS   Gastrointestinal details normal Soft  Bowel sounds normal   EXTR negative cyanosis/clubbing   Lab Results: Routine Chem:  06-Sep-15 00:52   Sodium, Serum  130 (Result(s) reported on 28 Jun 2014 at 01:11AM.)    05:49   Creatinine (comp) 0.60  eGFR (African American) >60  eGFR (Non-African American) >60 (eGFR values <40mL/min/1.73 m2 may be an indication of chronic kidney disease (CKD). Calculated eGFR is useful in patients with stable renal function. The eGFR calculation will not be reliable in acutely ill patients when serum creatinine is changing rapidly. It is not useful in  patients on dialysis. The eGFR calculation may not be applicable to patients at the low and high extremes of body sizes, pregnant women, and vegetarians.)  Sodium, Serum  127  Glucose, Serum 98  BUN 7  Potassium, Serum 3.6  Chloride, Serum  95  CO2, Serum 26  Calcium (Total), Serum 8.5  Anion Gap  6  Osmolality (calc) 253    11:54   Sodium, Serum  129 (Result(s) reported on 28 Jun 2014 at 12:18PM.)    14:52   Sodium, Serum  128 (Result(s) reported on 28 Jun 2014 at 03:10PM.)    19:27   Sodium, Serum  134 (Result(s) reported on 28 Jun 2014 at 07:50PM.)    23:18   Sodium, Serum  135 (Result(s) reported on 29 Jun 2014 at 12:37AM.)  Misc Urine Chem:  06-Sep-15 01:20   Osmolality, Urine Random 397 (50-1400 300-900 mOsm/kg   with avg Fluid Intake > 850 mOsm/kg  with Fluid Restriction)  Sodium, Urine Random 106 (Result(s) reported on 28 Jun 2014 at 02:53AM.)   Radiology Results: XRay:    02-Sep-15 12:21, Chest Portable Single View  Chest Portable Single View   REASON FOR EXAM:    shortness of breath, tachycardia  COMMENTS:       PROCEDURE: DXR - DXR PORTABLE CHEST SINGLE VIEW  - Jun 24 2014 12:21PM     CLINICAL DATA:  Chest pain and tachycardia.  Shortness of breath    EXAM:  PORTABLE CHEST - 1 VIEW    COMPARISON:  05/16/2014    FINDINGS:  No cardiomegaly when considering technique. Upper mediastinal  contours are stable from previous. Low lung volumes with  interstitial crowding and minimal atelectasis at the right base. No  interstitial edema, definitive pneumonia, effusion, or pneumothorax.     IMPRESSION:  Low lung volumes without edema or pneumonia.  Electronically Signed    By: Jorje Guild M.D.    On: 06/24/2014 12:43         Verified By: Gilford Silvius, M.D.,  Cardiology:    02-Sep-15 10:38, ED ECG  Ventricular Rate 134  Atrial Rate 133  QRS Duration 72  QT 250  QTc 373  R Axis 26  T Axis 58  ECG interpretation   Atrial fibrillation with rapid ventricular response  Abnormal ECG  When compared with ECG of 29-Mar-2014 09:46,  Atrial fibrillation has replaced Sinus rhythm  Vent. rate has increased BY  64 BPM  ----------unconfirmed----------  Confirmed by OVERREAD, NOT ([id number]), editor PEARSON, BARBARA ([id number]) on 06/25/2014 10:36:08 AM  ED ECG    Assessment/Plan:  Invasive Device Daily Assessment of Necessity:  Does the patient currently have any of the following indwelling devices? foley    Assessment/Plan:  Assessment IMP AFIB Hypo Na AMS Abd discomfort Nausea Weakness .   Plan PLAN Rate control with metoprolol and dilt Rhythm control with amiodarone Current Na agree with anti nausea therapy DVT PROPHYLAXIS Physical therapy I do not rec cardioversion at this point Consider transfer to tele soon   Electronic Signatures: Yolonda Kida (MD)  (Signed 07-Sep-15 07:36)  Authored: Chief Complaint, VITAL SIGNS/ANCILLARY NOTES, Brief Assessment, Lab Results, Radiology Results, Assessment/Plan   Last Updated: 07-Sep-15 07:36 by Lujean Amel D (MD)

## 2015-02-13 NOTE — H&P (Signed)
PATIENT NAME:  [name], [name] MR#:  [id number] DATE OF BIRTH:  November 24, 1928  DATE OF ADMISSION:  10/30/2013  This 81 year old female presents with intractable nausea, vomiting, severe left lower quadrant abdominal pain. She has been sick for about 10 days, the last 7 of which she has not had any solid foods, only a little bit of liquids, and not even any liquids at all in the last 48 hours. She is profoundly lethargic, can barely walk in, almost fainted in the office, had to lie down with profound orthostasis. She notes progressive abdominal pain. Her bowels have not moved in the last few days, not really again taking anything p.o. Some chills last night. No fever per se. With these symptoms, she will be admitted for further evaluation and treatment.   PAST MEDICAL HISTORY: MEDICAL ILLNESSES: Hypothyroidism, allergic rhinitis, hypertension, lumbar disk disease, restless leg syndrome, history of diverticulitis.   PAST SURGICAL HISTORY: Carpal tunnel, heart catheterization, cholecystectomy, LS laminectomy, left total knee, L3-L4 laminectomy in 2006.   SOCIAL HISTORY: Widowed, several kids. Retired. No smoking. No alcohol.   FAMILY HISTORY: Noncontributory.   REVIEW OF SYSTEMS: As above, otherwise negative.   MEDICATIONS: Synthroid 112 mcg daily, hydralazine 75 mg t.i.[name]., Protonix 40 mg b.i.[name]., Benicar 40 mg daily, Align 1 daily, Toprol-XL 50 mg b.i.[name]., Coreg CR 20 mg at bedtime, Norvasc 10 mg 1/2 tab daily, Claritin RediTab daily, hydrocortisone 10 mg 2 tabs daily, Carafate 1 gram b.i.[name].   ALLERGIES: CODEINE, DIURETICS, MACRODANTIN, PENICILLIN, VALIUM, ZIAC.   PHYSICAL EXAMINATION: VITAL SIGNS: Blood pressure is 152/72, pulse 76 on lying and 100/60 with pulse 100 on standing with near-syncope.  NECK: No JVD.  LUNGS: Clear.  HEART: Regular rhythm, mild tachycardia.  ABDOMEN: Bowel sounds present, soft, moderate left lower quadrant tenderness. No rebound.  EXTREMITIES: No edema.  SKIN: No rash.   NEUROLOGIC: Grossly lethargic, otherwise nonfocal. Very generally weak.   LABORATORY AND RADIOLOGICAL DATA: Sodium 126. CT pending.   ASSESSMENT AND PLAN: 1.  Left lower quadrant pain: Appears not to be a surgical issue, likely severe diverticulitis. IV Levaquin and IV Flagyl.  2.  Dehydration: Normal saline.  3.  Hyponatremia: Will give Solu-Medrol x 1 with adrenal axis insufficiency. Continue cortisone. Administer normal saline. Follow closely. This has been a problem for some time, but certainly an exacerbation with her dehydration.  4.  Hypertension: Continue medications.  5.  Weakness: Has a lot of recent sciatica. Physical therapy planned.  6.  Tachycardia: Continue with Toprol-XL. Overall prognosis is guarded.    ____________________________ Danella PentonMark F. Braidon Chermak, MD mfm:jcm [name]: 10/30/2013 17:56:54 ET T: 10/30/2013 18:11:46 ET JOB#: [id number]  cc: Danella PentonMark F. Cleota Pellerito, MD, <Dictator> Danella PentonMARK F Orla Estrin MD ELECTRONICALLY SIGNED 10/31/2013 8:12

## 2015-02-13 NOTE — Consult Note (Signed)
PATIENT NAME:  [name], OSUCH MR#:  [id number] DATE OF BIRTH:  09/02/1929  DATE OF CONSULTATION:  05/11/2014  REFERRING PHYSICIAN:  Rusty Aus, MD CONSULTING PHYSICIAN:  A. Lavone Orn, MD  CHIEF COMPLAINT: Hyponatremia.   HISTORY OF PRESENT ILLNESS: This is an 81 year old female with a history of chronic hyponatremia who was admitted earlier today with nausea, vomiting, and severe hyponatremia with a sodium of 117. She had been hospitalized for diverticulitis and sent from the hospital to a rehabilitation facility last month. She returned home from rehabilitation on July 8th and apparently has had a gradual decline since that time. She has been having nausea for at least the last 10 days including while staying at the rehabilitation facility. At home, she has not been able to tolerate many foods at all. She has vomited up her liquid supplements. She has had nausea that has been persistent throughout the day. She is really not tolerating much liquids. Other complaints include low back pain which is chronic but worsened recently. She denies any abdominal pain, denies any fevers.   Again, the patient has a history of chronic hyponatremia which has been attributed to adrenal insufficiency. She has been treated periodically with high-dose steroids in the past. She has iatrogenic adrenal insufficiency. She takes hydrocortisone 10 mg b.i.d. for this condition. She denies having missed any tablets of her hydrocortisone. She denies any recent exposure to high-dose steroids. According to her aide who stays with her 3 days per week, a serum sodium level prior to discharge from rehabilitation was 134. An outpatient chemistry panel 3 days ago on July 17 showed a serum sodium of 122. She has not had hyperkalemia.   PAST MEDICAL HISTORY:  1.  Iatrogenic adrenal insufficiency.  2.  Chronic hyponatremia.  3.  Postablative hypothyroidism.  4.  Hypertension.  5.  Allergic rhinitis.  6.  Osteoarthritis.  7.   History of TIA.  8.  Diverticulosis with history of recurrent diverticulitis.  9.  Sleep apnea.  10.  History of restless legs syndrome.  11.  History of DVT.   PAST SURGICAL HISTORY:  1.  Carpal tunnel repair.  2.  Cholecystectomy.  3.  LF laminectomy, 2006, and again in 2007.  4.  Left total knee replacement.   ALLERGIES: CODEINE, FUROSEMIDE, MACRODANTIN, PENICILLIN, SULFA, VALIUM, ZIAC.   CURRENT MEDICATIONS:  1.  Hydrocortisone 10 mg b.i.d.  2.  Amlodipine 5 mg b.i.d.  3.  Coreg CR 20 mg daily.  4.  Vitamin D 2000 units daily.  5.  Docusate 100 mg b.i.d.  6.  Hydralazine 75 mg t.i.d.  7.  Levothyroxine 125 mcg daily.  8.  Benicar 40 mg daily.  9.  Protonix 40 mg b.i.d.  10.  Carafate 1 g t.i.d.  11.  Normal saline 0.9% at 100 mL/h.   SOCIAL HISTORY: The patient lives alone. She was recently discharged from [facility] for rehabilitation. No tobacco use. No alcohol use.   FAMILY HISTORY: No known endocrine disorders. One uncle with colon cancer.  REVIEW OF SYSTEMS:  GENERAL: Suspects weight loss, not sure how much. No fevers.  HEENT: Denies blurred vision. Denies sore throat.  NECK: Denies neck pain or dysphagia.  CARDIAC: Denies chest pain or palpitation.  PULMONARY: Denies cough or shortness of breath.  MUSCULOSKELETAL: Reports low back pain.  ABDOMEN: Has nausea, poor p.o. intake.  ENDOCRINE: Denies heat or cold intolerance.  GENITOURINARY: Denies dysuria or hematuria.  HEMATOLOGIC: Denies easy bruisability or recent bleeding.  PHYSICAL EXAMINATION:  VITAL SIGNS: Height 63.7 inches, weight 151 pounds, temperature 98.0, pulse 100, respirations 20, blood pressure 129/68, pulse oximetry 94%.  GENERAL: Elderly white female in no acute distress.  HEENT: EOMI. Oropharynx is clear. Mucous membranes moist.  NECK: Supple. No appreciable thyromegaly.  CARDIAC: Tachycardia. No appreciable murmur.  PULMONARY: Clear to auscultation bilaterally.  ABDOMEN: Mild diffuse  nonfocal tenderness. Positive bowel sounds.  EXTREMITIES: No edema is present.  MUSCULOSKELETAL: Lower extremities are very thin, with evidence of muscle atrophy.  SKIN: No rash or dermatopathy noted.  NEUROLOGIC: Alert, oriented.  PSYCHIATRIC: Calm, cooperative.   LABORATORY DATA: Glucose 112, creatinine 0.67, sodium 117, potassium 4.4, chloride 82, eGFR greater than 60, calcium 9.1, albumin 3.3, AST 24, ALT 24, free thyroxine 1.69, hematocrit 36.4%, platelets 423,000, WBC 10.2. A urinalysis was negative.   ASSESSMENT: An 81 year old female with chronic hyponatremia, now acutely worsened, and history of iatrogenic adrenal insufficiency. I suspect that her hyponatremia is acutely worsened secondary to dehydration from nausea, vomiting. While nausea, vomiting may be due in part to hyponatremia, it seems these symptoms preceded her severe worsening of hyponatremia and there may be another underlying cause for the nausea.   RECOMMENDATIONS:  1.  Agree with hydration with 0.9% normal saline as you are doing.  2.  She has been given a dose of IV Solu-Medrol this morning. Will double her outpatient dose of hydrocortisone by giving hydrocortisone 20 mg b.i.d. for a 3-day period and then, should her clinical condition have improved, then we will resume her prior outpatient hydrocortisone as tolerated.  3.  Aggressive control of nausea and vomiting with further workup, if this seems indicated, to allow better hydration and oral nutrition.   Thank you for the kind request for consultation. I will follow along with you.    ____________________________ A. Lavone Orn, MD ams:sk D: 05/11/2014 21:32:56 ET T: 05/12/2014 00:49:06 ET JOB#: [id number]  cc: A. Lavone Orn, MD, <Dictator> Sherlon Handing MD ELECTRONICALLY SIGNED 05/21/2014 14:10

## 2015-02-13 NOTE — Discharge Summary (Signed)
PATIENT NAME:  [name], [name] MR#:  [id number] DATE OF BIRTH:  Apr 17, 1929  DATE OF ADMISSION:  10/30/2013 DATE OF DISCHARGE: [id number]   DISCHARGE DIAGNOSES:  1.  Intractable nausea and vomiting secondary to symptomatic hyponatremia.  2.  Acute diverticulitis.  3.  Lumbar disk disease with progressive sciatica.  4.  Hypothyroidism.  5.  Hypertension.  6.  Restless leg syndrome.   DISCHARGE MEDICATIONS: Synthroid 137 mcg daily, hydralazine 75 mg t.i.[name]., Protonix 40 mg b.i.[name]., Benicar 40 mg daily, ? 1 daily, Toprol-XL 150 mg b.i.[name]., Coreg CR 20 mg at bedtime, Norvasc 10 mg half tablet daily, Claritin RediTabs 10 mg daily, hydrocortisone 10 mg b.i.[name]., Carafate 1 gram b.i.[name]., Vitamin D3 2000 units daily, Colace 100 mg b.i.[name]., MiraLax 17 grams daily p.r.n. and Levaquin 500 mg daily x 5 days.   REASON FOR ADMISSION: This 81 year old female presents with nausea and left lower quadrant abdominal pain. Please see H and P for HPI, past medical history and physical exam.   HOSPITAL COURSE: The patient was admitted. CT of the abdomen showed diverticulitis in the descending colon. Sodium 126. She received Solu-Medrol for not only her back but because she has a low adrenal axis creating her low sodium. Normal saline was given as well as well as IV Flagyl and IV Cipro. Her sodium came up to 132 and was stable there. Her appetite improved with the IV antibiotics. Her abdominal pain improved. She remained fairly constipated and finally moved with milk of magnesia. Her back is a significant issue with chronic pain from severe degenerative disk disease, postoperative x 2 in the past. She takes Norco p.r.n. for her back. Overall, with her back and her current illness, she is very weak and will need skilled nursing PT and OT. Overall prognosis is good.  ____________________________ Danella PentonMark F. Miller, MD mfm:aw [name]: 11/05/2013 07:46:45 ET T: 11/05/2013 07:51:47 ET JOB#: [id number]  cc: Danella PentonMark F. Miller, MD, <Dictator> MARK Sherlene ShamsF  MILLER MD ELECTRONICALLY SIGNED 11/06/2013 8:19

## 2015-02-13 NOTE — Consult Note (Signed)
Brief Consult Note: Diagnosis: T9,11, L1 compression fractures.   Patient was seen by consultant.   Recommend to proceed with surgery or procedure.   Comments: will need kyphoplasty with her severe pain, has worked with Dr Yves Dillhasnis for pain control but it is uncontrollable.  She is not ambulatory because of these fractures, on CT and prior MRI. Needs to be off Eliquis for surgery 11/30.  Electronic Signatures for Addendum Section:  [name], Adriona Kaney J (MD) (Signed Addendum [id number]-Nov-15 12:18)  brochure and bone model reviewed with patient.  Will answer any questions morning of procedure.   Electronic Signatures: [name], Toyna Erisman J (MD)  (Signed [phone]-Nov-15 07:55)  Authored: Brief Consult Note   Last Updated: 25-Nov-15 12:18 by [name], Annaelle Kasel J (MD)

## 2015-02-13 NOTE — Consult Note (Signed)
PATIENT NAME:  [name], [name] MR#:  [id number] DATE OF BIRTH:  1929-04-18  DATE OF CONSULTATION:  09/17/2014  REFERRING PHYSICIAN:   CONSULTING PHYSICIAN:  Scot Junobert T. Dai Apel, MD  HISTORY OF PRESENT ILLNESS:  The patient is an 81 year old white female well known to me who comes in the hospital with a history of abdominal pain for the past week. She also had urinary frequency. She was treated with Levaquin for 6 days and she still had abdominal pain and fever, chills, nausea, and vomiting, so she was seen in the ER and admitted to the hospital.   The patient also has a problem that she is unable to walk. She has had multiple lumbar laminectomies and now has a collapsed vertebra and this may be addressed early next week with kyphoplasty with Dr. Rosita KeaMenz. I was asked to see her because of her abdominal pain, nausea, and vomiting.   REVIEW OF SYSTEMS:  She denies dysuria. No hematuria. No rectal bleeding. No headaches. No diarrhea. No bowel movement in the last 3 days. She has had coughing for a week, mostly a dry cough. She does have pain in the suprapubic periumbilical area worse with coughing. She has no known chronic lung disease. She did have some vomiting this morning of mostly phlegm and she has overall not been feeling well.   PAST MEDICAL HISTORY: Hypertension, osteoarthritis, hypothyroidism, chronic adrenal insufficiency on chronic steroids, history of chronic atrial fibrillation on Eliquis, history of hyponatremia, chronic back pain, history of diverticular disease, diverticulosis, and diverticulitis.   PAST SURGICAL HISTORY: A left knee replacement, lumbar laminectomy x 4, laparotomy, carpal tunnel surgery, and cholecystectomy. Her last back surgery was 2007.   SOCIAL HISTORY: She is a widow and lives [facility].  Husband was retired [employer].   HABITS: Does not smoke, does not drink.   FAMILY HISTORY: Positive for colon cancer.   ALLERGIES: CODEINE, LASIX, MACRODANTIN,  PENICILLIN, SULFA, VALIUM, ZIAC, ADHESIVE, AND STRAWBERRY.   MEDICATIONS: Listed in the admission H and P.    PHYSICAL EXAMINATION:  GENERAL: Elderly white female with a frequent dry cough.  VITAL SIGNS: Temperature 98.6, pulse 65, respirations 20, blood pressure 150/72, 92% saturation on room air.  HEENT: Sclerae nonicteric. Conjunctivae negative. Tongue is negative. Head is atraumatic. Trachea is in the midline.  CHEST:  Inspiratory crackles in the right base, she is lying on her right side and this is a dependent position. No egophony.  HEART: No murmurs or gallops I could hear.  ABDOMEN: Soft. There is some mild focal tenderness in the left mid lower area. No rebound, no guarding. Bowel sounds are present and active.  EXTREMITIES: No edema.  NEUROLOGIC: The patient is awake, alert, oriented.   LABORATORY DATA: On admission showed nitrite positive, RBCs of 30, white cells less than 1. X-rays show CT angiogram of the abdomen and pelvis shows mild atherosclerotic vascular disease without aneurysmal dilatation or significant stenosis. There is a segment of mild circumferential wall thickening in the transverse colon, this likely is focal peristalsis because this was not present on a recent CAT scan, mild bilateral pulmonary nodules, there are compression fractures noted, scattered colonic diverticula without active inflammation.   ASSESSMENT:  I am not sure what is causing her nausea and vomiting. Her abdominal exam is pretty unremarkable at this time and I do not palpate any masses. She did have significant urinary tract infection with still microscopic hematuria on admission. She does have significant back disease and is quite  possible that her abdominal pain is radiating from her spine.   I see no indication for any endoscopic procedures at this time. I recommend continued observation.   Her Eliquis will need to be held for a few days before her kyphoplasty. GI will follow with you, but no  acute intervention at this time.      ____________________________ Scot Jun, MD rte:bu [name]: 09/17/2014 13:03:51 ET T: 09/17/2014 13:22:50 ET JOB#: [id number]  cc: Scot Jun, MD, <Dictator> Danella Penton, MD Scot Jun MD ELECTRONICALLY SIGNED 09/22/2014 14:03

## 2015-02-13 NOTE — Discharge Summary (Signed)
PATIENT NAME:  [name], [name] MR#:  [id number] DATE OF BIRTH:  April 14, 1929  DATE OF ADMISSION:  08/02/2014 DATE OF DISCHARGE:  08/03/2014  DISCHARGE DIAGNOSES: 1.  Malignant hypertension.  2.  Bradycardia. 3.  Paroxysmal atrial fibrillation.  4.  Hypothyroidism.  5.  Lumbar spinal stenosis.  6.  Hyponatremia, syndrome of inappropriate antidiuretic hormone.   DISCHARGE MEDICATIONS: 1.  Synthroid 125 mcg daily.  2.  Hydrocortisone 15 mg b.i.d.  3.  Benicar 20 mg daily. 4.  Amiodarone 200 mg daily. 5.  Eliquis 5 mg b.i.d.  6.  Mirapex 1 mg at bedtime. 7.  Metoprolol tartrate 50 mg q. 6. 8.  Diltiazem ER 180 mg daily. 9.  Cefuroxime 250 mg b.i.d.  10.  Senna 1 b.i.d.  11.  Potassium chloride 8 mEq daily. 12.  Pantoprazole 40 mg b.i.d. 13.  Demeclocycline 300 mg b.i.d.  14.  Vitamin D 2000 units daily. 15.  Hydralazine 25 mg t.i.d.   REASON FOR ADMISSION: An 81 year old female who presents with malignant hypertension. Please see H and P for HPI, past medical history and physical exam.   HOSPITAL COURSE: The patient was admitted. Brain CT, blood work and chest x-ray all normal. She was given IV hydralazine and her blood pressure came down. She mistakenly took 4 metoprolol at one time, several times a day, resulting in bradycardia. She has been on hydralazine in the past, which has worked well. That was re-added and she can titrate that up at home. Her afib/heart rate is controlled with amiodarone and diltiazem. Overall prognosis is guarded. Her sodium is stable at 136.   ____________________________ Danella PentonMark F. Angelly Spearing, MD mfm:sb D: 08/03/2014 08:15:49 ET T: 08/03/2014 09:33:08 ET JOB#: [id number]  cc: Danella PentonMark F. Nhan Qualley, MD, <Dictator> Ikeisha Blumberg Sherlene ShamsF Kately Graffam MD ELECTRONICALLY SIGNED 08/04/2014 8:16

## 2015-02-13 NOTE — H&P (Signed)
PATIENT NAME:  [name], [name] MR#:  [id number] DATE OF BIRTH:  03-16-1929  DATE OF ADMISSION:  05/11/2014  An elderly female presents with symptomatic hyponatremia. She has had a history of hyponatremia with adrenal insufficiency. Her sodium has been relatively stable on Cortef 10 mg b.i.[name].; however, last week, she had progressive nausea and vomiting with some altered mental status and sodium was 122 at that time. She underwent volume restriction, but has worsened over the weekend with intractable nausea and vomiting, inability to walk around, falls and altered mental status. She will be admitted for further evaluation and treatment. Her blood pressure is overall stable. Her bowels have been stable, no diarrhea.   PAST MEDICAL HISTORY AND MEDICAL ILLNESSES:  Hypothyroidism, adrenal insufficiency with hyponatremia, lumbar disk disease, osteoarthritis, hypertension.   PAST SURGICAL HISTORY:  Cholecystectomy, carpal tunnel, LS laminectomy x 4, left total knee replacement.   ALLERGIES: CODEINE, MACRODANTIN, PENICILLIN, VALIUM.   DISCHARGE MEDICATIONS: Norvasc 5 mg b.i.[name]., Align 1 capsule daily, vitamin D3 of 2000 units daily, Coreg CR 20 mg at bedtime, Colace 100 mg b.i.[name]., hydralazine 25 mg 3 tabs t.i.[name].,  Norco 5/325, 1/2 tab q.6; Cortef 10 mg b.i.[name]., Synthroid 125 mcg daily, loratadine 10 mg daily, meclizine 25 mg t.i.[name]. p.r.n., Naprosyn 220 mg b.i.[name]. p.r.n., Benicar 40 mg daily, Zofran 4 mg q.6 p.r.n., pantoprazole 40 mg b.i.[name]., sucralfate 1 gram q.a.c. and at bedtime.   SOCIAL HISTORY:  Widowed. No smoking. No alcohol.   FAMILY HISTORY: Uncle with colon cancer.   REVIEW OF SYSTEM:  Otherwise negative and systems were reviewed.   PHYSICAL EXAMINATION:  VITAL SIGNS:  Blood pressure 130/58, pulse 85, afebrile.  NECK: No thyromegaly or bruits.  HEART:  Regular rhythm.  LUNGS: Clear to auscultation and percussion.  ABDOMEN: Good bowel sounds. Soft and nontender.  EXTREMITIES: No edema. Good  peripheral pulses.  NEUROLOGIC:  Generally weak, really not able to walk without significant assistance.   LABS:  Pending.   ASSESSMENT AND PLAN: 1.  Symptomatic hyponatremia - Solu-Medrol x 1. Check cortisol level, consult endocrine, normal saline.  2.  Hypertension - continue medications.  3. Severe osteoarthritis - has known severe lumbar disk disease, Norco, limit sedating medication, will need physical therapy and ultimately consider skilled nursing facility placement.    ____________________________ Danella PentonMark F. Tephanie Escorcia, MD mfm:dmm [name]: 05/11/2014 10:18:51 ET T: 05/11/2014 11:05:44 ET JOB#: [id number]  cc: Danella PentonMark F. Russell Quinney, MD, <Dictator> Rashawna Scoles Sherlene ShamsF Sady Monaco MD ELECTRONICALLY SIGNED 05/12/2014 8:13

## 2015-02-13 NOTE — H&P (Signed)
PATIENT NAME:  [name], [name] MR#:  [id number] DATE OF BIRTH:  July 13, 1929  DATE OF ADMISSION:  08/02/2014  REFERRING PHYSICIAN: [name], MD.  PRIMARY CARE PHYSICIAN: Bethann PunchesMark Miller, MD/[facility].   CHIEF COMPLAINT:  Elevated blood pressure.  HISTORY OF PRESENT ILLNESS:  An 81 year old Caucasian female with history of hypertension, hypothyroidism, paroxysmal atrial fibrillation, who presented with high blood pressure. States he "didn't feel well today." However  denies any headache, chest pain, shortness of breath or further symptomatology. Decided to check her blood pressure, noted to be markedly elevated with systolic blood pressure up into the 240s. She took Lopressor 50 mg 4 tablets twice today without any benefit to her blood pressure and thus presented to the hospital for further work-up and evaluation. Upon arrival to the ER, noted once again to be hypertensive 249/112, however her blood pressure has improved after receiving IV hydralazine x 2.   REVIEW OF SYSTEMS:  CONSTITUTIONAL: Denies any fevers, chills, fatigue.  EYES: Denies blurred vision, double vision or eye pain.  EARS, NOSE, THROAT: Denies tinnitus, ear pain, hearing loss.  RESPIRATORY: Denies cough, wheeze, or shortness of breath.  CARDIOVASCULAR: Denies chest pain, palpitations, edema.  GASTROINTESTINAL: Denies nausea, vomiting, diarrhea or abdominal pain.  GENITOURINARY: Denies dysuria, hematuria.  ENDOCRINE: Denies nocturia or thyroid problems.  HEMATOLOGY: Denies easy bruising, bleeding.  SKIN: Denies rashes or lesions.  MUSCULOSKELETAL: Denies pain in neck, back, shoulder, knees, hips or arthritic symptoms.  NEUROLOGIC: Denies paralysis, paresthesias.  PSYCHIATRIC: Denies anxiety or depressive symptoms.   Otherwise, full review of systems performed by me is negative.   PAST MEDICAL HISTORY: Adrenal insufficiency, hypertension, osteoarthritis, hypothyroidism, paroxysmal atrial fibrillation.   SOCIAL  HISTORY: Denies any alcohol, tobacco, or drug usage. Currently uses a walker for ambulation.   FAMILY HISTORY: Positive for colon cancer.   ALLERGIES: CODEINE, LASIX, MACRODANTIN, PENICILLIN, SULFA DRUGS, VALIUM, ZIAC, STRAWBERRIES AND ADHESIVE TAPE.   HOME MEDICATIONS: Include hydrocortisone 5 mg 3 tablets p.o. twice a day, Tylenol 650 mg p.o. 3 times daily, olmesartan 20 mg p.o. daily, amiodarone 200 mg p.o. daily, diltiazem 180 mg p.o. daily, Apixaban 5 mg p.o. twice a day, pramipexole 1 mg p.o. daily, metoprolol 50 mg p.o. q. 6 hours, MiraLax 17 grams daily as needed for constipation, Senna 1 tab p.o. twice a day as needed for constipation, potassium 8 mEq p.o. daily, pantoprazole 40 mg p.o. twice a day, levothyroxine 125 mcg p.o. daily, cholecalciferol 2000 international units p.o. daily.   PHYSICAL EXAMINATION: VITAL SIGNS: Temperature 97.8, heart rate 55, respirations 18, blood pressure on arrival 249/112, currently 171/58, saturating 98% on room air, weight 65.3 kg, BMI 24.7.  GENERAL: Well-nourished, well-developed, Caucasian female, currently in no acute distress.  HEAD: Normocephalic, atraumatic.  EYES: Pupils equal, round and reactive to light.  Extraocular muscles intact. No scleral icterus.  MOUTH: Moist mucosal membranes. Dentition intact. No abscess noted.  EARS, NOSE, THROAT: Clear without exudates. No external lesions.  NECK: Supple. No thyromegaly. No nodules. No JVD.  PULMONARY: Clear to auscultation bilaterally without wheezes, rubs or rhonchi. No use of accessory muscles. Good respiratory effort.  CHEST: Nontender to palpation.  CARDIOVASCULAR: S1, S2, bradycardic. No murmurs, rubs, or gallops. No edema. Pedal pulses 2+ bilaterally.  GASTROINTESTINAL: Soft, nontender, nondistended. No masses. Positive bowel sounds. No hepatosplenomegaly.  MUSCULOSKELETAL: No swelling, clubbing, or edema. Range of motion full in all extremities.  NEUROLOGIC: Cranial nerves II through XII  intact.  No gross focal neurologic deficits. Sensation intact. Reflexes intact.  SKIN: No ulceration, lesions, rash, cyanosis. Skin warm, dry. Turgor intact.   PSYCHIATRIC: Mood and affect within normal limits.  The patient is awake, alert, oriented x 3. Insight and judgment intact.   LABORATORY DATA: EKG performed revealing sinus bradycardia, however, no ST or T wave abnormalities. She has had a CT head performed revealing no acute intracranial finding as well as chest x-ray performed reveals no acute cardiopulmonary process. Remainder of laboratory data: Sodium 136, potassium 3.5, chloride 101, bicarbonate 29, BUN 14, creatinine 0.73, glucose 100. LFTs within normal limits. WBC 10, hemoglobin 11.2, platelets of 345,000.   ASSESSMENT AND PLAN: An 81 year old Caucasian female with history of hypertension, hypothyroidism, presenting with elevated blood pressure. 1.  Asymptomatic hypertensive urgency: Continue her home medications with holding parameters for heart rate given bradycardia and as needed hydralazine. Her blood pressure has improved in the Emergency Department. Will place on telemetry.  2.  Bradycardia secondary to medications: She is taken off doses of betablockers. She is currently asymptomatic. We will place on telemetry. Avoid any further AV nodal agents.  3. Paroxysmal atrial fibrillation, currently in sinus bradycardia:  Hold rate control agents as above. Continue with apixaban for anticoagulation.  4.  Hypothyroidism: Continue the Synthroid.  5.  Gastroesophageal reflux disease:  Continue proton pump inhibitor therapy.  6.  Venous thromboembolism prophylaxis:  Apixaban.  CODE STATUS: The patient is a full code.   TIME SPENT: 45 minutes.    ____________________________ Cletis Athens. Hower, MD dkh:hh [name]: 08/02/2014 23:19:58 ET T: 08/03/2014 01:54:44 ET JOB#: [id number]  cc: Cletis Athens. Hower, MD, <Dictator> [name] MD ELECTRONICALLY SIGNED 08/04/2014 0:44

## 2015-02-13 NOTE — Consult Note (Signed)
PATIENT NAME:  [name], [name] MR#:  [id number] DATE OF BIRTH:  03-05-1929  DATE OF CONSULTATION:  06/26/2014  REQUESTING PHYSICIAN:  Thurnell LoseVishwanathe Hande, MD    CONSULTING PHYSICIAN:  A. Wendall MolaMelissa Stefanee Mckell, MD   CHIEF COMPLAINT: Hyponatremia.   HISTORY OF PRESENT ILLNESS: This is an 81 year old female seen in consultation at the request of Dr. Marcello FennelHande for hyponatremia and adrenal insufficiency.  The patient is well-known to me.  She has a long history of iatrogenic adrenal insufficiency maintained for about the past year on hydrocortisone, also with chronic hyponatremia with a sodium level over the last 6 months in the 118 to 135 range.  More typical sodium is in the range of 124-129.  She is also on a 1.5 liter free water restriction in addition to following a high sodium diet.  [facility], where she has regular labs done.  Prior to admission, she had had a sodium level earlier this week of 118.  On admission, initial sodium level was 119. With IV fluids, it has improved to 127.  She has been maintained on hydrocortisone.  Additional recent outpatient lab work on 06/15/2014 included a sodium of 118, serum osmolality of 149, urine sodium of 31 and urine osmolality of 223. Earlier this week, the patient did establish care with Dr. Wynelle LinkKolluru, nephrology.  He was concerned that venlafaxine and Mirapex could be contributed to hyponatremia.  He recommended stopping both medications, which was done. She states that she did not take Mirapex on Monday night after it was stopped, however she had intolerable restless legs that night and did not sleep.   The patient also with chronic nausea.  This has been on-going for a month or more.  No discrete abdominal pain, other than with coughing.  She has not had much weight loss. Seems to be tolerating her diet at times.  States nausea is worse some days, more than others. She does have persistent nausea today.  Previously, it was thought that nausea may be due in part  to her narcotics for chronic back pain.  At last hospitalization, her narcotics were decreased.  She has also been given antiemetics without much benefit.   PAST MEDICAL HISTORY:  1.  Chronic back pain.  2.  Hypertension.  3.  Chronic hyponatremia.  4.  Iatrogenic adrenal insufficiency.  5.  Osteoarthritis.  6.  History of diverticulitis.   PAST SURGICAL HISTORY:  1.  Lumbar laminectomy x 4.  2.  Left total knee replacement.  3.  Carpal tunnel surgery.  4.  Cholecystectomy.   ALLERGIES: MACRODANTIN, CHOLINE, PENICILLIN, VALIUM   OUTPATIENT MEDICATIONS:  per H and P included: 1.   Hydrocortisone 20 mg daily.  2.  Hydrocodone 325/5 at 1 tab t.i.[name]. p.r.n.  3.  Amlodipine 5 mg b.i.[name].  4.  Benicar 40 mg daily.  5.  Coreg CR 10 mg daily.  6.  Hydralazine 50 mg t.i.[name].  7.  Levothyroxine 125 mcg daily.  8.  Pantoprazole 40 mg twice daily.  9.  Polyethylene glycol 17 grams daily as needed.  10. Vitamin [name] 2000 units daily.    SOCIAL HISTORY: The patient is a widow. [facility]. No smoking or alcohol.   FAMILY HISTORY: No significant known hyponatremia or other endocrine disorders.   REVIEW OF SYSTEMS:  GENERAL: No weight loss.  HEENT: No blurry vision. NECK:  No neck pain.  CARDIAC: No chest pain or palpitation.  PULMONARY: She does have a  dry cough. No shortness of breath.  ABDOMEN: Chronic nausea as stated above. Appetite is fair. Denies discrete abdominal pain.  GENITOURINARY: Denies dysuria or hematuria.  ENDOCRINE:  Denies heat or cold intolerance.  SKIN: Denies rash or recent skin changes.  MUSCULOSKELETAL: Denies lower extremity edema and denies focal myalgias. She does have chronic back pain.  HEMATOLOGIC: No easy bruisability or recent bleeding.   PHYSICAL EXAMINATION:  VITAL SIGNS: Temperature 97.7, pulse 130, respirations 27, blood pressure 98/80, 119/82, pulse oximetry 96% on room air.  GENERAL: This is a thin, elderly white female in no  acute distress.  HEENT: EOMI.  Oropharynx is clear. Mucous membranes moist.  NECK: Supple. No thyromegaly. CARDIAC: Irregularly irregular with tachycardia.  PULMONARY: Clear to auscultation bilaterally. No wheeze.  ABDOMEN: Soft, mild distention, nontender, decreased bowel sounds.  EXTREMITIES: Diffuse lower extremity muscle atrophy, chronic and unchanged.  SKIN: No rash or dermatopathy noted.  MUSCULOSKELETAL: No peripheral edema is present. Muscle strength appears stable.  NEUROLOGIC: Alert and oriented.  PSYCHIATRIC: Calm and cooperative  LABORATORY DATA: 06/26/2014 glucose 93, BUN 8, creatinine 0.54, sodium 126, potassium 4, chloride 93, bicarbonate 23, hematocrit 30.5%, WBC 7.7, platelets 314. On 06/25/2014, TSH 1.23.   ASSESSMENT:  1.  Chronic hyponatremia likely due to syndrome of inappropriate antidiuretic hormone.  2.  Iatrogenic adrenal insufficiency.  3.  Hypertension, uncontrolled.  4.  Hypothyroidism, controlled.  5.  Chronic back pain on long-standing narcotics.  6.  History of depression recently on venlafaxine which has been held.  7.  Chronic nausea and occasional emesis.   RECOMMENDATIONS:  1.  Serum sodium level is actually back to baseline at this time.  Recommend ongoing monitoring.  She may have had some underlying dehydration, as it improved with IV fluids.  2.  Adrenal insufficiency is likely over treated on hydrocortisone 20 mg per day. Now that her serum sodium is improved and she is hospitalized, I would like to try and decrease her hydrocortisone if possible. I will reduce her hydrocortisone to 20 mg in the morning and 15 mg in the afternoon for now.  3.  Hypothyroidism controlled on levothyroxine, continue this dose.  4.  Chronic nausea, unclear etiology. Narcotics may be playing a role. I question if there is an underlying gastrointestinal functional disorder or other gastrointestinal condition.  Agree with request for gastrointestinal consultation.   Thank  you for the kind request for consultation. I will follow along with you.     ____________________________ A. Wendall Mola, MD ams:DT [name]: 06/26/2014 13:28:18 ET T: 06/26/2014 13:49:44 ET JOB#: [id number]  cc: A. Wendall Mola, MD, <Dictator>  Macy Mis MD ELECTRONICALLY SIGNED 07/01/2014 17:41

## 2015-02-13 NOTE — Consult Note (Signed)
I will sign off, reconsult GI if needed, I will be out of town.  Electronic Signatures: Scot JunElliott, Yacine Garriga T (MD)  (Signed on 01-Dec-15 10:48)  Authored  Last Updated: 01-Dec-15 10:48 by Scot JunElliott, Laverne Hursey T (MD)

## 2015-02-13 NOTE — H&P (Signed)
PATIENT NAME:  [name], Milarose D MR#:  [id number] DATE OF BIRTH:  1929/01/08  DATE OF ADMISSION:  09/14/2014  PRIMARY CARE PHYSICIAN:  Danella PentonMark F. Miller, MD   REFERRING PHYSICIAN:  Randon Goldsmithebecca L. Lord, MD  CHIEF COMPLAINT:  Abdominal pain, 1 week.   HISTORY OF PRESENT ILLNESS:  An 81 year old Caucasian female with a history of hypertension and chronic adrenal insufficiency on chronic steroid, who presented to the ED with the above chief complaint. The patient is alert, awake, oriented, and in no acute distress. According to the patient and the patient's son, the patient has had abdominal pain for the past 1 week. In addition, per patient, the patient also has urinary frequency but denies any dysuria or hematuria. The patient was diagnosed with a urinary tract infection and has been treated with Levaquin for the past 6 days. The patient still has abdominal pain, and now, in addition, the patient has a fever , chills, and nausea with vomiting multiple times for 1 day, so the patient came to the ED for further evaluation. The patient is unable to walk and using a wheelchair. She denies any cough, sputum, or shortness of breath. Denies any melena or bloody stool.   PAST MEDICAL HISTORY:  Hypertension, osteoarthritis, hypothyroidism, chronic adrenal insufficiency on chronic steroid, history of chronic atrial fibrillation on Eliquis, history of hyponatremia, chronic back pain, history of diverticulitis.   PAST SURGICAL HISTORY:  Lumbar laminectomy 4 times, laparotomy, carpal tunnel surgery, cholecystectomy.   SOCIAL HISTORY:  The patient is a widow and lives in [facility]. No smoking, alcohol, or illicit drugs.   FAMILY HISTORY:  Colon cancer.   ALLERGIES:  CODEINE, LASIX, MACRODANTIN, PENICILLIN, SULFA DRUGS, VALIUM, ZIAC, STRAWBERRY, ADHESIVE.   HOME MEDICATIONS:  Benicar 40 mg p.o. daily, temeclocycline 300 mg p.o. b.i.d., Eliquis 5 mg p.o. b.i.d., hydralazine 25 mg 3 tablets t.i.d.,  hydrocortisone 10 mg 1.5 tablets b.i.d., Levaquin 500 mg p.o. every 4 hours, Lopressor 50 mg 2 tablets b.i.d., Mirapex 1 mg p.o. at bedtime, Norco 325 mg/5 mg p.o. once a day p.r.n. for pain, potassium 10 mEq p.o. daily, Protonix 40 mg p.o. b.i.d., Synthroid 125 mcg p.o. daily, tramadol 50 mg p.o. every 4 hours p.r.n., Tylenol 650 mg p.o. t.i.d., vitamin D3, 2000 international units 1 tablet once a day.   REVIEW OF SYSTEMS: CONSTITUTIONAL:  The patient has a fever and chills. No headache or dizziness, but has generalized weakness.  EYES:  No double vision or blurry vision.  EARS, NOSE, AND THROAT:  No postnasal drip, slurred speech, or dysphagia.  CARDIOVASCULAR:  No chest pain, palpitations, orthopnea, or nocturnal dyspnea. No leg edema.  PULMONARY:  No cough, sputum, shortness of breath, or hemoptysis.  GASTROINTESTINAL:  Positive for lower abdominal pain and nausea and vomiting. No diarrhea. No melena or bloody stool.  GENITOURINARY:  No dysuria, hematuria, or incontinence.  SKIN:  No rash or jaundice.  NEUROLOGIC:  No syncope, loss of consciousness, or seizure.  HEMATOLOGIC:  No easy bruising or bleeding.  ENDOCRINOLOGIC:  No polyuria, polydipsia, or heat or cold intolerance.   PHYSICAL EXAMINATION: VITAL SIGNS:  Temperature 98.2, blood pressure 126/61, pulse 74, oxygen saturation 97% on room air.  GENERAL:  The patient is alert, awake, oriented, and in no acute distress.  HEENT:  Pupils are round, equal, and reactive to light and accommodation. Moist oral mucosa. Clear oropharynx. The patient is hard of hearing.   NECK:  Supple. No JVD or carotid bruits. No lymphoadenopathy, No  thyromegaly.  CARDIOVASCULAR:  S1 and S2, regular rate and rhythm. No murmurs or gallops.  PULMONARY:  Bilateral air entry. No wheezing or rales. No use of accessory muscles to breathe.  ABDOMEN:  Soft. No distention, but has tenderness in the lower abdomen. No rigidity. No rebound. Bowel sounds present. No  organomegaly.  EXTREMITIES:  No edema, clubbing, or cyanosis. No calf tenderness. Bilateral pedal pulses present.  SKIN:  No rash or jaundice.  NEUROLOGY:  Alert and oriented x 3. No focal deficit. Power is 2 to 3 out of 5 in bilateral lower extremities. Sensation is intact.   LABORATORY AND RADIOLOGICAL DATA:  Urinalysis shows nitrite positive, WBCs less than 1, and RBCs of 30.   Abdominal 3-way including PA chest:  No acute abdominal findings.   Glucose is 114, BUN 21, creatinine 1.01, sodium 135, potassium 4.1, chloride 101, and bicarbonate is 25. WBC is 10.3, hemoglobin 11.1, and platelets are 368,000.   EKG showed normal sinus rhythm at 66 BPM.   IMPRESSIONS: 1.  Urinary tract infection with acute cystitis, failed outpatient treatment.  2.  Hypertension, controlled.  3.  Dehydration.  4.  Hyponatremia.  5.  History of atrial fibrillation, on Eliquis.   PLAN OF TREATMENT: 1.  The patient will be admitted to the medical floor for acute cystitis. The patient failed  outpatient treatment with p.o. Levaquin. We will discontinue Levaquin, start Rocephin, and follow up urine culture.  2.  For hypertension, continue the patient's home hypertension medication.  3.  For history of atrial fibrillation, we will continue Eliquis.   I discussed the patient's condition and plan of treatment with the patient and the patient's son.   CODE STATUS:  The patient wants full code.   TIME SPENT:  About 52 minutes.     ____________________________ Shaune Pollack, MD qc:nb D: 09/14/2014 22:20:57 ET T: 09/14/2014 22:56:16 ET JOB#: [id number]  cc: Shaune Pollack, MD, <Dictator> Shaune Pollack MD ELECTRONICALLY SIGNED 09/15/2014 17:05

## 2015-02-13 NOTE — Consult Note (Signed)
Chief Complaint:  Subjective/Chief Complaint Lethagic but arousable. Afib -RVR No pain   VITAL SIGNS/ANCILLARY NOTES: **Vital Signs.:   05-Sep-15 10:00  Vital Signs Type Routine  Pulse Pulse 116  Respirations Respirations 25  Systolic BP Systolic BP 335  Diastolic BP (mmHg) Diastolic BP (mmHg) 96  Mean BP 109  Pulse Ox % Pulse Ox % 96  Pulse Ox Activity Level  At rest  Oxygen Delivery Room Air/ 21 %    13:00  Respirations Respirations 23  *Intake and Output.:   05-Sep-15 11:00  Grand Totals Intake:  30 Output:      Net:  71 24 Hr.:  140  IV (Primary)      In:  30   Brief Assessment:  GEN no acute distress, obese, critically ill appearing   Cardiac Irregular  murmur present   Respiratory normal resp effort  rhonchi   Gastrointestinal Normal   Gastrointestinal details normal Soft  Nontender  Nondistended   EXTR negative cyanosis/clubbing, negative edema   Lab Results: Routine Chem:  05-Sep-15 06:39   Glucose, Serum  111  BUN 8  Creatinine (comp)  0.48  Sodium, Serum  119  Potassium, Serum 4.0  CO2, Serum 23  Calcium (Total), Serum 8.6  Anion Gap 8  Osmolality (calc) 239  eGFR (African American) >60  eGFR (Non-African American) >60 (eGFR values <67mL/min/1.73 m2 may be an indication of chronic kidney disease (CKD). Calculated eGFR is useful in patients with stable renal function. The eGFR calculation will not be reliable in acutely ill patients when serum creatinine is changing rapidly. It is not useful in  patients on dialysis. The eGFR calculation may not be applicable to patients at the low and high extremes of body sizes, pregnant women, and vegetarians.)  Result Comment SODIUM - RESULTS VERIFIED BY REPEAT TESTING.  - NOTIFIED OF CRITICAL VALUE  - C/CHARLIE FLEETWOOD AT 0710 06/27/14-DAS  - READ-BACK PROCESS PERFORMED.  Result(s) reported on 27 Jun 2014 at 07:05AM.  Result Comment OSMOLALITY - RESULTS VERIFIED BY REPEAT TESTING.  - NOTIFIED OF  CRITICAL VALUE  - C/CHARLIE FLEETWOOD AT 0757 06/27/14-DAS  - READ-BACK PROCESS PERFORMED.  Result(s) reported on 27 Jun 2014 at 08:03AM.  Uric Acid, Serum  2.1 (Result(s) reported on 27 Jun 2014 at 07:05AM.)  Osmolality, Blood  242    12:25   Sodium, Serum  121 (Result(s) reported on 27 Jun 2014 at 12:44PM.)    15:59   Sodium, Serum  126 (Result(s) reported on 27 Jun 2014 at 04:32PM.)    20:42   Sodium, Serum  127 (Result(s) reported on 27 Jun 2014 at 09:21PM.)   Radiology Results: XRay:    02-Sep-15 12:21, Chest Portable Single View  Chest Portable Single View   REASON FOR EXAM:    shortness of breath, tachycardia  COMMENTS:       PROCEDURE: DXR - DXR PORTABLE CHEST SINGLE VIEW  - Jun 24 2014 12:21PM     CLINICAL DATA:  Chest pain and tachycardia.  Shortness of breath    EXAM:  PORTABLE CHEST - 1 VIEW    COMPARISON:  05/16/2014    FINDINGS:  No cardiomegaly when considering technique. Upper mediastinal  contours are stable from previous. Low lung volumes with  interstitial crowding and minimal atelectasis at the right base. No  interstitial edema, definitive pneumonia, effusion, or pneumothorax.     IMPRESSION:  Low lung volumes without edema or pneumonia.      Electronically Signed    By:  Jorje Guild M.D.    On: 06/24/2014 12:43         Verified By: Gilford Silvius, M.D.,  Cardiology:    02-Sep-15 10:38, ED ECG  Ventricular Rate 134  Atrial Rate 133  QRS Duration 72  QT 250  QTc 373  R Axis 26  T Axis 58  ECG interpretation   Atrial fibrillation with rapid ventricular response  Abnormal ECG  When compared with ECG of 29-Mar-2014 09:46,  Atrial fibrillation has replaced Sinus rhythm  Vent. rate has increased BY  64 BPM  ----------unconfirmed----------  Confirmed by OVERREAD, NOT ([id number]), editor PEARSON, BARBARA ([id number]) on 06/25/2014 10:36:08 AM  ED ECG    Assessment/Plan:  Invasive Device Daily Assessment of Necessity:  Does the patient currently  have any of the following indwelling devices? foley   Indwelling Urinary Catheter continued, requirement due to   Reason to continue Indwelling Urinary Catheter for strict Intake/Output monitoring for hemodynamic instability   Assessment/Plan:  Assessment IMP AFIB-RVR HypoNa AMS Nausea Abd discomfort HTN .   Plan PLAN Rate control for AFIB add Digoxin Continue B-blockers/Cardiozem Consider Amiodarone for rhythm Short term anticoug AFIB/DVT Continue to current Na Agree with nausea therapy Bp control with metoprolol/Dilt Agree with fluid restriction   Electronic Signatures: Yolonda Kida (MD)  (Signed 06-Sep-15 07:57)  Authored: Chief Complaint, VITAL SIGNS/ANCILLARY NOTES, Brief Assessment, Lab Results, Radiology Results, Assessment/Plan   Last Updated: 06-Sep-15 07:57 by Lujean Amel D (MD)

## 2015-02-14 NOTE — Op Note (Signed)
PATIENT NAME:  [name], [name] MR#:  [id number] DATE OF BIRTH:  Mar 17, 1929  DATE OF PROCEDURE:  10/10/2011  PREOPERATIVE DIAGNOSIS: Visually significant cataract of the right eye.   POSTOPERATIVE DIAGNOSIS: Visually significant cataract of the right eye.   OPERATIVE PROCEDURE: Cataract extraction by phacoemulsification with implant of intraocular lens to right eye.   SURGEON: Galen ManilaWilliam Amal Saiki, MD.   ANESTHESIA:  1. Managed anesthesia care.  2. Topical tetracaine drops followed by 2% Xylocaine jelly applied in the preoperative holding area.   COMPLICATIONS: None.   TECHNIQUE:  Four quadrant divide-and-conquer.    DESCRIPTION OF PROCEDURE: The patient was examined and consented in the preoperative holding area where the aforementioned topical anesthesia was applied to the right eye.  The patient was brought back to the Operating Room where he was sat upright on the gurney and given a target to fixate upon while the eye was marked at the 3:00 and 9:00 position.  The patient was then reclined on the operating table, and lidocaine jelly was applied to the right eye.  The eye was prepped and draped in the usual sterile ophthalmic fashion and a lid speculum was placed. A paracentesis was created with the side port blade and the anterior chamber was filled with viscoelastic. A near clear corneal incision was performed with the steel keratome. A continuous curvilinear capsulorrhexis was performed with a cystotome followed by the capsulorrhexis forceps. Hydrodissection and hydrodelineation were carried out with BSS on a blunt cannula. The lens was removed in a four quadrant divide-and-conquer technique and the remaining cortical material was removed with the irrigation-aspiration handpiece. The eye was inflated with viscoelastic and the Alcon SN6AT3 22.5-diopter lens, serial number [id number]  was placed in the eye and rotated to within a few degrees of the predetermined orientation at 150 degrees.  The  remaining viscoelastic was removed from the eye.  The Sinskey hook was used to rotate the toric lens into its final resting place at 150 degrees.  The eye was inflated to a physiologic pressure and found to be watertight.  The eye was dressed with Vigamox and Omnipred. The patient was given protective glasses to wear throughout the day and a shield with which to sleep tonight. The patient was also given drops with which to begin a drop regimen today and will follow-up with me in one day.   ____________________________ Jerilee FieldWilliam L. Kelsea Mousel, MD wlp:cms [name]: 10/10/2011 11:46:10 ET T: 10/10/2011 11:52:51 ET JOB#: [id number]  cc: Madason Rauls L. Javier Mamone, MD, <Dictator>  Jerilee FieldWILLIAM L Camella Seim MD ELECTRONICALLY SIGNED 10/25/2011 12:34

## 2015-02-14 NOTE — Discharge Summary (Signed)
PATIENT NAME:  Laura LoraWOOD, Rusty D MR#:  [id number] DATE OF BIRTH:  1929/03/24  DATE OF ADMISSION:  01/25/2012 DATE OF DISCHARGE:  01/30/2012  DISCHARGE DIAGNOSES:  1. Symptomatic hyponatremia.  2. Hypertension, volatile.  3. Hypothyroidism.  4. Reactive airway disease.  5. Irritable bowel syndrome.  6. Osteoarthritis.   DISCHARGE MEDICATIONS:  1. Tylenol 650 mg every six hours p.r.n. pain.  2. Xanax 0.5 mg 1/2 tablet t.i.d.  3. Norvasc 10 mg daily.  4. Cymbalta 60 mg daily.  5. Hydralazine 75 mg t.i.d.  6. Synthroid 112 mcg daily.  7. Toprol-XL 50 mg b.i.d.  8. Protonix 40 mg b.i.d.  9. Potassium chloride 10 mEq daily.  10. Magnesium 400 mg daily.  11. Milk of magnesia 30 mL daily p.r.n. constipation.  12. Entocort EC 3 mg every other day.  13. Hydrocortisone 10 mg daily.  14. Benicar 40 mg daily.  15. Vitamin D 2000 units daily.   REASON FOR ADMISSION: 81 year old female presents with weakness and nausea. Please see history and physical for history of present illness, past medical history, physical exam.   HOSPITAL COURSE: Patient was admitted, hydrated with normal saline. Her sodium came up from 120 to 132. Her cortisone level was slightly low. She had had a questionable abnormal cotrisyn stem test in the past and hydrocortisone low dose 10 mg was added. Her sodium stabilized at 130 with fluid restriction off IV fluids. Her systolic pressure remained in the 140s to 150s off of Catapres. She is still pretty unstable on her feet and will be [facility]. She will remain off of Catapres at this point on the low-dose cortisone for further testing in the future by Dr. Tedd SiasSolum as to her need for long-term hydrocortisone therapy. Overall prognosis is guarded.  ____________________________ Danella PentonMark F. Miller, MD mfm:cms D: 01/30/2012 07:54:19 ET T: 01/30/2012 08:13:39 ET JOB#: [id number]  cc: Danella PentonMark F. Miller, MD, <Dictator> MARK Sherlene ShamsF MILLER MD ELECTRONICALLY SIGNED  02/02/2012 7:46

## 2015-02-14 NOTE — H&P (Signed)
PATIENT NAME:  [name], [name] MR#:  [id number] DATE OF BIRTH:  1929/05/22  DATE OF ADMISSION:  01/02/2012  HISTORY OF PRESENT ILLNESS: 81 year old female presents with altered mental status, general weakness, feels like she is going to pass out, some intermittent nonexertional chest pain. She was noted to have hypotension over the weekend after a diarrheal illness. Her blood pressure medicine was reduced. Her blood pressure has been swinging systolic anywhere from 115 to 562150. She has just felt very poorly to the point that she can hardly get out of bed. She is not really thinking straight.  She lives alone. She noted some chest discomfort off and on, but nothing consistent. No significant shortness of breath and really not able to go back home as she was very unstable in the office, could not really stand up, could not really think correctly, and thus will be admitted for further evaluation and treatment. Sodium of 131 is the likely etiology.   PAST MEDICAL HISTORY/ MEDICAL ILLNESSES:  1. Hypothyroidism.  2. Allergic rhinitis.  3. Hypertension. 4. Diverticulosis.  5. Irritable bowel syndrome.   PAST SURGICAL HISTORY:  1. Cholecystectomy.  2. Laminectomy. 3. Left total knee replacement.   ALLERGIES: Codeine, diuretics, Macrodantin, penicillin, Valium, Ziac.  MEDICATIONS:  1. Cymbalta 30 mg, two at bedtime.  2. Azor 10/40 daily.  3. Metoprolol succinate 50 mg t.i.[name].  4. Synthroid 100 mcg daily.  5. Align daily.  6. Entocort EC 3 mg daily. 7. Meclizine 25 mg p.r.n.  8. Vitamin [name] 2000 units daily.  9. Caltrate [name] daily.  10. Hydralazine 25 mg, 3 tabs t.i.[name].  11. Temazepam 30 mg at bedtime.  12. Clonidine 0.1 mg 2 tabs t.i.[name].  13. Dexilant 60 mg daily.  14. Xanax 0.25 mg t.i.[name].  15. Zofran 4 mg p.r.n.   SOCIAL HISTORY: Widowed. Lives alone. No smoking or alcohol.   FAMILY HISTORY: Significant for colon cancer and coronary artery disease.   REVIEW OF SYSTEMS: As above, otherwise  negative.   PHYSICAL EXAMINATION:  VITAL SIGNS: Blood pressure 112/58, afebrile, pulse 64.   NECK: No thyromegaly or bruits.   LUNGS: Clear.   HEART: Regular rhythm. No audible murmur.   ABDOMEN: Mild epigastric tenderness.   EXTREMITIES: No edema.   NEUROLOGIC: The patient is lethargic, does not really follow commands well, trouble getting sentences out, slumping forward. No clear focal motor or sensory deficits.   LABS: Sodium 131.   ASSESSMENT AND PLAN:  1. Altered mental status/weakness:  Likely hyponatremia from recent diarrheal illness. We will administer IV normal saline. Check thyroid status.  2. Hypertension: Very volatile pressures. We will back off on her medicine a little bit but basically continue that and follow closely.  3. Irritable bowel syndrome: Continue the reflux medication.  4. Intermittent chest pain: Cardiac enzymes pending.  5. Generalized weakness: Will need physical therapy.   OVERALL PROGNOSIS: Guarded   ____________________________ Danella PentonMark F. Miller, MD mfm:bjt [name]: 01/02/2012 18:49:05 ET T: 01/03/2012 05:57:07 ET JOB#: [id number]  cc: Danella PentonMark F. Miller, MD, <Dictator> MARK Sherlene ShamsF MILLER MD ELECTRONICALLY SIGNED 01/05/2012 7:58

## 2015-02-14 NOTE — Discharge Summary (Signed)
PATIENT NAME:  [name], [name] MR#:  [id number] DATE OF BIRTH:  Aug 14, 1929  DATE OF [name]:  01/04/2012 DATE OF DISCHARGE:  01/06/2012  DISCHARGE DIAGNOSES:  1. Dehydration with hyponatremia.  2. Chest pain syndrome, noncardiac.  3. Hypotension. 4. Volatile hypertension with hypertensive urgency.  5. Restless leg syndrome.  6. Severe osteoarthritis.   DISCHARGE MEDICATIONS:  1. Synthroid 100 mcg daily.  2. Cymbalta 60 mg daily.  3. Azor 10/40 mg daily.  4. Align 4 mg daily.  5. Restoril 15 mg at bedtime.  6. Metoprolol succinate 50 mg three times daily. 7. Entocort 3 mg every other day. 8. Dexilant 60 mg daily. 9. Caltrate C 600 mg daily.  10. Ondansetron 4 mg daily p.r.n.  11. Vitamin D3 400 units daily.  12. Alprazolam 0.25 mg twice a day.  13. Catapres-TTS #2 patch weekly.  14. Hydralazine 25 mg two tabs four times daily. 15. Levaquin 250 mg daily x5 days.   REASON FOR ADMISSION: This is an 81 year old female who presented with volatile blood pressures, dehydration, and hyponatremia with chest pain. Please see history and physical for history of present illness, past medical history, and physical exam.   HOSPITAL COURSE: The patient was admitted and ruled out for myocardial infarction. She had further chest pain and again ruled out for myocardial infarction. No sign for acute coronary syndrome. Her blood pressure was up and down, initially down and then went up. Medications were changed, ultimately Catapres patch added to try to minimize the seesawing of the blood pressure with the p.o. clonidine. She ran a low-grade temperature. Chest x-ray was negative, but she was empirically started on low-dose Levaquin. She had significant nausea from potassium replacement, but that resolved as potassium was discontinued. Constipation was treated with milk of magnesia with resolution. She was fairly weak and got physical therapy and is back more to her baseline state. Her overall prognosis is  guarded with her multiple problems. She will be on four glasses of fluid restriction and will followup with Dr. Hyacinth MeekerMiller in 3 or 4 days  ____________________________ Danella PentonMark F. Janilah Hojnacki, MD mfm:slb [name]: 01/06/2012 08:40:45 ET T: 01/06/2012 14:46:26 ET JOB#: [id number]  cc: Danella PentonMark F. Khloei Spiker, MD, <Dictator> Malakie Balis Sherlene ShamsF Marlisha Vanwyk MD ELECTRONICALLY SIGNED 01/07/2012 9:17

## 2015-02-14 NOTE — Op Note (Signed)
PATIENT NAME:  [name], [name] MR#:  [id number] DATE OF BIRTH:  [birth year]  DATE OF PROCEDURE:  11/28/2011  PREOPERATIVE DIAGNOSIS: Visually significant cataract of the left eye.   POSTOPERATIVE DIAGNOSIS: Visually significant cataract of the left eye.   OPERATIVE PROCEDURE: Cataract extraction by phacoemulsification with implant of Toric intraocular lens to left eye.   SURGEON: Galen ManilaWilliam Uzair Godley, MD.   ANESTHESIA:  1. Managed anesthesia care.  2. Topical tetracaine drops followed by 2% Xylocaine jelly applied in the preoperative holding area.   COMPLICATIONS: None.   TECHNIQUE:  Four quadrant divide-and-conquer     DESCRIPTION OF PROCEDURE: The patient was examined and consented in the preoperative holding area where the aforementioned topical anesthesia was applied to the left eye.  The patient was brought back to the Operating Room where he was sat upright on the gurney and given a target to fixate upon while the eye was marked at the 3:00 and 9:00 position.  The patient was then reclined on the operating table, and lidocaine jelly was applied to the left eye.  The eye was prepped and draped in the usual sterile ophthalmic fashion and a lid speculum was placed. A paracentesis was created with the side port blade and the anterior chamber was filled with viscoelastic. A near clear corneal incision was performed with the steel keratome. A continuous curvilinear capsulorrhexis was performed with a cystotome followed by the capsulorrhexis forceps. Hydrodissection and hydrodelineation were carried out with BSS on a blunt cannula. The lens was removed in a four quadrant divide-and-conquer technique and the remaining cortical material was removed with the irrigation-aspiration handpiece. The eye was inflated with viscoelastic and the Alcon SN6AT3 23.0-diopter lens, serial number [id number] was placed in the eye and rotated to within a few degrees of the predetermined orientation at 20 degrees.  The  remaining viscoelastic was removed from the eye.  The Sinskey hook was used to rotate the toric lens into its final resting place at 20 degrees.  The eye was inflated to a physiologic pressure and found to be watertight.  The eye was dressed with Vigamox and Omnipred. The patient was given protective glasses to wear throughout the day and a shield with which to sleep tonight. The patient was also given drops with which to begin a drop regimen today and will follow-up with me in one day.   ____________________________ Jerilee FieldWilliam L. Zhane Donlan, MD wlp:drc [name]: 11/28/2011 13:07:03 ET T: 11/28/2011 13:31:53 ET JOB#: [id number]  cc: Pete Merten L. Zennie Ayars, MD, <Dictator> Jerilee FieldWILLIAM L Yossef Gilkison MD ELECTRONICALLY SIGNED 12/05/2011 12:47

## 2015-02-14 NOTE — H&P (Signed)
PATIENT NAME:  [name], VORCE MR#:  [id number] DATE OF BIRTH:  [birth year]  DATE OF ADMISSION:  01/25/2012  CHIEF COMPLAINT: Generalized weakness with nausea.   HISTORY OF PRESENT ILLNESS: The patient is an 81 year old female with a history of recurrent hyponatremia as well as hypertension, depression, dyspepsia, irritable bowel syndrome. She has had fairly recent hospitalization for hyponatremia. She also has a history of labile blood pressure. They had adjusted blood pressure medications and had been trying to use salt to help support her sodium level. She had been noted to have worsening blood pressure issues. Her fluids have been liberalized just slightly, however, over the last several days she has complained of some nausea and belching particularly after she eats. She's not really had any vomiting but she feels like she has decreased her oral intake. She has felt progressively weak, having more trouble getting around the room. She had labs performed as an outpatient and was reported to have a sodium of 120 and was sent to the Emergency Room where she is found to have a sodium of 125. On exam she is burping after she drinks any fluids and does not feel that she is able to take care of herself at home. She is admitted now with symptomatic hyponatremia.   PAST MEDICAL HISTORY:  1. Hypothyroidism.  2. Allergic rhinitis.  3. Hypertension.  4. Degenerative joint disease.  5. Depression.  6. History of transient ischemic attacks.  7. Gastroesophageal reflux disease.  8. Irritable bowel syndrome.   9. Reactive airway disease.  10. Restless leg syndrome.  11. Remote history of DVT.  12. History of carpal tunnel syndrome surgery.  13. History of cholecystectomy.  14. History of multiple lumbar spine surgeries.  15. History of left total knee arthroplasty.   ALLERGIES: Codeine, diuretics, Macrodantin, penicillin, Valium, Ziac, strawberries, adhesive.   MEDICATIONS:  1. Cymbalta 60 mg p.o. daily.   2. Azor 10/40 mg 1 p.o. daily.  3. Toprol-XL 50 mg p.o. t.i.d.  4. Synthroid 0.1 mg p.o. daily.  5. Lime 1 p.o. daily. 6. Meclizine as needed.  7. Vitamin D 2000 units p.o. daily.  8. Calcium 600 mg p.o. daily.  9. Hydralazine recently increased to 75 mg p.o. 4 times a day.  10. Dexilant 60 mg p.o. daily. 11. Xanax 12.5 mg p.o. t.i.d.  12. Zofran as needed.  13. Catapres-TTS-2 patch topically q. week.   SOCIAL HISTORY: No alcohol or tobacco.   FAMILY HISTORY: Colon cancer.   REVIEW OF SYSTEMS: Please see history of present illness. Denies vision changes; no dysphagia; no headache; no chest pain or palpitations; no dyspnea; no change in bowels or bladder; chronic edema intermittently, without change in this. Remainder of complete review of systems is negative.   PHYSICAL EXAMINATION:   VITAL SIGNS: Temperature 97.5, pulse 96, blood pressure 163/60, weight 62 kg, saturation 98% on room air.   GENERAL: Elderly female, no distress.   EYES: Pupils round and reactive to light. Lids and conjunctivae unremarkable.   EARS, NOSE, AND THROAT: Seborrheic dermatitis. Oropharynx moist without lesions.   NECK: Supple. Trachea midline. No thyromegaly.   CARDIOVASCULAR: Regular rate and rhythm without murmurs, gallops, or rubs. Carotid and radial pulses 2+.   LUNGS: Clear bilaterally. No retractions.   ABDOMEN: Soft and slightly distended. Decreased bowel sounds without guard, rebound, mass.   SKIN: No other significant rashes or nodules.   LYMPH NODES: No cervical or supraclavicular nodes.   MUSCULOSKELETAL: No clubbing or cyanosis. No significant edema.  NEUROLOGIC: Cranial nerves are intact. Motor strength appears to be symmetrical with global weakness.   LABORATORY, DIAGNOSTIC, AND RADIOLOGICAL DATA: Urinalysis shows specific gravity 1.009, otherwise bland urine.   Troponin negative. White count 5.7 with hemoglobin 12.4, platelets 464. Sodium is 125 with potassium 3.3, BUN 9,  glucose 100, creatinine 0.67. Liver enzymes unremarkable.   She had had a CT of the head three days ago when she presented to the Emergency Room for complaints of facial numbness which revealed chronic small vessel disease with atrophy without other issues.   IMPRESSION AND PLAN:  1. Refractory nausea with symptomatic hyponatremia. Will place on IV fluids. Continue sodium and potassium to replete these. Would check remainder of electrolytes as well. Given recurrent hyponatremia as well as her labile hypertension, I will ask Nephrology to see her. I will hold the Benicar portion of Azor and continue the amlodipine. Will continue hydralazine and will change the Toprol XL to b.i.d. given its extended-release. Her medication intolerances have limited some treatment by report. Will hold on Catapres given use of hydralazine. Consider addition of nitrates. Consider changing amlodipine to b.i.d. if necessary. Would allow blood pressure to drift to 160/90 as an upper limit of normal given the labile nature of blood pressure in this elderly patient.  2. Refractory nausea. Certainly this may be driving the hyponatremia and will continue on proton pump inhibitors, adding Zofran as needed. Will get abdominal ultrasound and further investigate this as well as her kidneys and will get three-way of the abdomen today to make sure she has not developed constipation or other more easily treated issues.  3. Hypothyroidism. Her TSH remains quite elevated and we will titrate up her thyroid replacement. Also check free T4.  4. Osteoarthritis. Avoid anti-inflammatories. Tylenol if needed.  5. Depression. Continue on Cymbalta but certainly this may contribute to hyponatremia as well.  6. History of TIAs. It des not appears she's on any anticoagulants at this point. Hold on adding aspirin for now. Will use heparin sub-Q for DVT prophylaxis and provide some protection for this.   7. We will hold Vitamin D, calcium, or any other  nonessential medications for now. Will continue Xanax for now.  8. Further treatment to be based on her progress with the above.   ____________________________ Lynnea FerrierBert J. Klein III, MD bjk:drc D: 01/25/2012 18:42:00 ET T: 01/26/2012 06:00:02 ET JOB#: [id number]  cc: Curtis SitesBert J. Klein III, MD, <Dictator> Daniel NonesBERT KLEIN MD ELECTRONICALLY SIGNED 02/04/2012 19:36

## 2015-02-15 LAB — SURGICAL PATHOLOGY

## 2015-02-21 NOTE — Consult Note (Signed)
PATIENT NAME:  [name], [name] MR#:  [id number] DATE OF BIRTH:  Nov 30, 1928  DATE OF CONSULTATION:  11/15/2014  CONSULTING PHYSICIAN:  Christena Deem, MD  REASON FOR CONSULTATION: Abdominal pain, nausea, vomiting, diarrhea.   HISTORY OF PRESENT ILLNESS: [name] is a very pleasant, 81 year old, Caucasian female who was brought to the Emergency Room today with some nausea and vomiting and abdominal pain. She states that this past Friday, she awoke at about 4:00 a.m. with vomiting. Over the course of the weekend, she has been trying to keep Gatorade down, etc. She continues to have nausea and dry heaves. She has actually had nausea and dry heaves for several weeks now. Her appetite has been much decreased and feels that she has lost some weight over the past 2 months or so. She feels that these symptoms have been coming on for several months. She does state that she has a pain in her left side, like "something does not belong there was." That has been going on for several weeks. She has also had some diarrhea for the past several days but no black stool, blood in the stool or slimy stools. However, the patient also is not able to characterize her stools very well. She has been in an assisted care facility since September. She had recent back surgery.   PAST MEDICAL HISTORY: 1. Status post abdominal aortic aneurysm repair.  2. History of peripheral vascular disease.  3. Irritable bowel syndrome.  4. Hypertension.  5. RLS.  6. Obstructive sleep apnea.  7. Hypothyroidism.  8. Osteoarthritis.  9. She has had 4 back surgeries.  10. Cholecystectomy.  11. Status post right knee surgery, right total knee replacement.  12. She has had bilateral cataract surgery.  13. She has a history of bursitis in her right shoulder.  14. Hyponatremia on previous admission.  15. Carpal tunnel release.    OUTPATIENT MEDICATIONS: Include Benicar 40 mg once a day, bisacodyl 5 mg 2 tablets once a day, Carafate 1 gram  solution 4 times a day, citalopram 10 mg daily, demeclocycline 300 mg twice a day, Eliquis 5 mg twice a day, hydralazine 100 mg one 3 times a day, hydrocortisone 10 mg twice a day, levothyroxine 125 mcg daily, Mapap 325 mg 2 tablets every 8 hours p.r.n., metoprolol tartrate 100 mg twice a day, pantoprazole 40 mg twice a day, MiraLax daily to twice a day, potassium chloride 10 mEq once a day, pramipexole 1 mg daily at bedtime, tramadol 50 mg every 4 hours p.r.n. pain and vitamin D3 2000 international units once a day.   ALLERGIES: CODEINE, FUROSEMIDE, MACRODANTIN, PENICILLIN, SULFA, VALIUM, ZIAC, STRAWBERRY AND ADHESIVES.   REVIEW OF SYSTEMS: 10 systems reviewed. Per admission history and physical. Agree with same. Of note, she was also found to have atrial fibrillation on her evaluation in the emergency room.   PHYSICAL EXAMINATION: VITAL SIGNS: Temperature is 98.1, pulse 132 to 108, respirations 18, blood pressure 119/71, pulse oximetry 95%.   GENERAL: She is an elderly-appearing, Caucasian female, weak, some shortness of breath with talking. Her complexion is quite sallow.   HEAD: Normocephalic, atraumatic.   EYES: Anicteric.   NOSE: Septum midline.   OROPHARYNX: No lesions.   NECK: No JVD.   HEART: Irregular rate and rhythm.   LUNGS: Bilateral occasional coarse rhonchi and wheezing.   ABDOMEN: Doughy on palpation. She is quite tender to palpation in the left upper quadrant, extending more peripherally toward her left flank. There is minimal discomfort in the  rest of the abdomen with the exception of some mild discomfort in the right upper quadrant. There is no rebound. There is a fullness in the left upper quadrant. Bowel sounds are positive. They are indeed hyperactive.   EXTREMITIES: Cachectic-appearing. No clubbing or cyanosis.   LABORATORIES: Include the following: She had a glucose 166, BUN 26, creatinine 1.41, sodium 135, potassium 3.7, chloride 97, bicarbonate 25, calcium 9.1,  lipase 164. Her hepatic profile shows a total protein of 5.2, albumin 2.2, total bilirubin 0.6, alkaline phosphatase 180, AST 40, ALT 34, troponin I x 2 less than 0.02. Hemogram showing a white count of 19.8, hemoglobin and hematocrit 11.2/34.8, platelet count of 602,000. C. difficile was obtained, negative for toxin A and B. C. difficile GEH antigen negative as well. Urinalysis was negative.   DIAGNOSTIC DATA:  1. She had a CT scan of the abdomen and pelvis with contrast, this showing abnormal expansion of the pancreatic tail with surrounding edema and stranding, possibly representing focal pancreatitis. Multiple films were reviewed and there was no evidence of mass in this area, most recently on 09/16/2014.  2. Scattered pulmonary nodules stable from 09/16/2014 but slowly enlarging over several years.  3. Multiple new vertebral augmentations. New inferior endplate compression fracture along T-12.  4. Other findings including coronary atherosclerosis, small type 1 hiatal hernia, diffuse hepatic steatosis, mild left renal atrophy, some localized wall thickening at the splenic flexure felt likely due to secondary inflammation from the pancreatitis. There was osteoarthritis of both hips and a pelvic deformity with old fractures, pelvic floor laxity and rectocele.   ASSESSMENT: Abdominal pain, left upper quadrant, as well as nausea, vomiting and change of bowel habits with diarrhea. There are CT findings showing a mass in the tail of the pancreas. This would be inflammatory versus neoplastic versus possibly and less likely, an ischemic lesion. This would be quite unusual. It is of note that the patient's lipase is normal. Further, her symptoms, although becoming more acute this past Friday, the patient relates them as being intermittent for at least 2 months. The patient also relates weight loss over the past 2 months, although she has also undergone surgery and prolonged hospitalization/rehab.    RECOMMENDATION: 1. Will obtain a CA-19-9. If this is abnormal, may need to pursue PET scan versus MR scanning and possible biopsy.  2. Continue antibiotics as you are.  3. Continue treatment as a pancreatitis for now. Even if this is an acute inflammatory pancreatitis limited to the tail, there should be some elevation of lipase.   We will follow with you.    ____________________________ Christena DeemMartin U. Tomie Elko, MD mus:TT D: 11/15/2014 17:08:53 ET T: 11/15/2014 17:41:17 ET JOB#: [id number]  cc: Christena DeemMartin U. Dietrick Barris, MD, <Dictator> Christena DeemMARTIN U Emil Weigold MD ELECTRONICALLY SIGNED 11/16/2014 13:43

## 2015-02-21 NOTE — Consult Note (Signed)
Brief Consult Note: Diagnosis: T12 compression fx.   Patient was seen by consultant.   Consult note dictated.   Recommend further assessment or treatment.   Orders entered.   Discussed with Attending MD.   Comments: Will discuss pt. with Dr. Rosita KeaMenz in AM, who apparently performed a kyphoplasty on patient at another level in November, to see if she might be a candidate for another vertebroplasty.  Meanwhile, I have ordered her a L-S corset to see if this might provide her with some comfort when she sits up or transfers.  Thanks!.  Electronic Signatures: Ninfa LindenPoggi, Jeff (MD)  (Signed 24-Jan-16 16:41)  Authored: Brief Consult Note   Last Updated: 24-Jan-16 16:41 by Ninfa LindenPoggi, Jeff (MD)

## 2015-02-21 NOTE — Consult Note (Signed)
Chief Complaint:  Subjective/Chief Complaint seen for n/v abdominal pain. and diarrhea.  Today continues with n/v, note flagyl held.  continues with luq/flank pain.  several stools  less watery, pudding consistancy.   VITAL SIGNS/ANCILLARY NOTES: **Vital Signs.:   25-Jan-16 11:30  Vital Signs Type Routine  Temperature Temperature (F) 97.7  Celsius 36.5  Temperature Source oral  Pulse Pulse 75  Respirations Respirations 18  Systolic BP Systolic BP 90  Diastolic BP (mmHg) Diastolic BP (mmHg) 49  Mean BP 62  Pulse Ox % Pulse Ox % 96  Pulse Ox Activity Level  At rest  Oxygen Delivery Room Air/ 21 %  *Intake and Output.:   25-Jan-16 09:21  Stool  large amount of semi formed brown stool    09:39  Stool  med amount of semi formed brown stool    14:11  Stool  med amount non formed brown stool   Brief Assessment:  Cardiac Irregular   Respiratory coarse rhonchi   Gastrointestinal details normal Soft  Nondistended  No rebound tenderness  tender to palpation in the left upper quadrant extending laterally, bowel sounds positive.   Lab Results: Thyroid:  25-Jan-16 04:01   Thyroid Stimulating Hormone  4.95 (0.45-4.50 (IU = International Unit)  ----------------------- Pregnant patients have  different reference  ranges for TSH:  - - - - - - - - - -  Pregnant, first trimetser:  0.36 - 2.50 uIU/mL)  Hepatic:  25-Jan-16 04:01   Bilirubin, Total 0.5  Alkaline Phosphatase  131 (46-116 NOTE: New Reference Range 05/12/14)  SGPT (ALT) 22 (14-63 NOTE: New Reference Range 05/12/14)  SGOT (AST) 26  Total Protein, Serum  4.3  Albumin, Serum  1.6  Routine Chem:  25-Jan-16 04:01   Glucose, Serum 94  BUN  26  Creatinine (comp)  1.43  Sodium, Serum 138  Potassium, Serum  3.2  Chloride, Serum 104  CO2, Serum 23  Calcium (Total), Serum  7.4  Osmolality (calc) 280  eGFR (African American)  45  eGFR (Non-African American)  37 (eGFR values <30m/min/1.73 m2 may be an indication of  chronic kidney disease (CKD). Calculated eGFR, using the MRDR Study equation, is useful in  patients with stable renal function. The eGFR calculation will not be reliable in acutely ill patients when serum creatinine is changing rapidly. It is not useful in patients on dialysis. The eGFR calculation may not be applicable to patients at the low and high extremes of body sizes, pregnant women, and vegetarians.)  Anion Gap 11  Magnesium, Serum  0.8 (1.8-2.4 THERAPEUTIC RANGE: 4-7 mg/dL TOXIC: > 10 mg/dL  -----------------------)  Lipase 108 (Result(s) reported on 16 Nov 2014 at 04:53AM.)  Routine Hem:  25-Jan-16 04:01   WBC (CBC)  20.9  RBC (CBC)  3.10  Hemoglobin (CBC)  9.0  Hematocrit (CBC)  28.3  Platelet Count (CBC) 433  MCV 91  MCH 29.0  MCHC  31.8  RDW  20.1  Neutrophil % 92.5  Lymphocyte % 2.4  Monocyte % 5.0  Eosinophil % 0.0  Basophil % 0.1  Neutrophil #  19.4  Lymphocyte #  0.5  Monocyte #  1.0  Eosinophil # 0.0  Basophil # 0.0 (Result(s) reported on 16 Nov 2014 at 05:04AM.)   Radiology Results: Cardiology:    24-Jan-16 14:21, Echo Doppler  Echo Doppler   REASON FOR EXAM:      COMMENTS:       [facility]  - Nov 15 2014  2:21PM  RESULT: Echocardiogram Report    Patient Name:   [name] Date of Exam: 11/15/2014  Medical Rec #:  [id number]         Custom1:  Date of Birth:  [birth year]     Height:       64.0 in  Patient Age:    81 years       Weight:       127.0 lb  Patient Gender: F              BSA:          1.61 m??    Indications: Atrial Fib  Sonographer:    Arville Go RDCS  Referring Phys: LORD, REBECCA, L    Sonographer Comments: Technically difficult study due to poor echo   windows and suboptimal subcostal window.    Summary:   1. Left ventricular ejection fraction, by visual estimation, is 65 to   70%.   2. Normal global left ventricular systolic function.   3. Decreased left ventricular  internal cavity size.   4. Moderately dilated left atrium.   5. Moderately dilated right atrium.   6. Mild mitral valve regurgitation.   7. Mildly elevated pulmonary artery systolic pressure.   8. Mild to moderate tricuspid regurgitation.   9. Moderately increased left ventricular posterior wall thickness.  2D AND M-MODE MEASUREMENTS (normal ranges within parentheses):  Left Ventricle:          Normal  IVSd (2D):      1.30 cm (0.7-1.1)  LVPWd (2D):     1.33 cm (0.7-1.1) Aorta/LA:                  Normal  LVIDd (2D):     3.56 cm (3.4-5.7) Aortic Root (2D): 2.60 cm (2.4-3.7)  LVIDs (2D):     2.43 cm           Left Atrium (2D): 3.70 cm (1.9-4.0)  LV FS (2D):     31.7 %   (>25%)  LV EF (2D):     60.8 % (>50%)                                    Right Ventricle:                                    RVd (2D):  SPECTRAL DOPPLER ANALYSIS (where applicable):  Aortic Valve: AoV Max Vel: 1.62 m/s AoV Peak PG: 10.5 mmHg AoV Mean PG:  LVOT Vmax: 1.11 m/s LVOTVTI:  LVOT Diameter: 1.70 cm  AoV Area, Vmax: 1.56 cm?? AoV Area, VTI:  AoV Area, Vmn:  Tricuspid Valve and PA/RV Systolic Pressure: TR Max Velocity: 2.83 m/s RA   Pressure: 10 mmHg RVSP/PASP: 42.0 mmHg  Pulmonic Valve:  PV Max Velocity: 1.09 m/s PV Max PG: 4.8 mmHg PV Mean PG:    PHYSICIAN INTERPRETATION:  Left Ventricle: The left ventricular internal cavity size was decreased.   LV posterior wall thickness was moderately increased. Global LV systolic   function was normal. Left ventricular ejection fraction, by visual   estimation, is 65 to 70%.  Right Ventricle: Normal right ventricular size, wall thickness, and   systolic function. RV wall thickness is normal.  Left Atrium: The left atrium is moderately dilated.  Right Atrium: The right atrium is moderately dilated.  Pericardium: There  is no evidence of pericardial effusion.  Mitral Valve: Mild mitral valve regurgitation is seen.  Tricuspid Valve: Mild to moderate tricuspid regurgitation  is visualized.   The tricuspid regurgitant velocity is 2.83 m/s, and with an assumed right   atrial pressure of 10 mmHg, the estimated right ventricular systolic   pressure is mildly elevated at 42.0 mmHg.  Aortic Valve: The aortic valve is trileaflet and structurally normal,   with normal leaflet excursion; without any evidence of aortic stenosis or   insufficiency.  Aorta: The aortic root and ascending aorta are structurally normal, with   no evidence of dilitation.    [place] MD  Electronically signed by [id number] Serafina Royals MD  Signature Date/Time: 11/16/2014/1:11:39 PM  *** Final ***    IMPRESSION: .        Verified By: Corey Skains  ([facility]), M.D., MD  CT:    24-Jan-16 12:59, CT Abdomen and Pelvis With Contrast  CT Abdomen and Pelvis With Contrast   REASON FOR EXAM:    (1) ABD PAIN; (2) ABD PAIN  COMMENTS:       PROCEDURE: CT  - CT ABDOMEN / PELVIS  W  - Nov 15 2014 12:59PM     CLINICAL DATA:  Abdominal and pelvic pain for 3 days.    EXAM:  CT ABDOMEN AND PELVIS WITH CONTRAST    TECHNIQUE:  Multidetector CT imaging of the abdomen and pelvis was performed  using the standard protocol following bolus administration of  intravenous contrast.  CONTRAST:  75 cc Omnipaque 350    COMPARISON:  Multiple exams, including 09/16/2014    FINDINGS:  Lowerchest: There are pulmonary nodules at both lung bases with  elevation of the right hemidiaphragm. An index right lower lobe  pulmonary nodule on image 3 of series 4 measures 5 mm in diameter,  stable. Possible plugging of bronchiectatic right lower lobe  airways. Index nodule in the right lower lobe on image 8 of series 4  measures 1.0 by 0.8 cm 1.1 by 0.8 cm, previously 1.2 by 0.9 cm.  Coronary atherosclerosis noted along with a small type 1 hiatal  hernia.    Hepatobiliary: Diffuse hepatic steatosis.  Prior cholecystectomy.  Pancreas: New abnormal expansion of the pancreatic tail with  surrounding  stranding, images 19 through 28 of series 8.    Spleen: Unremarkable    Adrenals/Urinary Tract: Mild left renal atrophy. Mild distention of  the urinary bladder.    Stomach/Bowel: Mild wall thickening of the colon at the splenic  flexure, possibly reactive or due to local ileus given the adjacent  pancreatic findings. In the area of prior concern in the transverse  colon, I do not currently observed wall thickening. Somewhat mobile  cecum is medially positioned, containing air-fluid levels which can  be normal. There is some scattered air- fluid levels in small bowel.  Formed stool in the descending colon. Rectocele noted.  Vascular/Lymphatic: Aortoiliac atherosclerotic vascular disease.    Reproductive: Unremarkable    Other: No supplemental non-categorized findings.    Musculoskeletal: Considerable osteoarthritis of both hips. Deformity  of the right ischial tuberosity. Deformity of the left pubic rami  likely from old fractures. Prior lower lumbar posterior  decompression with posterolateral rod and pedicle screw fixation  compression fractures at T9, T11, inferior endplate of P37, L1, L 2,  L3, and L4. Compared to 11/20 5/15, there has been interval  vertebral augmentation at T9 and T11 as well as at L1 ; the  inferior  endplate at W54 demonstrates a new subtle fracture. The pedicle  screws at L2 capture the superior endplate bilaterally. Old  deformity in the lower sacrum near the sacrococcygeal junction.     IMPRESSION:  1. New abnormal expansion of the pancreatic tail with surrounding  edema and stranding, probably representing focal pancreatitis. There  is no prior mass in this vicinity on 09/16/2014.  2. Scattered pulmonary nodules are stable from 09/16/2014 but slowly  enlarging over the last several years. PET-CT or biopsy could be  utilized for further assessment.  3. Multiple new vertebral augmentations. There is a new inferior  endplate compression fracturealong  T12.  4. Other findings include coronary atherosclerosis; small type 1  hiatal hernia; diffuse hepatic steatosis ; mild left renal atrophy;  localized wall thickening at the splenic flexure likely due to  secondary inflammation from the pancreatitis; osteoarthritis of both  hips; pelvic deformities from old fractures; and pelvic floor laxity  with a rectocele.      Electronically Signed    By: Sherryl Barters M.D.    On: 11/15/2014 13:14         Verified By: Carron Curie, M.D.,   Assessment/Plan:  Assessment/Plan:  Assessment 1) n/v weight loss, diarrhea.  pancreatic mass on ct, inflammatory versus neoplastic.  some overlying inflammation of the colon in the splenic flexure region.   2) a fib, multiple medical issues with Status post abdominal aortic aneurysm repair. History of peripheral vascular disease. Irritable bowel syndrome. Hypertension. RLS. Obstructive sleep apnea.Hypothyroidism. Osteoarthritis. She has had 4 back surgeries. Cholecystectomy. Status post right knee surgery, right total knee replacement. She has had bilateral cataract surgery. She has a history of bursitis in her right shoulder, Hyponatremia on previous admission.  Carpal tunnel release.   Plan 1) continue abx awaiting blood culture results,  tomorrow will trial clears no red no carbonation. awaitng CA 19-9.  following.   Electronic Signatures: Loistine Simas (MD)  (Signed 25-Jan-16 17:18)  Authored: Chief Complaint, VITAL SIGNS/ANCILLARY NOTES, Brief Assessment, Lab Results, Radiology Results, Assessment/Plan   Last Updated: 25-Jan-16 17:18 by Loistine Simas (MD)

## 2015-02-21 NOTE — Discharge Summary (Signed)
PATIENT NAME:  [name], [name] MR#:  [id number] DATE OF BIRTH:  10/20/1929  DATE OF ADMISSION:  11/15/2014 DATE OF DISCHARGE:  11/24/2014  DISCHARGE DIAGNOSES: 1.  Pseudomonas colitis.  2.  Multiple vertebral compression fractures.  3.  Renal insufficiency.  4.  Acute pancreatitis.  5.  Progressive pulmonary nodules.  6.  Volatile hypertension.  7.  Severe malnutrition.  8.  Chronic nausea.  9.  Restless leg syndrome.  10.  Chronic constipation.  11.  Hypomagnesemia.   DISCHARGE MEDICATIONS: Benicar 40 mg daily, Eliquis 5 mg b.i.[name]., Synthroid 125 mcg daily, vitamin D3 2,000 units daily, metoprolol tartrate 100 mg b.i.[name]., Carafate 1 gram/10 mL 10 mL q.i.[name]., hydrocortisone 10 mg b.i.[name]., Tylenol 650 mg q. 4 p.r.n. pain, Mirapex 0.25 mg t.i.[name]., MiraLax 17 grams daily, magnesium oxide 400 mg daily.   REASON FOR ADMISSION: An 81 year old female who presents with renal insufficiency and severe diarrhea with severe back pain. Please see H and P for HPI, past medical history, and physical exam.   HOSPITAL COURSE: The patient was admitted, had severe diarrhea, and she was hydrated with normal saline. Her sodium came up to 140 and stayed at 140. Demeclocycline was discontinued and her sodium was stable. Stool culture grew pseudomonas and she was treated with IV Cipro/Rocephin for 8 days. Her diarrhea resolved. Her abdominal pain resolved and follow CT scan showed improvement in her colon and reduced pancreatitis. There was some concern about pancreatic CA as her CA-19.9 is elevated; however, with improvement on CT that makes malignancy less likely. She does have progressive pulmonary nodules, which she and the family have agreed to not pursue at this time. Her diarrhea has resolved and MiraLax will be restarted to avoid constipation. She received 8 grams of IV magnesium to replace her magnesium and will need oral chronic replacement. Her A-fib was rate controlled and she will remain on Eliquis. Hydralazine  was discontinued as it seemed to bother her diarrhea and she was intolerant to mirtazapine and somewhat to ondansetron and her dose of Mirapex was decreased to help her mental status. Overall prognosis is fair to poor, and she will be getting home health physical therapy at [facility]. Overall prognosis again is fair to poor.   ____________________________ Danella PentonMark F. Taci Sterling, MD mfm:sb [name]: 11/24/2014 12:58:56 ET T: 11/24/2014 13:17:35 ET JOB#: [id number]  cc: Danella PentonMark F. Lorelee Mclaurin, MD, <Dictator> Aidynn Krenn Sherlene ShamsF Amneet Cendejas MD ELECTRONICALLY SIGNED 11/25/2014 8:33

## 2015-02-21 NOTE — Consult Note (Signed)
Chief Complaint:  Subjective/Chief Complaint Covering for Dr. Gustavo Lah. Overall less abdominal pain. Lipase starting to drop. No diarrhea.   VITAL SIGNS/ANCILLARY NOTES: *Intake and Output.:   Daily 30-Jan-16 07:00  Grand Totals Intake:  1790 Output:  1600    Net:  190 24 Hr.:  190  Oral Intake      In:  240  IV (Primary)      In:  1200  IV (Secondary)      In:  350  Urine ml     Out:  1600  Length of Stay Totals Intake:  11031 Output:  3235    Net:  5732   Brief Assessment:  GEN no acute distress   Cardiac Regular   Respiratory clear BS   Gastrointestinal mild upper abdominal tenderness   Lab Results: Routine Chem:  30-Jan-16 03:55   Magnesium, Serum 2.1 (1.8-2.4 THERAPEUTIC RANGE: 4-7 mg/dL TOXIC: > 10 mg/dL  -----------------------)  Glucose, Serum 95  BUN 15  Creatinine (comp) 0.92  Sodium, Serum 140  Potassium, Serum 3.5  Chloride, Serum  109  CO2, Serum 25  Calcium (Total), Serum  7.4  Anion Gap  6  Osmolality (calc) 280  eGFR (African American) >60  eGFR (Non-African American) >60 (eGFR values <80mL/min/1.73 m2 may be an indication of chronic kidney disease (CKD). Calculated eGFR, using the MRDR Study equation, is useful in  patients with stable renal function. The eGFR calculation will not be reliable in acutely ill patients when serum creatinine is changing rapidly. It is not useful in patients on dialysis. The eGFR calculation may not be applicable to patients at the low and high extremes of body sizes, pregnant women, and vegetarians.)  Lipase  460 (Result(s) reported on 21 Nov 2014 at 04:51AM.)   Radiology Results: CT:    24-Jan-16 12:59, CT Abdomen and Pelvis With Contrast  CT Abdomen and Pelvis With Contrast   REASON FOR EXAM:    (1) ABD PAIN; (2) ABD PAIN  COMMENTS:       PROCEDURE: CT  - CT ABDOMEN / PELVIS  W  - Nov 15 2014 12:59PM     CLINICAL DATA:  Abdominal and pelvic pain for 3 days.    EXAM:  CT ABDOMEN AND PELVIS WITH  CONTRAST    TECHNIQUE:  Multidetector CT imaging of the abdomen and pelvis was performed  using the standard protocol following bolus administration of  intravenous contrast.  CONTRAST:  75 cc Omnipaque 350    COMPARISON:  Multiple exams, including 09/16/2014    FINDINGS:  Lowerchest: There are pulmonary nodules at both lung bases with  elevation of the right hemidiaphragm. An index right lower lobe  pulmonary nodule on image 3 of series 4 measures 5 mm in diameter,  stable. Possible plugging of bronchiectatic right lower lobe  airways. Index nodule in the right lower lobe on image 8 of series 4  measures 1.0 by 0.8 cm 1.1 by 0.8 cm, previously 1.2 by 0.9 cm.  Coronary atherosclerosis noted along with a small type 1 hiatal  hernia.    Hepatobiliary: Diffuse hepatic steatosis.  Prior cholecystectomy.  Pancreas: New abnormal expansion of the pancreatic tail with  surrounding stranding, images 19 through 28 of series 8.    Spleen: Unremarkable    Adrenals/Urinary Tract: Mild left renal atrophy. Mild distention of  the urinary bladder.    Stomach/Bowel: Mild wall thickening of the colon at the splenic  flexure, possibly reactive or due to local ileus given the adjacent  pancreatic findings. In the area of prior concern in the transverse  colon, I do not currently observed wall thickening. Somewhat mobile  cecum is medially positioned, containing air-fluid levels which can  be normal. There is some scattered air- fluid levels in small bowel.  Formed stool in the descending colon. Rectocele noted.  Vascular/Lymphatic: Aortoiliac atherosclerotic vascular disease.    Reproductive: Unremarkable    Other: No supplemental non-categorized findings.    Musculoskeletal: Considerable osteoarthritis of both hips. Deformity  of the right ischial tuberosity. Deformity of the left pubic rami  likely from old fractures. Prior lower lumbar posterior  decompression with posterolateral rod and  pedicle screw fixation  compression fractures at T9, T11, inferior endplate of K20, L1, L 2,  L3, and L4. Compared to 11/20 5/15, there has been interval  vertebral augmentation at T9 and T11 as well as at L1 ; the inferior  endplate at S13 demonstrates a new subtle fracture. The pedicle  screws at L2 capture the superior endplate bilaterally. Old  deformity in the lower sacrum near the sacrococcygeal junction.     IMPRESSION:  1. New abnormal expansion of the pancreatic tail with surrounding  edema and stranding, probably representing focal pancreatitis. There  is no prior mass in this vicinity on 09/16/2014.  2. Scattered pulmonary nodules are stable from 09/16/2014 but slowly  enlarging over the last several years. PET-CT or biopsy could be  utilized for further assessment.  3. Multiple new vertebral augmentations. There is a new inferior  endplate compression fracturealong T12.  4. Other findings include coronary atherosclerosis; small type 1  hiatal hernia; diffuse hepatic steatosis ; mild left renal atrophy;  localized wall thickening at the splenic flexure likely due to  secondary inflammation from the pancreatitis; osteoarthritis of both  hips; pelvic deformities from old fractures; and pelvic floor laxity  with a rectocele.      Electronically Signed    By: Sherryl Barters M.D.    On: 11/15/2014 13:14         Verified By: Carron Curie, M.D.,   Assessment/Plan:  Assessment/Plan:  Assessment Colitis. Clinically resolved. Pancreatitis with elevated CA 19-9 level. Lipase improving. Pulmonary nodules. NO SOB today.   Plan Continue to moniter lipase. Repeat CT pending on Monday.   Electronic Signatures: Verdie Shire (MD)  (Signed 30-Jan-16 09:36)  Authored: Chief Complaint, VITAL SIGNS/ANCILLARY NOTES, Brief Assessment, Lab Results, Radiology Results, Assessment/Plan   Last Updated: 30-Jan-16 09:36 by Verdie Shire (MD)

## 2015-02-21 NOTE — Consult Note (Signed)
Chief Complaint:  Subjective/Chief Complaint continues with nausea and abdominal pain, though improved from admission.   VITAL SIGNS/ANCILLARY NOTES: **Vital Signs.:   27-Jan-16 11:21  Temperature Temperature (F) 97.5  Celsius 36.3  Temperature Source oral  Pulse Pulse 62  Respirations Respirations 18  Systolic BP Systolic BP 169  Diastolic BP (mmHg) Diastolic BP (mmHg) 66  Mean BP 81  Pulse Ox % Pulse Ox % 96  Pulse Ox Activity Level  At rest  Oxygen Delivery Room Air/ 21 %   Brief Assessment:  Cardiac Irregular   Respiratory clear BS   Gastrointestinal details normal Soft  Nondistended  No masses palpable  Bowel sounds normal  No rebound tenderness  tender to palpation in the luq, to lateral .   Lab Results: Routine Chem:  27-Jan-16 04:07   Magnesium, Serum  1.6 (1.8-2.4 THERAPEUTIC RANGE: 4-7 mg/dL TOXIC: > 10 mg/dL  -----------------------)  Glucose, Serum 66  BUN  26  Creatinine (comp)  1.51  Sodium, Serum 139  Potassium, Serum 3.5  Chloride, Serum 107  CO2, Serum 22  Calcium (Total), Serum  7.3  Anion Gap 10  Osmolality (calc) 280  eGFR (African American)  42  eGFR (Non-African American)  35 (eGFR values <50mL/min/1.73 m2 may be an indication of chronic kidney disease (CKD). Calculated eGFR, using the MRDR Study equation, is useful in  patients with stable renal function. The eGFR calculation will not be reliable in acutely ill patients when serum creatinine is changing rapidly. It is not useful in patients on dialysis. The eGFR calculation may not be applicable to patients at the low and high extremes of body sizes, pregnant women, and vegetarians.)  Routine Hem:  24-Jan-16 09:32   WBC (CBC)  19.8  25-Jan-16 04:01   WBC (CBC)  20.9  26-Jan-16 03:56   WBC (CBC)  13.3  27-Jan-16 04:07   WBC (CBC)  11.5  RBC (CBC)  2.91  Hemoglobin (CBC)  8.4  Hematocrit (CBC)  26.5  Platelet Count (CBC) 429  MCV 91  MCH 29.0  MCHC  31.9  RDW  20.0   Neutrophil % 88.2  Lymphocyte % 6.1  Monocyte % 5.5  Eosinophil % 0.1  Basophil % 0.1  Neutrophil #  10.1  Lymphocyte #  0.7  Monocyte # 0.6  Eosinophil # 0.0  Basophil # 0.0 (Result(s) reported on 18 Nov 2014 at 05:12AM.)   Assessment/Plan:  Assessment/Plan:  Assessment 1) abdominal pain, n/v improving slowly.  the area of pain seems to be improving in distribution/size.   Plan 1) continue current.  will recheck lipase tomorrow.   Electronic Signatures: Loistine Simas (MD)  (Signed 27-Jan-16 13:51)  Authored: Chief Complaint, VITAL SIGNS/ANCILLARY NOTES, Brief Assessment, Lab Results, Assessment/Plan   Last Updated: 27-Jan-16 13:51 by Loistine Simas (MD)

## 2015-02-21 NOTE — Consult Note (Signed)
Chief Complaint:  Subjective/Chief Complaint Resting quietly now, complains of back pain when aroused.   VITAL SIGNS/ANCILLARY NOTES: **Vital Signs.:   25-Jan-16 00:24  Temperature Temperature (F) 98.1  Pulse Pulse 95  Respirations Respirations 18  Systolic BP Systolic BP 125  Diastolic BP (mmHg) Diastolic BP (mmHg) 55    03:29  Temperature Temperature (F) 98.5  Pulse Pulse 108  Respirations Respirations 18  Systolic BP Systolic BP 113  Diastolic BP (mmHg) Diastolic BP (mmHg) 69    07:36  Temperature Temperature (F) 98.1  Pulse Pulse 100  Respirations Respirations 18  Systolic BP Systolic BP 119  Diastolic BP (mmHg) Diastolic BP (mmHg) 64   Brief Assessment:  GEN no acute distress   Additional Physical Exam Back: Tenderness along mid-back unchanged vs. yesterday.           NV status unchanged vs. yesterday.   Assessment/Plan:  Assessment/Plan:  Assessment T12 compression fx   Plan Will discuss patient with Dr. Rosita KeaMenz today to determine if she might be a candidate for a vertebroplasty. Meanwhile, continue present medication regimen and use L-S corset if OOB.   Electronic Signatures: Ninfa LindenPoggi, Jeff (MD)  (Signed 25-Jan-16 10:17)  Authored: Chief Complaint, VITAL SIGNS/ANCILLARY NOTES, Brief Assessment, Assessment/Plan   Last Updated: 25-Jan-16 10:17 by Ninfa LindenPoggi, Jeff (MD)

## 2015-02-21 NOTE — Consult Note (Signed)
PATIENT NAME:  [name], [name] MR#:  [id number] DATE OF BIRTH:  16-Jun-1929  DATE OF CONSULTATION:  11/15/2014  REFERRING PHYSICIAN:  Aram Beecham, MD CONSULTING PHYSICIAN:  Maryagnes Amos, MD  CHIEF COMPLAINT:  I have been asked by Dr. Judithann Sheen  to evaluate this pleasant woman for lower back pain.   Briefly, she is an 81 year old female with multiple medical problems including hypothyroidism, irritable bowel syndrome, hypertension, and cardiac arrhythmias who presented to the Emergency Room with a two day history of left-sided abdominal pain associated with nausea, vomiting, and diarrhea.  In the Emergency Room, she was noted to be in rapid atrial fibrillation was dehydrated.  A CT scan of her abdomen was obtained which demonstrated a T12 compression fracture. The patient has a long history of back issues, having undergone 4 surgeries in the past by a spine surgeon in [place]. At this point, she appears to have a posterior fusion with instrumentation from L2 through L5.  Apparently, she had a previous compression fracture, perhaps at T9, which was treated with a kyphoplasty by Dr. Rosita Kea in early November. The patient states went well for her. She denies any recent falls that may have contributed to her present back symptoms. She is presently in a nursing home and is unable to transfer herself from the bed to the wheelchair. She is nonambulatory at present, using a wheelchair. Up to approximately 10 days ago, she was able to pivot on her right leg. Her left leg has been left with weakness as a result her numerous back surgeries. She also has been receiving epidural steroid injections into her lumbar spine by Dr. Yves Dill at the [facility], the last of which was in early December.   PAST MEDICAL HISTORY: As noted above.   PERTINENT PAST SURGICAL HISTORY:  Also is as noted above.   PHYSICAL EXAMINATION:  We have a pleasant, elderly female resting comfortably in bed. She is alert and oriented x3.  She does have somewhat chipmunk appearance, due to her history of long-term prednisone use. Orthopedic: Examination is limited to her back and lower extremities. Skin inspection of her back is notable for numerous well-healed surgical midline incisions, but otherwise is unremarkable. She does have some tenderness to percussion over the mid back region near the thoracolumbar junction. Examination of the right lower extremity demonstrates her to be neurologically intact as she has a 4+/5 strength with resisted ankle dorsiflexion, ankle plantar flexion, ankle, inversion, and ankle version. She can also extend her toes and with good strength against resistance. She has intact sensation of light touch to all distributions of the right foot. She has good capillary refill to her right foot.  Examination of the left foot demonstrates the foot to be held in a somewhat plantarflexed inverted position. This can be passively corrected to neutral.  She has no pain with passive motion of the foot. She has only a flicker of ankle dorsiflexion and only a flicker ankle of ankle inversion and eversion. She also has perhaps 2/5 strength with resisted plantar flexion. Sensation appears to be intact to light touch to all distributions. She has good capillary refill to her left foot.   DATA:  A recent CT scan of the abdomen is available for review. These films demonstrate what  appears to be an acute T12 compression fracture with minimal compression.  There is no evidence for any retropulsion of the posterior wall of the vertebral body to compromise the spinal canal.    IMPRESSION:  Acute  T12 compression fracture.   PLAN: The treatment options were discussed with the patient. At this point, I have suggested that we obtain a lumbosacral corset to see if this provides any increased comfort for her when she is, either sitting up or trying to transfer to the bed to chair. In the meanwhile, I will discuss [place] with Dr. Rosita KeaMenz  tomorrow to see if he feels that she might be a good candidate for another vertebroplasty at this level. Meanwhile, I will continue her present medication regimen to help with the pain.   Thank you for asking me to participate in the care of this most pleasant yet unfortunate woman. We will be happy to follow her with you.    ____________________________ J. Derald MacleodJeffrey Poggi, MD jjp:at D: 11/15/2014 16:49:46 ET T: 11/15/2014 17:33:52 ET JOB#: [id number]  cc: Maryagnes AmosJ. Jeffrey Poggi, MD, <Dictator> JEFF Fidel LevyJ POGGI MD ELECTRONICALLY SIGNED 11/17/2014 17:33

## 2015-02-21 NOTE — Consult Note (Signed)
Chief Complaint:  Subjective/Chief Complaint c/o being cold.  abdominal pain about the same.  no n/v today.  overall not feeling as good. no diarrhea.   VITAL SIGNS/ANCILLARY NOTES: **Vital Signs.:   29-Jan-16 11:29  Vital Signs Type Q 4hr  Temperature Temperature (F) 97.6  Celsius 36.4  Temperature Source oral  Pulse Pulse 57  Respirations Respirations 18  Systolic BP Systolic BP 626  Diastolic BP (mmHg) Diastolic BP (mmHg) 62  Mean BP 79  Pulse Ox % Pulse Ox % 91  Pulse Ox Activity Level  At rest  Oxygen Delivery Room Air/ 21 %   Brief Assessment:  Cardiac Regular   Respiratory clear BS   Gastrointestinal details normal Soft  Nondistended  No masses palpable  no tenderness to palpation, bowel sounds positive.   Lab Results: Hepatic:  29-Jan-16 03:48   Bilirubin, Total 0.2  Bilirubin, Direct < 0.1 (Result(s) reported on 20 Nov 2014 at 05:18AM.)  Alkaline Phosphatase  184  SGPT (ALT) 28  SGOT (AST) 28  Total Protein, Serum  4.9  Albumin, Serum  1.9  Routine Chem:  25-Jan-16 04:01   Lipase 108 (Result(s) reported on 16 Nov 2014 at 04:53AM.)  28-Jan-16 04:26   Lipase  406 (Result(s) reported on 19 Nov 2014 at 04:57AM.)  29-Jan-16 03:48   Glucose, Serum  106  BUN 17  Creatinine (comp) 1.23  Sodium, Serum 140  Potassium, Serum 3.7  Chloride, Serum 107  CO2, Serum 25  Calcium (Total), Serum  7.7  Anion Gap 8  Osmolality (calc) 281  eGFR (African American)  54  eGFR (Non-African American)  44 (eGFR values <23mL/min/1.73 m2 may be an indication of chronic kidney disease (CKD). Calculated eGFR, using the MRDR Study equation, is useful in  patients with stable renal function. The eGFR calculation will not be reliable in acutely ill patients when serum creatinine is changing rapidly. It is not useful in patients on dialysis. The eGFR calculation may not be applicable to patients at the low and high extremes of body sizes, pregnant women, and vegetarians.)  Lipase   589 (Result(s) reported on 20 Nov 2014 at 05:12AM.)  Magnesium, Serum  1.1 (1.8-2.4 THERAPEUTIC RANGE: 4-7 mg/dL TOXIC: > 10 mg/dL  -----------------------)  Routine Hem:  25-Jan-16 04:01   WBC (CBC)  20.9  26-Jan-16 03:56   WBC (CBC)  13.3  27-Jan-16 04:07   WBC (CBC)  11.5  29-Jan-16 03:48   WBC (CBC) 10.7  RBC (CBC)  3.16  Hemoglobin (CBC)  9.2  Hematocrit (CBC)  28.3  Platelet Count (CBC)  482  MCV 90  MCH 29.0  MCHC 32.4  RDW  20.1  Neutrophil % 86.5  Lymphocyte % 6.3  Monocyte % 6.3  Eosinophil % 0.8  Basophil % 0.1  Neutrophil #  9.3  Lymphocyte #  0.7  Monocyte # 0.7  Eosinophil # 0.1  Basophil # 0.0 (Result(s) reported on 20 Nov 2014 at 05:18AM.)   Assessment/Plan:  Assessment/Plan:  Assessment 1)abdominal pain, n/v-improving.  Abnormal ct-pancreatic inflammatory mass. lipase slowly increasing low elevation.  2) general decline today, Dr Sanjuan Dame note appreciated.   Plan 1) agree with repeating CT.   Dr Candace Cruise covering the weekend.   Electronic Signatures: Loistine Simas (MD)  (Signed 29-Jan-16 17:23)  Authored: Chief Complaint, VITAL SIGNS/ANCILLARY NOTES, Brief Assessment, Lab Results, Assessment/Plan   Last Updated: 29-Jan-16 17:23 by Loistine Simas (MD)

## 2015-02-21 NOTE — H&P (Signed)
PATIENT NAME:  [name], [name] MR#:  [id number] DATE OF BIRTH:  1929/05/25  DATE OF ADMISSION:  11/15/2014  REFERRING PHYSICIAN: Lurena Joiner L. Shaune Pollack, MD   FAMILY PHYSICIAN: Bethann Punches, MD   REASON FOR ADMISSION: Abdominal pain.   HISTORY OF PRESENT ILLNESS: The patient is an 81 year old female with a history of multiple medical problems including hypothyroidism, IBS, benign hypertension and cardiac arrhythmias who presents to the Emergency Room with a 2 day history of left-sided abdominal pain associated with nausea, vomiting and diarrhea. In the Emergency Room, the patient was noted to be in rapid atrial fibrillation. She was dehydrated. CT suggests colitis and pancreatitis, even though her lipase is normal. Bilateral pulmonary nodules were also noted on CT. She is now admitted for further evaluation.   PAST MEDICAL HISTORY:  1.  Status post abdominal aortic aneurysm repair.  2.  Peripheral vascular disease.  3.  IBS.  4.  Benign hypertension.  5.  Restless legs syndrome.  6.  History of sleep apnea.  7.  Osteoarthritis.  8.  Hypothyroidism.  9.  Status post back surgery.  10.  Status post cholecystectomy. 11.  Status post right knee surgery.   MEDICATIONS:  1.  Tramadol 50 mg p.o. q. 4 hours p.r.n. pain.  2.  Mirapex 1 mg p.o. at bedtime.  3.  Klor-Con 10 mEq p.o. daily.  4.  MiraLax 17 grams p.o. daily.  5.  Protonix 40 mg p.o. b.i.d.  6.  Lopressor 100 mg p.o. b.i.d.   7.  Synthroid 125 mcg p.o. daily.  8.  Hydrocortisone 10 mg p.o. b.i.d.  9.  Hydralazine 100 mg p.o. t.i.d.  10.  Eliquis 5 mg p.o. b.i.d.  11.  Demeclocycline 300 mg p.o. b.i.d.  12.  Celexa 10 mg p.o. daily.  13.  Carafate 1 gram p.o. q.i.d.  14.  Benicar 40 mg p.o. daily.   ALLERGIES: SULFA, CODEINE, MACROBID, PENICILLIN, LASIX, VALIUM AND ZIAC.   SOCIAL HISTORY: Negative for alcohol or tobacco abuse.   FAMILY HISTORY: Positive for coronary artery disease, hypertension, diabetes, stroke. Negative for  breast or colon cancer.   REVIEW OF SYSTEMS: CONSTITUTIONAL: No fever or change in weight.  EYES: No blurred or double vision. No glaucoma.  EARS, NOSE AND THROAT: No tinnitus or hearing loss. No nasal discharge or bleeding. No difficulty swallowing.  RESPIRATORY: No cough or wheezing. Denies hemoptysis. No painful respiration.  CARDIOVASCULAR: No chest pain or orthopnea. Denies syncope. Some palpitations.  GASTROINTESTINAL: The patient has had nausea, vomiting, diarrhea and abdominal pain.  GENITOURINARY: No dysuria or hematuria. No incontinence.  ENDOCRINE: No polyuria or polydipsia. No heat or cold intolerance.  HEMATOLOGIC: The patient denies anemia, easy bruising or bleeding.  LYMPHATIC: No swollen glands.  MUSCULOSKELETAL: The patient denies pain in her neck, back, shoulders, knees, hips. No gout.  NEUROLOGIC: No numbness or migraines. Denies stroke or seizures.  PSYCHOLOGICAL: The patient denies anxiety, insomnia or depression.   PHYSICAL EXAMINATION:  GENERAL: The patient is in no acute distress.  VITAL SIGNS: Currently remarkable for a blood pressure of 134/85, with a heart rate of 112 and a respiratory rate of 18, temperature of 97.6 and a saturation of 97% on room air.  HEENT: Normocephalic, atraumatic. Pupils equal, round and reactive to light and accommodation. Extraocular movements are intact. Sclerae are nonicteric. Conjunctivae are clear. Oropharynx is dry but clear.  NECK: Supple without JVD. No adenopathy or thyromegaly is noted.  LUNGS: Reveal scattered rhonchi without wheezes or rales. No  dullness. Respiratory effort is normal.  CARDIAC: Irregularly irregular rhythm with a rapid rate. No significant rubs or gallops. PMI is nondisplaced. Chest wall is nontender.  ABDOMEN: Soft but tender, especially on the left side. No guarding or rebound. Normoactive bowel sounds. No organomegaly or masses were appreciated. No hernias or bruits were noted.  EXTREMITIES: Without clubbing,  cyanosis or edema. Pulses were 2+ bilaterally.  SKIN: Warm and dry without rash or lesions.  NEUROLOGIC: Cranial nerves II-XII grossly intact. Deep tendon reflexes were symmetric. Motor and sensory examinations nonfocal.  PSYCHIATRIC: Revealed a patient who is alert and oriented to person, place and time. She was cooperative and used good judgment.   LABORATORY DATA: EKG revealed atrial fibrillation at 120 beats per minute with no acute ischemic changes. CT scan of the abdomen and pelvis revealed scattered pulmonary nodules. Also noted was an abnormal expansion in the pancreatic tail with surrounding edema, probably representing focal pancreatitis. Also noted was a T12 compression fracture.   Her glucose was 166 with a BUN of 26, creatinine of 1.41 and a GFR of 38. Sodium 135 with a potassium of 3.7. Alkaline phosphatase was 180 with an ALT of 34 and an SGOT of 40. Lipase was 164. Troponin was less than 0.02. Her white count was 19.8 with a hemoglobin of 11.2. Urinalysis was nondiagnostic.   ASSESSMENT:  1.  Abdominal pain associated with nausea, vomiting and diarrhea.  2.  Dehydration.  3.  Questionable colitis.  4.  Questionable pancreatitis.  5.  Rapid atrial fibrillation.  6.  Acute dehydration with renal insufficiency.  7.  Pulmonary nodules.  8.  T12 compression fracture.   PLAN: The patient will be admitted to the floor with telemetry as a full code. She will be started on IV fluids and empiric IV antibiotics. We will consult GI, cardiology and orthopedics. We will also obtain a physical therapy consult and social work consult for placement. We will keep her n.p.o. except for ice chips and medications at this time. Cultures have been sent off. We will add low-dose oral Cardizem for rate control. Follow up routine labs in the morning. We will follow serial cardiac enzymes and obtain an echocardiogram because of her atrial fibrillation. We will also check a TSH. Further treatment and  evaluation will depend upon the patient's progress.   TOTAL TIME SPENT ON THIS PATIENT: Fifty minutes.    ____________________________ Duane LopeJeffrey D. Judithann SheenSparks, MD jds:TT D: 11/15/2014 14:25:56 ET T: 11/15/2014 15:19:29 ET JOB#: [id number]  cc: Duane LopeJeffrey D. Judithann SheenSparks, MD, <Dictator> Danella PentonMark F. Miller, MD Nilani Hugill D Rex Oesterle MD ELECTRONICALLY SIGNED 11/15/2014 18:07

## 2015-02-21 NOTE — Consult Note (Signed)
Chief Complaint:  Subjective/Chief Complaint seen for n/v abd pain, diarrhea.  continues with nausea, no emesis, continues with abdominal pain, 5/10, 2 loose stools today per patient.   VITAL SIGNS/ANCILLARY NOTES: **Vital Signs.:   28-Jan-16 11:41  Temperature Temperature (F) 97.5  Celsius 36.3  Temperature Source oral  Pulse Pulse 59  Respirations Respirations 18  Systolic BP Systolic BP 121  Diastolic BP (mmHg) Diastolic BP (mmHg) 68  Mean BP 85  Pulse Ox % Pulse Ox % 94  Pulse Ox Activity Level  At rest  Oxygen Delivery Room Air/ 21 %   Brief Assessment:  Cardiac Irregular   Respiratory clear BS   Gastrointestinal details normal Soft  Nondistended  Bowel sounds normal  No rebound tenderness  tender left abdomen,   Lab Results: Oncology:  24-Jan-16 20:27   Carbohydrate Antigen 19-9  46 [facility](Roche ECLIA methodology            [facility]            No: [id number]           [address], [place], [place] [id number]           Mila HomerWilliam F Hancock, MD         [phone] Result(s) reported on 17 Nov 2014 at 12:18PM.)  Routine Micro:  25-Jan-16 19:07   Organism Name PSEUDOMONAS AERUGINOSA  Organism Quantity HEAVY GROWTH  Micro Text Report STOOL COMPREHENSIVE   ORGANISM 1                HEAVY GROWTH PSEUDOMONAS AERUGINOSA   COMMENT                   SENSITIVITIES TO FOLLOW   COMMENT                   NO PATHOGENIC E.COLI DETECTED   COMMENT                   NO CAMPYLOBACTER ANTIGENDETECTED   ANTIBIOTIC                       Organism 1 HEAVY GROWTH PSEUDOMONAS AERUGINOSA  Culture Comment SENSITIVITIES TO FOLLOW  Culture Comment . NO PATHOGENIC E.COLI DETECTED  Culture Comment    . NO CAMPYLOBACTER ANTIGEN DETECTED  Routine Chem:  25-Jan-16 19:07   Result Comment STOOL CULTURE - NORMAL FECAL FLORA REDUCED.  Result(s) reported on 19 Nov 2014 at 09:42AM.  28-Jan-16 04:26   Lipase  406 (Result(s) reported on 19 Nov 2014 at 04:57AM.)   Assessment/Plan:   Assessment/Plan:  Assessment 1) n/v diarrhea, elevated lipase, abnormal pancreas on ct, probable inflammatory mass in the tail of the pancreas.  2) a fib, multiple medical issues with Status post abdominal aortic aneurysm repair. History of peripheral vascular disease. Irritable bowel syndrome. Hypertension. RLS. Obstructive sleep apnea.Hypothyroidism. Osteoarthritis. She has had 4 back surgeries. Cholecystectomy. Status post right knee surgery, right total knee replacement. She has had bilateral cataract surgery. She has a history of bursitis in her right shoulder, Hyponatremia on previous admission.  Carpal tunnel release.   Plan 1) some increase lipase since admission, patient states pain is much better but other symptoms only marginally improved, though less frequent/watery diarrhea.  Of note stool culture is positive for pseudomonas.  This is an unusual cause of infectious diarrhea, usually indicating a nosocomial infection in an immunocompromised host.  Discussed anabiogram with pharmacy, and at our institution cipro still covers this well (  97%).  Continue current.  Recommend repeat abd/plevic ct on monday, will recheck lfts and cbc in am.  following.   Electronic Signatures: Barnetta Chapel (MD)  (Signed 28-Jan-16 16:41)  Authored: Chief Complaint, VITAL SIGNS/ANCILLARY NOTES, Brief Assessment, Lab Results, Assessment/Plan   Last Updated: 28-Jan-16 16:41 by Barnetta Chapel (MD)

## 2015-02-21 NOTE — Consult Note (Signed)
Chief Complaint:  Subjective/Chief Complaint seen for n/v abdominal pain and diarrhea.   beginning to feel better, less diarrhea today, less abd pain, n/v.   VITAL SIGNS/ANCILLARY NOTES: **Vital Signs.:   26-Jan-16 11:42  Vital Signs Type Routine  Temperature Temperature (F) 97.5  Celsius 36.3  Temperature Source oral  Pulse Pulse 59  Respirations Respirations 18  Systolic BP Systolic BP 470  Diastolic BP (mmHg) Diastolic BP (mmHg) 63  Mean BP 83  Pulse Ox % Pulse Ox % 98  Pulse Ox Activity Level  At rest  Oxygen Delivery Room Air/ 21 %   Brief Assessment:  Cardiac Irregular   Respiratory clear BS   Gastrointestinal details normal Soft  Nondistended  Bowel sounds normal  No rebound tenderness  mild discomfort luq.   Lab Results: Hepatic:  26-Jan-16 03:56   Bilirubin, Total 0.2  Alkaline Phosphatase  128  SGPT (ALT) 23  SGOT (AST) 23  Total Protein, Serum  4.2  Albumin, Serum  1.7  Oncology:  24-Jan-16 20:27   Carbohydrate Antigen 19-9  46 ([facility] methodology            [facility]            No: [id number]           [address], [place], [place] [address]           Lindon Romp, MD         [phone] Result(s) reported on 17 Nov 2014 at 12:18PM.)  Routine Chem:  26-Jan-16 03:56   Glucose, Serum 83  BUN  26  Creatinine (comp)  1.65  Sodium, Serum 138  Potassium, Serum  3.3  Chloride, Serum 107  CO2, Serum 21  Calcium (Total), Serum  7.1  Osmolality (calc) 280  eGFR (African American)  38  eGFR (Non-African American)  32 (eGFR values <76m/min/1.73 m2 may be an indication of chronic kidney disease (CKD). Calculated eGFR, using the MRDR Study equation, is useful in  patients with stable renal function. The eGFR calculation will not be reliable in acutely ill patients when serum creatinine is changing rapidly. It is not useful in patients on dialysis. The eGFR calculation may not be applicable to patients at the low and high  extremes of body sizes, pregnant women, and vegetarians.)  Anion Gap 10  Magnesium, Serum  1.1 (1.8-2.4 THERAPEUTIC RANGE: 4-7 mg/dL TOXIC: > 10 mg/dL  -----------------------)  Routine Hem:  24-Jan-16 09:32   WBC (CBC)  19.8  25-Jan-16 04:01   WBC (CBC)  20.9  26-Jan-16 03:56   WBC (CBC)  13.3  RBC (CBC)  2.86  Hemoglobin (CBC)  8.4  Hematocrit (CBC)  25.9  Platelet Count (CBC) 416  MCV 91  MCH 29.3  MCHC 32.4  RDW  19.9  Neutrophil % 94.1  Lymphocyte % 3.8  Monocyte % 2.0  Eosinophil % 0.0  Basophil % 0.1  Neutrophil #  12.5  Lymphocyte #  0.5  Monocyte # 0.3  Eosinophil # 0.0  Basophil # 0.0 (Result(s) reported on 17 Nov 2014 at 05:10AM.)   Assessment/Plan:  Assessment/Plan:  Assessment 1)  abdominal pain, n/v/diarrhea.  all symptoms improving.   tolerating po, though some nausea.   ca19-9 minimal elevation, likely form the inflammation in the tail of the pancreas.  2) a fib, multiple medical issues with Status post abdominal aortic aneurysm repair. History of peripheral vascular disease. Irritable bowel syndrome. Hypertension. RLS. Obstructive sleep apnea.Hypothyroidism. Osteoarthritis. She has had 4 back surgeries.  Cholecystectomy. Status post right knee surgery, right total knee replacement. She has had bilateral cataract surgery. She has a history of bursitis in her right shoulder, Hyponatremia on previous admission.  Carpal tunnel release   Plan 1) continue current abx.  recommend repeat CT in about 10 days, repeat CA 19-9 in 2-3 months.  discussed with Dr Sabra Heck.   Electronic Signatures: Loistine Simas (MD)  (Signed 26-Jan-16 16:25)  Authored: Chief Complaint, VITAL SIGNS/ANCILLARY NOTES, Brief Assessment, Lab Results, Assessment/Plan   Last Updated: 26-Jan-16 16:25 by Loistine Simas (MD)

## 2015-02-21 NOTE — Consult Note (Signed)
Chief Complaint:  Subjective/Chief Complaint less abdominal pain, tolerating small meals of regular food.  mild nausea no emesis.  some disorientation the last 3 nights.  no ct today due to technical problems.   VITAL SIGNS/ANCILLARY NOTES: **Vital Signs.:   01-Feb-16 16:17  Vital Signs Type Q 4hr  Temperature Temperature (F) 97.1  Celsius 36.1  Temperature Source oral  Pulse Pulse 65  Respirations Respirations 18  Systolic BP Systolic BP 152  Diastolic BP (mmHg) Diastolic BP (mmHg) 65  Mean BP 94  Pulse Ox % Pulse Ox % 95  Pulse Ox Activity Level  At rest  Oxygen Delivery Room Air/ 21 %   Brief Assessment:  Cardiac Regular   Respiratory clear BS   Gastrointestinal details normal Soft  Nondistended  Bowel sounds normal  No rebound tenderness  mild tenderness in the luq, smaller area noted.   Lab Results: Routine Chem:  01-Feb-16 04:33   Magnesium, Serum 2.3 (1.8-2.4 THERAPEUTIC RANGE: 4-7 mg/dL TOXIC: > 10 mg/dL  -----------------------)   Assessment/Plan:  Assessment/Plan:  Assessment 1) n/v diarrhea. improved, abdominal pain improved.  abnormal ct with colitis, possiblly reactive to local pancreatic inflammation.  Pancreatic inflammatory mass ude.   Plan 1) for repeat ct tomorrow.  continue current.   Electronic Signatures: Barnetta ChapelSkulskie, Konstantine Gervasi (MD)  (Signed 01-Feb-16 18:35)  Authored: Chief Complaint, VITAL SIGNS/ANCILLARY NOTES, Brief Assessment, Lab Results, Assessment/Plan   Last Updated: 01-Feb-16 18:35 by Barnetta ChapelSkulskie, Lesia Monica (MD)

## 2015-02-21 NOTE — Consult Note (Signed)
Brief Consult Note: Diagnosis: abnormal ct of pancreas, abdominal pain, nausea vomiting, diarrhea.   Patient was seen by consultant.   Consult note dictated.   Recommend further assessment or treatment.   Orders entered.   Comments: Please see full GI consult [phone]#[id number].  Patient presenting with n/v and increased abdominalpain in the left upper quadrant to flank.  Similar symptoms for several months,  with weight loss.  CT showing mass in the tail of the pancreas, inflammatory versus neoplastic versus other.  Continue abx, will do ca19-9.  Further recs to follow.  Electronic Signatures: Barnetta ChapelSkulskie, Martin (MD)  (Signed 24-Jan-16 17:14)  Authored: Brief Consult Note   Last Updated: 24-Jan-16 17:14 by Barnetta ChapelSkulskie, Martin (MD)

## 2015-02-21 NOTE — Consult Note (Signed)
PATIENT NAME:  Laura LoraWOOD, Shary D MR#:  [id number] DATE OF BIRTH:  [birth year]  DATE OF CONSULTATION:  11/16/2014  CONSULTING PHYSICIAN:  Dr. Judithann SheenSparks.    REASON FOR CONSULTATION: Atrial fibrillation, hypertension, sleep apnea sinus tachycardia.   CHIEF COMPLAINT:  Diarrhea and abdominal pain.  HISTORY OF PRESENT ILLNESS:  This is an 81 year old female with known atrial fibrillation, on metoprolol relatively controlled in the recent past with essential hypertension, chronic kidney disease stage III, and abdominal aortic aneurysm with sleep apnea for which she was not treating with CPAP machine. The patient has had a recent abdominal discomfort, diarrhea, weakness, and fatigue and possible dehydration, for which appears to be a viral gastroenteritis with now new anemia. The patient has not had any apparent bloody stools, although this is being investigated. Currently, the patient does not had any heart failure-type symptoms, although has had some tachycardia with an EKG showing sinus tachycardia with frequent pre-ventricular and pre-atrial contractions, although telemetry previously has shown some atrial fibrillation, not currently documented that we see.  The patient has had normal troponin without evidence of myocardial infarction. Currently, she is feeling slightly improved with less diarrhea, but also continues to have some mild nausea. The remainder review of systems negative for vision change, ringing in the ears, positive for hearing loss, positive for weakness and fatigue. Negative for, syncope, dizziness, diaphoresis, frequent urination, urination at night, muscle weakness, numbness, anxiety, depression, skin lesions, or skin rashes.   PAST MEDICAL HISTORY:  1.  Paroxysmal nonvalvular atrial fibrillation.  2.  Essential hypertension.  3.  Anemia.  4.  Chronic kidney disease stage III.  5.  Abdominal aortic aneurysm.  6.  Sleep apnea obstructive in nature.   FAMILY HISTORY: No family members with  early onset of cardiovascular disease, but does have hypertension.   SOCIAL HISTORY: Currently denies alcohol or tobacco use.   ALLERGIES: AS LISTED.   MEDICATIONS: As listed.   PHYSICAL EXAMINATION:   VITAL SIGNS: Blood pressure is 110/68 bilaterally. Heart rate is 78 upright, reclining, and irregular.  GENERAL: She is a well-appearing elderly female in no acute distress.  HEENT: No icterus, thyromegaly, ulcers, hemorrhage, or xanthelasma.  CARDIOVASCULAR: Irregularly irregular with normal S1 and S2. No apparent murmur, gallop, or rub. PMI is diffuse. Carotid upstroke normal without bruit. Jugular venous pressures are normal.  LUNGS: Have few basilar crackles with normal respirations.  ABDOMEN: Soft with diffuse tenderness. No apparent hepatosplenomegaly or masses. Abdominal aorta is normal size with bruit.  EXTREMITIES: Showed 2+ radial, femoral, dorsal pedal pulses, with no lower extremity edema, cyanosis, clubbing or ulcers.  NEUROLOGIC: She is oriented to time, place, and person, with normal mood and affect.   ASSESSMENT: An 81 year old female with paroxysmal nonvalvular atrial fibrillation, currently in normal sinus rhythm, essential hypertension, anemia, chronic kidney disease stage III, peripheral vascular disease with abdominal aortic aneurysm and untreated sleep apnea, having a  viral gastroenteritis.   RECOMMENDATIONS:  1.  Continue to treat nonvalvular atrial fibrillation with metoprolol for heart rate control and maintenance of normal sinus rhythm.  2.  Continue essential hypertension control with metoprolol, as well and ACE inhibitor for chronic kidney disease if able.  3.  Follow anemia for concerns of bleeding and guaiac-positive stools and possible discontinuation Eliquis if patient has had any bleeding and other concerns but would recommend long-term treatment for anticoagulation of atrial fibrillation if no source of blood bleeding or significant anemia due to high [place]  score if able.  4.  Continue to  watch for sleep apnea causing atrial fibrillation and possible congestive heart failure-type symptoms due to untreated sleep apnea.  5.  Continue supportive care of viral gastroenteritis likely causing current symptoms and further treatment with antibiotics, as well as hydration.  6.  Consider echocardiogram for valvular heart disease, enlargement of or other cardiovascular issues needing further adjustments.      ____________________________ Lamar Blinks, MD bjk:at D: 11/16/2014 10:24:20 ET T: 11/16/2014 10:51:34 ET JOB#: [id number]  cc: Lamar Blinks, MD, <Dictator> Lamar Blinks MD ELECTRONICALLY SIGNED 11/20/2014 8:36

## 2015-03-17 ENCOUNTER — Other Ambulatory Visit: Payer: Self-pay | Admitting: Internal Medicine

## 2015-03-17 DIAGNOSIS — R911 Solitary pulmonary nodule: Secondary | ICD-10-CM

## 2015-03-24 ENCOUNTER — Ambulatory Visit
Admission: RE | Admit: 2015-03-24 | Discharge: 2015-03-24 | Disposition: A | Discharge status: Home / Self Care | Payer: Medicare Other | Source: Ambulatory Visit | Attending: Internal Medicine | Admitting: Internal Medicine

## 2015-03-24 DIAGNOSIS — R918 Other nonspecific abnormal finding of lung field: Secondary | ICD-10-CM | POA: Diagnosis not present

## 2015-03-24 DIAGNOSIS — I2510 Atherosclerotic heart disease of native coronary artery without angina pectoris: Secondary | ICD-10-CM | POA: Diagnosis not present

## 2015-03-24 DIAGNOSIS — R911 Solitary pulmonary nodule: Secondary | ICD-10-CM

## 2015-03-24 HISTORY — DX: Essential (primary) hypertension: I10

## 2015-03-24 HISTORY — DX: Unspecified asthma, uncomplicated: J45.909

## 2015-03-24 MED ORDER — IOHEXOL 300 MG/ML  SOLN
75.0000 mL | Freq: Once | INTRAMUSCULAR | Status: AC | PRN
  Administered 2015-03-24: 75 mL via INTRAVENOUS

## 2015-04-16 ENCOUNTER — Other Ambulatory Visit: Payer: Self-pay | Admitting: Internal Medicine

## 2015-04-16 DIAGNOSIS — S22000A Wedge compression fracture of unspecified thoracic vertebra, initial encounter for closed fracture: Secondary | ICD-10-CM

## 2015-04-19 ENCOUNTER — Other Ambulatory Visit: Payer: Self-pay | Admitting: Physical Medicine and Rehabilitation

## 2015-04-19 DIAGNOSIS — M546 Pain in thoracic spine: Secondary | ICD-10-CM

## 2015-04-20 ENCOUNTER — Ambulatory Visit: Payer: Medicare Other

## 2015-04-22 ENCOUNTER — Encounter
Admission: RE | Admit: 2015-04-22 | Discharge: 2015-04-22 | Disposition: A | Discharge status: Home / Self Care | Payer: Medicare Other | Source: Ambulatory Visit | Attending: Physical Medicine and Rehabilitation | Admitting: Physical Medicine and Rehabilitation

## 2015-04-22 DIAGNOSIS — M546 Pain in thoracic spine: Secondary | ICD-10-CM | POA: Insufficient documentation

## 2015-04-22 MED ORDER — TECHNETIUM TC 99M MEDRONATE IV KIT
25.0000 | PACK | Freq: Once | INTRAVENOUS | Status: AC | PRN
  Administered 2015-04-22: 23.34 via INTRAVENOUS

## 2015-04-29 ENCOUNTER — Encounter: Payer: Self-pay | Admitting: *Deleted

## 2015-04-29 ENCOUNTER — Encounter
Admission: RE | Disposition: A | Discharge status: Home / Self Care | Payer: Self-pay | Source: Ambulatory Visit | Attending: Orthopedic Surgery

## 2015-04-29 ENCOUNTER — Ambulatory Visit
Admission: RE | Admit: 2015-04-29 | Discharge: 2015-04-29 | Disposition: A | Discharge status: Home / Self Care | Payer: Medicare Other | Source: Ambulatory Visit | Attending: Orthopedic Surgery | Admitting: Orthopedic Surgery

## 2015-04-29 ENCOUNTER — Ambulatory Visit: Payer: Medicare Other

## 2015-04-29 ENCOUNTER — Ambulatory Visit: Payer: Medicare Other | Admitting: Anesthesiology

## 2015-04-29 DIAGNOSIS — J309 Allergic rhinitis, unspecified: Secondary | ICD-10-CM | POA: Insufficient documentation

## 2015-04-29 DIAGNOSIS — G2581 Restless legs syndrome: Secondary | ICD-10-CM | POA: Insufficient documentation

## 2015-04-29 DIAGNOSIS — M4854XA Collapsed vertebra, not elsewhere classified, thoracic region, initial encounter for fracture: Secondary | ICD-10-CM | POA: Insufficient documentation

## 2015-04-29 DIAGNOSIS — K5790 Diverticulosis of intestine, part unspecified, without perforation or abscess without bleeding: Secondary | ICD-10-CM | POA: Diagnosis not present

## 2015-04-29 DIAGNOSIS — M4806 Spinal stenosis, lumbar region: Secondary | ICD-10-CM | POA: Insufficient documentation

## 2015-04-29 DIAGNOSIS — I739 Peripheral vascular disease, unspecified: Secondary | ICD-10-CM | POA: Diagnosis not present

## 2015-04-29 DIAGNOSIS — M1990 Unspecified osteoarthritis, unspecified site: Secondary | ICD-10-CM | POA: Insufficient documentation

## 2015-04-29 DIAGNOSIS — Z8249 Family history of ischemic heart disease and other diseases of the circulatory system: Secondary | ICD-10-CM | POA: Insufficient documentation

## 2015-04-29 DIAGNOSIS — J45909 Unspecified asthma, uncomplicated: Secondary | ICD-10-CM | POA: Diagnosis not present

## 2015-04-29 DIAGNOSIS — S22000A Wedge compression fracture of unspecified thoracic vertebra, initial encounter for closed fracture: Secondary | ICD-10-CM

## 2015-04-29 DIAGNOSIS — E039 Hypothyroidism, unspecified: Secondary | ICD-10-CM | POA: Insufficient documentation

## 2015-04-29 DIAGNOSIS — R569 Unspecified convulsions: Secondary | ICD-10-CM | POA: Insufficient documentation

## 2015-04-29 DIAGNOSIS — Z803 Family history of malignant neoplasm of breast: Secondary | ICD-10-CM | POA: Insufficient documentation

## 2015-04-29 DIAGNOSIS — Z79899 Other long term (current) drug therapy: Secondary | ICD-10-CM | POA: Insufficient documentation

## 2015-04-29 DIAGNOSIS — G4730 Sleep apnea, unspecified: Secondary | ICD-10-CM | POA: Insufficient documentation

## 2015-04-29 DIAGNOSIS — I10 Essential (primary) hypertension: Secondary | ICD-10-CM | POA: Insufficient documentation

## 2015-04-29 DIAGNOSIS — Z90710 Acquired absence of both cervix and uterus: Secondary | ICD-10-CM | POA: Diagnosis not present

## 2015-04-29 DIAGNOSIS — E871 Hypo-osmolality and hyponatremia: Secondary | ICD-10-CM | POA: Diagnosis not present

## 2015-04-29 DIAGNOSIS — K2960 Other gastritis without bleeding: Secondary | ICD-10-CM | POA: Insufficient documentation

## 2015-04-29 DIAGNOSIS — Z86010 Personal history of colonic polyps: Secondary | ICD-10-CM | POA: Diagnosis not present

## 2015-04-29 DIAGNOSIS — Z833 Family history of diabetes mellitus: Secondary | ICD-10-CM | POA: Insufficient documentation

## 2015-04-29 HISTORY — DX: Unspecified convulsions: R56.9

## 2015-04-29 HISTORY — PX: KYPHOPLASTY: SHX5884

## 2015-04-29 HISTORY — DX: Unspecified osteoarthritis, unspecified site: M19.90

## 2015-04-29 SURGERY — KYPHOPLASTY
Anesthesia: Monitor Anesthesia Care

## 2015-04-29 MED ORDER — MIDAZOLAM HCL 2 MG/2ML IJ SOLN
INTRAMUSCULAR | Status: DC | PRN
  Administered 2015-04-29: .5 mg via INTRAVENOUS
  Administered 2015-04-29: 1 mg via INTRAVENOUS

## 2015-04-29 MED ORDER — CHLORHEXIDINE GLUCONATE 4 % EX LIQD: Freq: Once | CUTANEOUS | Status: DC

## 2015-04-29 MED ORDER — BUPIVACAINE-EPINEPHRINE (PF) 0.5% -1:200000 IJ SOLN
INTRAMUSCULAR | Status: AC
  Filled 2015-04-29: qty 30

## 2015-04-29 MED ORDER — LIDOCAINE HCL 1 % IJ SOLN
INTRAMUSCULAR | Status: DC | PRN
  Administered 2015-04-29 (×2): 10 mL
  Administered 2015-04-29: 15 mL

## 2015-04-29 MED ORDER — LACTATED RINGERS IV SOLN
INTRAVENOUS | Status: DC
  Administered 2015-04-29 (×2): via INTRAVENOUS

## 2015-04-29 MED ORDER — FENTANYL CITRATE (PF) 100 MCG/2ML IJ SOLN: 25.0000 ug | INTRAMUSCULAR | Status: DC | PRN

## 2015-04-29 MED ORDER — KETAMINE HCL 50 MG/ML IJ SOLN
INTRAMUSCULAR | Status: DC | PRN
  Administered 2015-04-29: 12.5 mg via INTRAMUSCULAR
  Administered 2015-04-29: 25 mg via INTRAMUSCULAR

## 2015-04-29 MED ORDER — FAMOTIDINE 20 MG PO TABS: 20.0000 mg | ORAL_TABLET | Freq: Once | ORAL | Status: DC

## 2015-04-29 MED ORDER — CLINDAMYCIN PHOSPHATE 900 MG/50ML IV SOLN
900.0000 mg | Freq: Once | INTRAVENOUS | Status: DC
Start: 2015-04-29 — End: 2015-04-29

## 2015-04-29 MED ORDER — LIDOCAINE HCL (PF) 1 % IJ SOLN
INTRAMUSCULAR | Status: AC
  Filled 2015-04-29: qty 60

## 2015-04-29 MED ORDER — FAMOTIDINE 20 MG PO TABS
ORAL_TABLET | ORAL | Status: AC
  Filled 2015-04-29: qty 1

## 2015-04-29 MED ORDER — IOHEXOL 180 MG/ML  SOLN
INTRAMUSCULAR | Status: AC
  Filled 2015-04-29: qty 40

## 2015-04-29 MED ORDER — CLINDAMYCIN PHOSPHATE 900 MG/50ML IV SOLN
INTRAVENOUS | Status: AC
  Filled 2015-04-29: qty 50

## 2015-04-29 MED ORDER — PROPOFOL INFUSION 10 MG/ML OPTIME
INTRAVENOUS | Status: DC | PRN
  Administered 2015-04-29: 35 ug/kg/min via INTRAVENOUS

## 2015-04-29 MED ORDER — BUPIVACAINE-EPINEPHRINE (PF) 0.5% -1:200000 IJ SOLN
INTRAMUSCULAR | Status: DC | PRN
  Administered 2015-04-29: 15 mL via PERINEURAL
  Administered 2015-04-29: 10 mL via PERINEURAL

## 2015-04-29 MED ORDER — ONDANSETRON HCL 4 MG/2ML IJ SOLN: 4.0000 mg | Freq: Once | INTRAMUSCULAR | Status: DC | PRN

## 2015-04-29 MED ORDER — HYDROCODONE-ACETAMINOPHEN 5-325 MG PO TABS: 1.0000 | ORAL_TABLET | Freq: Four times a day (QID) | ORAL | Status: DC | PRN

## 2015-04-29 SURGICAL SUPPLY — 18 items
CEMENT BONE KYPHON CDS (Cement) ×2 IMPLANT
CEMENT KYPHON CX01A KIT/MIXER (Cement) ×1 IMPLANT
DEVICE BIOPSY BONE KYPHX (INSTRUMENTS) ×2 IMPLANT
DRAPE C-ARM XRAY 36X54 (DRAPES) ×2 IMPLANT
DURAPREP 26ML APPLICATOR (WOUND CARE) ×2 IMPLANT
GLOVE BIO SURGEON STRL SZ7.5 (GLOVE) ×1 IMPLANT
GLOVE INDICATOR 7.5 STRL GRN (GLOVE) ×1 IMPLANT
GLOVE SURG CUT RESIS 520032000 (GLOVE) ×1 IMPLANT
GLOVE SURG ORTHO 9.0 STRL STRW (GLOVE) ×2 IMPLANT
GOWN SPECIALTY ULTRA XL (MISCELLANEOUS) ×2 IMPLANT
GOWN STRL REUS W/ TWL LRG LVL3 (GOWN DISPOSABLE) ×1 IMPLANT
GOWN STRL REUS W/TWL LRG LVL3 (GOWN DISPOSABLE) ×2
KYPHOPACK IMPLANT
LIQUID BAND (GAUZE/BANDAGES/DRESSINGS) ×2 IMPLANT
NDL SAFETY 18GX1.5 (NEEDLE) ×2 IMPLANT
PACK KYPHOPLASTY (MISCELLANEOUS) ×2 IMPLANT
STRAP SAFETY BODY (MISCELLANEOUS) ×2 IMPLANT
TRAY KYPHOPAK 15/2 EXPRESS (KITS) ×2 IMPLANT

## 2015-04-29 NOTE — Transfer of Care (Signed)
Immediate Anesthesia Transfer of Care Note  Patient: [name]  Procedure(s) Performed: Procedure(s): KYPHOPLASTY  T-12, T-10, T-7 (N/A)  Patient Location: PACU  Anesthesia Type:MAC  Level of Consciousness: sedated  Airway & Oxygen Therapy: Patient Spontanous Breathing and Patient connected to nasal cannula oxygen  Post-op Assessment: Report given to RN and Post -op Vital signs reviewed and stable  Post vital signs: Reviewed and stable  Last Vitals:  Filed Vitals:   04/29/15 1555  BP: 174/89  Pulse: 88  Temp: 36.4 C  Resp: 19    Complications: No apparent anesthesia complications

## 2015-04-29 NOTE — H&P (Signed)
Reviewed paper H+P, will be scanned into chart. No changes noted.  

## 2015-04-29 NOTE — Discharge Instructions (Addendum)
Remove Band-Aid's from back on Saturday okay to shower after that. No lifting over 5 pounds until recheck Percutaneous Vertebroplasty Percutaneous (through the skin) vertebroplasty is a procedure used to treat collapsed bones (compression fractures) of the spine. Spine (vertebral) fractures can be painful and limit movement. Percutaneous vertebroplasty stabilizes the fracture by injecting bone cement into the collapsed bone. This restores the vertebra and helps prevent further collapse.  LET [facility] CARE PROVIDER KNOW ABOUT:  Any allergies you have.  All medicines you are taking, including vitamins, herbs, eye drops, creams, and over-the-counter medicines.  Previous problems you or members of your family have had with the use of anesthetics.  Any blood disorders you have.  Previous surgeries you have had. RISKS AND COMPLICATIONS Generally, this is a safe procedure. However, as with any procedure, complications can occur. Possible complications include:  Bone cement leakage.  Nerve damage.  Infection.  Need for another surgery.   Paralysis (very rare). Some people are at higher risk than others for this complication to occur. Your particular risks should be discussed with your health care provider. BEFORE THE PROCEDURE  Your health care provider may want you to have blood tests. These tests can help tell how well your kidneys and liver are working. They can also show how well your blood clots.   You may be asked to stop using medicines that make it hard for your blood to clot. These can include blood thinners, aspirin, and nonsteroidal anti-inflammatory drugs (NSAIDs) like ibuprofen and naproxen. You may also need to stop taking vitamin E. Your health care provider will tell you when to stop taking these medicines, and when it is safe to start taking them again.  You may need to take medicine to help make your bones stronger. This will help prepare you for the procedure.  Do  not eat or drink for 8 hours before your procedure or as told by your health care provider.  You might be asked to shower or wash at home with a soap that kills skin bacteria.  Make arrangements for someone to drive you home and stay with you for 24 hours. PROCEDURE  An IV tube will be inserted into one of your veins. Medicine to help you relax (sedative) will be given through the IV tube.  You will lie face down for the procedure.  Medicine that numbs the area (local anesthetic) will be injected into the skin right above the fractured vertebra. A small cut (incision) is then made in that same area.  A hollow needle is inserted through the incision. An X-ray machine (fluoroscope) is used to guide the needle to the fractured vertebrae.  Bone cement is put through the hollow needle into the fractured vertebra. It hardens in about 20 minutes.  A bandage (dressing) is put over the incision site. AFTER THE PROCEDURE  You will stay in a recovery area until you are awake enough to eat and drink.  You will be checked to make sure you can get out of bed and walk around comfortably.  Some people find that pain relief is immediate. Others may notice pain going away within 2 days of the procedure. Document Released: 06/07/2011 Document Revised: 10/14/2013 Document Reviewed: 06/16/2013 [facility] Patient Information 2015 [place], [place]. This information is not intended to replace advice given to you by your health care provider. Make sure you discuss any questions you have with your health care provider. AMBULATORY SURGERY  DISCHARGE INSTRUCTIONS   1) The drugs that you were  given will stay in your system until tomorrow so for the next 24 hours you should not:  A) Drive an automobile B) Make any legal decisions C) Drink any alcoholic beverage   2) You may resume regular meals tomorrow.  Today it is better to start with liquids and gradually work up to solid foods.  You may eat anything you  prefer, but it is better to start with liquids, then soup and crackers, and gradually work up to solid foods.   3) Please notify your doctor immediately if you have any unusual bleeding, trouble breathing, redness and pain at the surgery site, drainage, fever, or pain not relieved by medication.

## 2015-04-29 NOTE — Anesthesia Procedure Notes (Signed)
Date/Time: 04/29/2015 2:30 PM Performed by: Junious SilkNOLES, Caileen Veracruz Pre-anesthesia Checklist: Patient identified, Emergency Drugs available, Suction available, Patient being monitored and Timeout performed Oxygen Delivery Method: Nasal cannula

## 2015-04-29 NOTE — Op Note (Signed)
04/29/2015  3:56 PM  PATIENT:  [name]  81 y.o. female  PRE-OPERATIVE DIAGNOSIS:  COMPRESSION FRACTURE T-7, T-10, T-12  POST-OPERATIVE DIAGNOSIS:  compression fracture T-10, T-12,T-7  PROCEDURE:  Procedure(s): KYPHOPLASTY  T-12, T-10, T-7 (N/A)  SURGEON: Leitha SchullerMichael J Kamarah Bilotta, MD  ASSISTANTS: None  ANESTHESIA:   MAC  EBL:  Total I/O In: 900 [I.V.:900] Out: -   BLOOD ADMINISTERED:none  DRAINS: none   LOCAL MEDICATIONS USED:  MARCAINE    and XYLOCAINE   SPECIMEN:  Source of Specimen:  T7 and T12 vertebral body biopsies, no material obtained at T10  DISPOSITION OF SPECIMEN:  PATHOLOGY  COUNTS:  YES  TOURNIQUET:  * No tourniquets in log *  IMPLANTS: Bone cement  DICTATION: .Dragon Dictation patient brought the operating room and after adequate sedation was given the patient was placed prone. C-arm was brought in and good visualization of the affected levels was obtained. After appropriate patient education timeout procedure skin was prepped with alcohol and Xylocaine was infiltrated subcutaneously for initial local. Next the back was prepped and draped in sterile fashion and repeat timeout procedure performed. The T 10 was approached from the left side T rather T12 from the left side T10 from the right side with spinal needle placed onto the pedicle and a mixture of half percent Sensorcaine with epinephrine and 1% Xylocaine infiltrated along the tract down to the pedicle. Small incisions were made and trochars advanced to the and extrapedicular manner to get into the vertebral body and crossed the midline so second stick was not required. Biopsy obtained at T12 no material obtained at T10. Going was carried out followed by inflation of balloon in both approximate 2-1/2 cc. Cement was mixed and when it was the appropriate consistency both vertebral bodies were filled and left and right sides both had cement The trochars removed and attention was then turned to T7 radial compression  procedures carried out on the right side as previously described. Biopsy was obtained there is good fill of T7 7 after second mixture of cement permanent C-arm views were obtained the wounds were closed with Dermabond. Patient center comes stable condition  PLAN OF CARE: Discharge to home after PACU  PATIENT DISPOSITION:  PACU - hemodynamically stable.

## 2015-04-29 NOTE — Anesthesia Preprocedure Evaluation (Signed)
Anesthesia Evaluation  Patient identified by MRN, date of birth, ID band Patient awake    Reviewed: Allergy & Precautions, NPO status , Patient's Chart, lab work & pertinent test results, reviewed documented beta blocker date and time   Airway Mallampati: II  TM Distance: >3 FB     Dental  (+) Chipped   Pulmonary asthma ,          Cardiovascular hypertension,     Neuro/Psych Seizures -,     GI/Hepatic   Endo/Other    Renal/GU      Musculoskeletal  (+) Arthritis -,   Abdominal   Peds  Hematology   Anesthesia Other Findings   Reproductive/Obstetrics                             Anesthesia Physical Anesthesia Plan  ASA: II  Anesthesia Plan: MAC   Post-op Pain Management:    Induction:   Airway Management Planned: Nasal Cannula  Additional Equipment:   Intra-op Plan:   Post-operative Plan:   Informed Consent: I have reviewed the patients History and Physical, chart, labs and discussed the procedure including the risks, benefits and alternatives for the proposed anesthesia with the patient or authorized representative who has indicated his/her understanding and acceptance.     Plan Discussed with: CRNA  Anesthesia Plan Comments:         Anesthesia Quick Evaluation

## 2015-04-30 ENCOUNTER — Encounter: Payer: Self-pay | Admitting: Orthopedic Surgery

## 2015-05-01 NOTE — Anesthesia Postprocedure Evaluation (Signed)
  Anesthesia Post-op Note  Patient: [name]  Procedure(s) Performed: Procedure(s): KYPHOPLASTY  T-12, T-10, T-7 (N/A)  Anesthesia type:MAC  Patient location: PACU  Post pain: Pain level controlled  Post assessment: Post-op Vital signs reviewed, Patient's Cardiovascular Status Stable, Respiratory Function Stable, Patent Airway and No signs of Nausea or vomiting  Post vital signs: Reviewed and stable  Last Vitals:  Filed Vitals:   04/29/15 1700  BP: 148/82  Pulse: 66  Temp: 35.7 C  Resp: 16    Level of consciousness: awake, alert  and patient cooperative  Complications: No apparent anesthesia complications

## 2015-05-03 LAB — SURGICAL PATHOLOGY

## 2015-07-07 ENCOUNTER — Other Ambulatory Visit: Payer: Self-pay | Admitting: Internal Medicine

## 2015-07-07 DIAGNOSIS — Z1231 Encounter for screening mammogram for malignant neoplasm of breast: Secondary | ICD-10-CM

## 2015-07-14 ENCOUNTER — Ambulatory Visit: Payer: Medicare Other

## 2015-07-21 ENCOUNTER — Ambulatory Visit
Admission: RE | Admit: 2015-07-21 | Discharge: 2015-07-21 | Disposition: A | Discharge status: Home / Self Care | Payer: Medicare Other | Source: Ambulatory Visit | Attending: Internal Medicine | Admitting: Internal Medicine

## 2015-07-21 ENCOUNTER — Other Ambulatory Visit: Payer: Self-pay | Admitting: Internal Medicine

## 2015-07-21 DIAGNOSIS — Z1231 Encounter for screening mammogram for malignant neoplasm of breast: Secondary | ICD-10-CM

## 2015-07-26 ENCOUNTER — Emergency Department
Admission: EM | Admit: 2015-07-26 | Discharge: 2015-07-26 | Disposition: A | Discharge status: Home / Self Care | Payer: Medicare Other | Attending: Emergency Medicine | Admitting: Emergency Medicine

## 2015-07-26 ENCOUNTER — Emergency Department: Payer: Medicare Other

## 2015-07-26 ENCOUNTER — Other Ambulatory Visit: Payer: Self-pay

## 2015-07-26 DIAGNOSIS — I10 Essential (primary) hypertension: Secondary | ICD-10-CM | POA: Insufficient documentation

## 2015-07-26 DIAGNOSIS — Z880 Allergy status to penicillin: Secondary | ICD-10-CM | POA: Diagnosis not present

## 2015-07-26 DIAGNOSIS — Z7952 Long term (current) use of systemic steroids: Secondary | ICD-10-CM | POA: Insufficient documentation

## 2015-07-26 DIAGNOSIS — F419 Anxiety disorder, unspecified: Secondary | ICD-10-CM | POA: Diagnosis not present

## 2015-07-26 DIAGNOSIS — Z79899 Other long term (current) drug therapy: Secondary | ICD-10-CM | POA: Diagnosis not present

## 2015-07-26 DIAGNOSIS — R531 Weakness: Secondary | ICD-10-CM | POA: Diagnosis present

## 2015-07-26 LAB — COMPREHENSIVE METABOLIC PANEL WITH GFR
ALT: 26 U/L (ref 14–54)
AST: 21 U/L (ref 15–41)
Albumin: 3.8 g/dL (ref 3.5–5.0)
Alkaline Phosphatase: 68 U/L (ref 38–126)
Anion gap: 7 (ref 5–15)
BUN: 18 mg/dL (ref 6–20)
CO2: 31 mmol/L (ref 22–32)
Calcium: 9.9 mg/dL (ref 8.9–10.3)
Chloride: 95 mmol/L — ABNORMAL LOW (ref 101–111)
Creatinine, Ser: 0.57 mg/dL (ref 0.44–1.00)
GFR calc Af Amer: >60 mL/min
GFR calc non Af Amer: >60 mL/min
Glucose, Bld: 95 mg/dL (ref 65–99)
Potassium: 4.7 mmol/L (ref 3.5–5.1)
Sodium: 133 mmol/L — ABNORMAL LOW (ref 135–145)
Total Bilirubin: 0.5 mg/dL (ref 0.3–1.2)
Total Protein: 6.9 g/dL (ref 6.5–8.1)

## 2015-07-26 LAB — CBC
HEMATOCRIT: 42.8 % (ref 35.0–47.0)
Hemoglobin: 13.7 g/dL (ref 12.0–16.0)
MCH: 29.6 pg (ref 26.0–34.0)
MCHC: 32.1 g/dL (ref 32.0–36.0)
MCV: 92.1 fL (ref 80.0–100.0)
PLATELETS: 314 10*3/uL (ref 150–440)
RBC: 4.65 MIL/uL (ref 3.80–5.20)
RDW: 14.6 % — AB (ref 11.5–14.5)
WBC: 8.2 10*3/uL (ref 3.6–11.0)

## 2015-07-26 LAB — TROPONIN I: Troponin I: <0.03 ng/mL

## 2015-07-26 NOTE — ED Notes (Signed)
Patient transported to X-ray 

## 2015-07-26 NOTE — ED Notes (Signed)
Report called to [place] at [facility]

## 2015-07-26 NOTE — ED Notes (Signed)
Pt up to the commode with assistance.

## 2015-07-26 NOTE — ED Notes (Signed)
Pt sitting up eating a sandwich meal,.awaiting [facility] to [facility] via [facility].Marland Kitchen

## 2015-07-26 NOTE — ED Notes (Signed)
Pt comes into the ED via EMS from [facility] with c/o not feeling well, weakness, left arm pain, SOB, HTN , night sweats for the past couple of days.

## 2015-07-26 NOTE — Discharge Instructions (Signed)

## 2015-07-26 NOTE — ED Provider Notes (Signed)
[facility] Emergency Department Provider Note  ____________________________________________  Time seen: On arrival, via EMS  I have reviewed the triage vital signs and the nursing notes.   HISTORY  Chief Complaint Weakness    HPI [name] is a 81 y.o. female who presents with complaints of high blood pressure. She also complains of some left arm pain which started just prior to arrival but her blood pressure was checked last night by a CNA and it was high and then they checked it throughout the night at home place and it did not get better so she decided to come to the hospital. She does complain of a mild headache. She has had night sweats for approximately 2 weeks which she is working on with her PCP. She denies chest pain to me.     Past Medical History  Diagnosis Date  . Asthma   . Hypertension   . Arthritis   . Seizures (HCC)     There are no active problems to display for this patient.   Past Surgical History  Procedure Laterality Date  . Joint replacement Left   . Cardiac catheterization    . Back surgery    . Kyphoplasty N/A 04/29/2015    Procedure: KYPHOPLASTY  T-12, T-10, T-7;  Surgeon: Kennedy Bucker, MD;  Location: [facility] ORS;  Service: Orthopedics;  Laterality: N/A;    Current Outpatient Rx  Name  Route  Sig  Dispense  Refill  . acetaminophen (TYLENOL) 325 MG tablet   Oral   Take 650 mg by mouth every 4 (four) hours as needed for mild pain.         . Benzonatate (TESSALON PERLES PO)   Oral   Take 1 capsule by mouth 3 (three) times daily as needed (for cough).         . Biotin 1000 MCG tablet   Oral   Take 1,000 mcg by mouth once.         . Cholecalciferol (VITAMIN D) 2000 UNITS tablet   Oral   Take 2,000 Units by mouth daily.         . hydrALAZINE (APRESOLINE) 25 MG tablet   Oral   Take 25 mg by mouth 2 (two) times daily.         Marland Kitchen HYDROcodone-acetaminophen (NORCO) 5-325 MG per tablet   Oral   Take 1  tablet by mouth every 6 (six) hours as needed for moderate pain.   30 tablet   0   . HYDROcodone-acetaminophen (NORCO/VICODIN) 5-325 MG per tablet   Oral   Take 1 tablet by mouth 3 (three) times daily.         . hydrocortisone (CORTEF) 10 MG tablet   Oral   Take 10 mg by mouth 2 (two) times daily.         . Iron-Vitamin C (VITRON-C) 65-125 MG TABS   Oral   Take 1 tablet by mouth 3 (three) times a week. Take Monday - Wednesday - Friday         . levothyroxine (SYNTHROID, LEVOTHROID) 125 MCG tablet   Oral   Take 125 mcg by mouth daily before breakfast.         . magnesium hydroxide (MILK OF MAGNESIA) 400 MG/5ML suspension   Oral   Take 45 mLs by mouth daily as needed for mild constipation.         . magnesium oxide (MAG-OX) 400 MG tablet   Oral   Take 400 mg  by mouth daily.         . metoprolol (LOPRESSOR) 100 MG tablet   Oral   Take 100 mg by mouth 2 (two) times daily.         . montelukast (SINGULAIR) 10 MG tablet   Oral   Take 10 mg by mouth at bedtime.         Marland Kitchen olmesartan (BENICAR) 20 MG tablet   Oral   Take 40 mg by mouth daily. Take two tablets by mouth every day (total of 40 mg)         . ondansetron (ZOFRAN-ODT) 4 MG disintegrating tablet   Oral   Take 4 mg by mouth once. Take sublingual as needed for nausea         . pantoprazole (PROTONIX) 40 MG tablet   Oral   Take 40 mg by mouth daily.         . polyethylene glycol (MIRALAX / GLYCOLAX) packet   Oral   Take 17 g by mouth daily. Drink with water         . pramipexole (MIRAPEX) 0.25 MG tablet   Oral   Take 0.25 mg by mouth 3 (three) times daily.           Allergies Strawberry; Adhesive; Ciprofloxacin; Codeine; Furosemide; Macrodantin; Penicillins; Sulfa antibiotics; Valium; and Ziac  Family History  Problem Relation Age of Onset  . Breast cancer Mother   . Brain cancer Father     Social History Social History  Substance Use Topics  . Smoking status: None  .  Smokeless tobacco: None  . Alcohol Use: None    Review of Systems  Constitutional: Negative for fever. Positive for sweats Eyes: Negative for visual changes. ENT: Negative for sore throat Cardiovascular: Negative for chest pain. Respiratory: Negative for shortness of breath. Gastrointestinal: Negative for abdominal pain, vomiting and diarrhea. Genitourinary: Negative for dysuria. Musculoskeletal: Negative for back pain. Skin: Negative for rash. Neurological: Positive for headache, negative for focal weakness Psychiatric: Mild anxiety    ____________________________________________   PHYSICAL EXAM:  VITAL SIGNS: ED Triage Vitals  Enc Vitals Group     BP 07/26/15 0804 214/104 mmHg     Pulse Rate 07/26/15 0804 78     Resp 07/26/15 0804 18     Temp 07/26/15 0804 98.6 F (37 C)     Temp Source 07/26/15 0804 Oral     SpO2 07/26/15 0804 98 %     Weight 07/26/15 0804 150 lb (68.04 kg)     Height 07/26/15 0804  (1.575 m)     Head Cir --      Peak Flow --      Pain Score 07/26/15 0805 5     Pain Loc --      Pain Edu? --      Excl. in GC? --      Constitutional: Alert and oriented. Well appearing and in no distress. Eyes: Conjunctivae are normal.  ENT   Head: Normocephalic and atraumatic.   Mouth/Throat: Mucous membranes are moist. Cardiovascular: Normal rate, regular rhythm. Normal and symmetric distal pulses are present in all extremities. No murmurs, rubs, or gallops. Respiratory: Normal respiratory effort without tachypnea nor retractions. Breath sounds are clear and equal bilaterally.  Gastrointestinal: Soft and non-tender in all quadrants. No distention. There is no CVA tenderness. Genitourinary: deferred Musculoskeletal: Nontender with normal range of motion in all extremities. No lower extremity tenderness nor edema. Neurologic:  Normal speech and language. No gross focal  neurologic deficits are appreciated. Skin:  Skin is warm, dry and intact. No rash  noted. Psychiatric: Mood and affect are normal. Patient exhibits appropriate insight and judgment.  ____________________________________________    LABS (pertinent positives/negatives)  Labs Reviewed  CBC  COMPREHENSIVE METABOLIC PANEL  TROPONIN I    ____________________________________________   EKG  ED ECG REPORT I, Jene Every, the attending physician, personally viewed and interpreted this ECG.   Date: 07/26/2015  EKG Time: 8:07 AM  Rate: 81  Rhythm: normal sinus rhythm  Axis: Normal  Intervals:none  ST&T Change: None   ____________________________________________    RADIOLOGY I have personally reviewed any xrays that were ordered on this patient: Chest x-ray with elevated right hemidiaphragm  ____________________________________________   PROCEDURES  Procedure(s) performed: none  Critical Care performed: none  ____________________________________________   INITIAL IMPRESSION / ASSESSMENT AND PLAN / ED COURSE  Pertinent labs & imaging results that were available during my care of the patient were reviewed by me and considered in my medical decision making (see chart for details).  Patient is overall well-appearing although her blood pressure is elevated. We will check labs, EKG chest x-ray and consider IV antihypertensives.  ____________________________________________   FINAL CLINICAL IMPRESSION(S) / ED DIAGNOSES  Final diagnoses:  Essential hypertension     Jene Every, MD 07/26/15 1513

## 2015-08-19 ENCOUNTER — Emergency Department: Payer: Medicare Other

## 2015-08-19 ENCOUNTER — Encounter: Payer: Self-pay | Admitting: Emergency Medicine

## 2015-08-19 ENCOUNTER — Emergency Department
Admission: EM | Admit: 2015-08-19 | Discharge: 2015-08-19 | Disposition: A | Discharge status: Home / Self Care | Payer: Medicare Other | Attending: Emergency Medicine | Admitting: Emergency Medicine

## 2015-08-19 DIAGNOSIS — J45901 Unspecified asthma with (acute) exacerbation: Secondary | ICD-10-CM | POA: Diagnosis not present

## 2015-08-19 DIAGNOSIS — I10 Essential (primary) hypertension: Secondary | ICD-10-CM | POA: Insufficient documentation

## 2015-08-19 DIAGNOSIS — R109 Unspecified abdominal pain: Secondary | ICD-10-CM | POA: Diagnosis not present

## 2015-08-19 DIAGNOSIS — Z880 Allergy status to penicillin: Secondary | ICD-10-CM | POA: Diagnosis not present

## 2015-08-19 DIAGNOSIS — E871 Hypo-osmolality and hyponatremia: Secondary | ICD-10-CM

## 2015-08-19 DIAGNOSIS — R1111 Vomiting without nausea: Secondary | ICD-10-CM | POA: Diagnosis not present

## 2015-08-19 DIAGNOSIS — R110 Nausea: Secondary | ICD-10-CM

## 2015-08-19 LAB — COMPREHENSIVE METABOLIC PANEL
ALT: 27 U/L (ref 14–54)
AST: 24 U/L (ref 15–41)
Albumin: 3.8 g/dL (ref 3.5–5.0)
Alkaline Phosphatase: 56 U/L (ref 38–126)
Anion gap: 7 (ref 5–15)
BUN: 13 mg/dL (ref 6–20)
CHLORIDE: 93 mmol/L — AB (ref 101–111)
CO2: 28 mmol/L (ref 22–32)
CREATININE: 0.56 mg/dL (ref 0.44–1.00)
Calcium: 9.6 mg/dL (ref 8.9–10.3)
GFR calc non Af Amer: >60 mL/min (ref >60)
Glucose, Bld: 115 mg/dL — ABNORMAL HIGH (ref 65–99)
POTASSIUM: 4.4 mmol/L (ref 3.5–5.1)
Sodium: 128 mmol/L — ABNORMAL LOW (ref 135–145)
TOTAL PROTEIN: 6.7 g/dL (ref 6.5–8.1)
Total Bilirubin: 0.2 mg/dL — ABNORMAL LOW (ref 0.3–1.2)

## 2015-08-19 LAB — BLOOD GAS, VENOUS
Acid-Base Excess: 3.1 mmol/L — ABNORMAL HIGH (ref 0.0–3.0)
Bicarbonate: 30.2 mEq/L — ABNORMAL HIGH (ref 21.0–28.0)
FIO2: 21
PATIENT TEMPERATURE: 37
PCO2 VEN: 56 mmHg (ref 44.0–60.0)
PH VEN: 7.34 (ref 7.320–7.430)

## 2015-08-19 LAB — CBC WITH DIFFERENTIAL/PLATELET
Basophils Absolute: 0 10*3/uL (ref 0–0.1)
Basophils Relative: 0 %
EOS ABS: 0 10*3/uL (ref 0–0.7)
Eosinophils Relative: 0 %
HEMATOCRIT: 39.4 % (ref 35.0–47.0)
Hemoglobin: 13.1 g/dL (ref 12.0–16.0)
Lymphocytes Relative: 9 %
Lymphs Abs: 0.9 10*3/uL — ABNORMAL LOW (ref 1.0–3.6)
MCH: 30.7 pg (ref 26.0–34.0)
MCHC: 33.2 g/dL (ref 32.0–36.0)
MCV: 92.6 fL (ref 80.0–100.0)
MONO ABS: 0.7 10*3/uL (ref 0.2–0.9)
MONOS PCT: 7 %
Neutro Abs: 8.7 10*3/uL — ABNORMAL HIGH (ref 1.4–6.5)
Neutrophils Relative %: 84 %
PLATELETS: 335 10*3/uL (ref 150–440)
RBC: 4.25 MIL/uL (ref 3.80–5.20)
RDW: 14.6 % — AB (ref 11.5–14.5)
WBC: 10.3 10*3/uL (ref 3.6–11.0)

## 2015-08-19 LAB — LIPASE, BLOOD: Lipase: 28 U/L (ref 11–51)

## 2015-08-19 LAB — TROPONIN I

## 2015-08-19 MED ORDER — ONDANSETRON HCL 4 MG/2ML IJ SOLN
4.0000 mg | Freq: Once | INTRAMUSCULAR | Status: AC
  Administered 2015-08-19: 4 mg via INTRAVENOUS
  Filled 2015-08-19: qty 2

## 2015-08-19 MED ORDER — ONDANSETRON 4 MG PO TBDP
4.0000 mg | ORAL_TABLET | Freq: Once | ORAL | Status: AC
  Administered 2015-08-19: 4 mg via ORAL
  Filled 2015-08-19: qty 1

## 2015-08-19 MED ORDER — SODIUM CHLORIDE 0.9 % IV SOLN
1000.0000 mL | Freq: Once | INTRAVENOUS | Status: AC
  Administered 2015-08-19: 1000 mL via INTRAVENOUS

## 2015-08-19 NOTE — ED Notes (Signed)
Pt from [facility] via ems c/o n/v and abdominal pain. Pt satting at 91% on RA, so pt placed on 2 L oxygen.

## 2015-08-19 NOTE — Discharge Instructions (Signed)
Hyponatremia Hyponatremia is when the amount of salt (sodium) in your blood is too low. When salt levels are low, your cells absorb extra water and they swell. The swelling happens throughout the body, but it mostly affects the brain.  HOME CARE  Take medicines only as told by your doctor. Many medicines can make this condition worse. Talk with your doctor about any medicines that you are currently taking.  Carefully follow a recommended diet as told by your doctor.  Carefully follow instructions from your doctor about fluid restrictions.  Keep all follow-up visits as told by your doctor. This is important.  Do not drink alcohol. GET HELP IF:  You feel sicker to your stomach (nauseous).  You feel more confused.  You feel more tired (fatigued).  Your headache gets worse.  You feel weaker.  Your symptoms go away and then they come back.  You have trouble following the diet instructions. GET HELP RIGHT AWAY IF:  You start to twitch and shake (have a seizure).  You pass out (faint).  You keep having watery poop (diarrhea).  You keep throwing up (vomiting).   This information is not intended to replace advice given to you by your health care provider. Make sure you discuss any questions you have with your health care provider.   Document Released: 06/21/2011 Document Revised: 02/23/2015 Document Reviewed: 10/05/2014 Elsevier Interactive Patient Education 2016 Elsevier Inc.  Nausea and Vomiting Nausea is a sick feeling that often comes before throwing up (vomiting). Vomiting is a reflex where stomach contents come out of your mouth. Vomiting can cause severe loss of body fluids (dehydration). Children and elderly adults can become dehydrated quickly, especially if they also have diarrhea. Nausea and vomiting are symptoms of a condition or disease. It is important to find the cause of your symptoms. CAUSES   Direct irritation of the stomach lining. This irritation can result  from increased acid production (gastroesophageal reflux disease), infection, food poisoning, taking certain medicines (such as nonsteroidal anti-inflammatory drugs), alcohol use, or tobacco use.  Signals from the brain.These signals could be caused by a headache, heat exposure, an inner ear disturbance, increased pressure in the brain from injury, infection, a tumor, or a concussion, pain, emotional stimulus, or metabolic problems.  An obstruction in the gastrointestinal tract (bowel obstruction).  Illnesses such as diabetes, hepatitis, gallbladder problems, appendicitis, kidney problems, cancer, sepsis, atypical symptoms of a heart attack, or eating disorders.  Medical treatments such as chemotherapy and radiation.  Receiving medicine that makes you sleep (general anesthetic) during surgery. DIAGNOSIS Your caregiver may ask for tests to be done if the problems do not improve after a few days. Tests may also be done if symptoms are severe or if the reason for the nausea and vomiting is not clear. Tests may include:  Urine tests.  Blood tests.  Stool tests.  Cultures (to look for evidence of infection).  X-rays or other imaging studies. Test results can help your caregiver make decisions about treatment or the need for additional tests. TREATMENT You need to stay well hydrated. Drink frequently but in small amounts.You may wish to drink water, sports drinks, clear broth, or eat frozen ice pops or gelatin dessert to help stay hydrated.When you eat, eating slowly may help prevent nausea.There are also some antinausea medicines that may help prevent nausea. HOME CARE INSTRUCTIONS   Take all medicine as directed by your caregiver.  If you do not have an appetite, do not force yourself to eat. However, you  must continue to drink fluids.  If you have an appetite, eat a normal diet unless your caregiver tells you differently.  Eat a variety of complex carbohydrates (rice, wheat,  potatoes, bread), lean meats, yogurt, fruits, and vegetables.  Avoid high-fat foods because they are more difficult to digest.  Drink enough water and fluids to keep your urine clear or pale yellow.  If you are dehydrated, ask your caregiver for specific rehydration instructions. Signs of dehydration may include:  Severe thirst.  Dry lips and mouth.  Dizziness.  Dark urine.  Decreasing urine frequency and amount.  Confusion.  Rapid breathing or pulse. SEEK IMMEDIATE MEDICAL CARE IF:   You have blood or brown flecks (like coffee grounds) in your vomit.  You have black or bloody stools.  You have a severe headache or stiff neck.  You are confused.  You have severe abdominal pain.  You have chest pain or trouble breathing.  You do not urinate at least once every 8 hours.  You develop cold or clammy skin.  You continue to vomit for longer than 24 to 48 hours.  You have a fever. MAKE SURE YOU:   Understand these instructions.  Will watch your condition.  Will get help right away if you are not doing well or get worse.   This information is not intended to replace advice given to you by your health care provider. Make sure you discuss any questions you have with your health care provider.   Document Released: 10/09/2005 Document Revised: 01/01/2012 Document Reviewed: 03/08/2011 Elsevier Interactive Patient Education [employer]2016 Elsevier Inc.

## 2015-08-19 NOTE — ED Notes (Signed)
Pt returned from xray

## 2015-08-19 NOTE — ED Notes (Signed)
Patient transported to CT 

## 2015-08-19 NOTE — ED Notes (Signed)
Pt discharged home after verbalizing understanding of discharge instructions; nad noted. 

## 2015-08-19 NOTE — ED Provider Notes (Signed)
[facility] Emergency Department Provider Note     Time seen: ----------------------------------------- 12:16 PM on 08/19/2015 -----------------------------------------    I have reviewed the triage vital signs and the nursing notes.   HISTORY  Chief Complaint Abdominal Pain and Nausea    HPI [name] is a 81 y.o. female who presents ER with nausea vomiting and abdominal pain. Patient reports a history of irritable bowel syndrome and abdominal pain is consistent with prior. Patient has oxygen saturations 91% on room air, placed on 2 L of oxygen here. Patient reports persistent nausea and vomiting since she started her second salt tablet for hyponatremia. Nothing makes her symptoms better or worse.   Past Medical History  Diagnosis Date  . Asthma   . Hypertension   . Arthritis   . Seizures (HCC)     There are no active problems to display for this patient.   Past Surgical History  Procedure Laterality Date  . Joint replacement Left   . Cardiac catheterization    . Back surgery    . Kyphoplasty N/A 04/29/2015    Procedure: KYPHOPLASTY  T-12, T-10, T-7;  Surgeon: Kennedy BuckerMichael Menz, MD;  Location: [facility] ORS;  Service: Orthopedics;  Laterality: N/A;    Allergies Strawberry extract; Adhesive; Ciprofloxacin; Codeine; Furosemide; Macrodantin; Penicillins; Sulfa antibiotics; Valium; and Ziac  Social History Social History  Substance Use Topics  . Smoking status: Never Smoker   . Smokeless tobacco: Never Used  . Alcohol Use: No    Review of Systems Constitutional: Negative for fever. Eyes: Negative for visual changes. ENT: Negative for sore throat. Cardiovascular: Negative for chest pain. Respiratory: Positive for shortness of breath Gastrointestinal: Positive for abdominal pain and vomiting Genitourinary: Negative for dysuria. Musculoskeletal: Negative for back pain. Skin: Negative for rash. Neurological: Negative for headaches, focal  weakness or numbness.  10-point ROS otherwise negative.  ____________________________________________   PHYSICAL EXAM:  VITAL SIGNS: ED Triage Vitals  Enc Vitals Group     BP 08/19/15 1204 158/76 mmHg     Pulse Rate 08/19/15 1204 74     Resp 08/19/15 1204 31     Temp 08/19/15 1204 99.1 F (37.3 C)     Temp Source 08/19/15 1204 Oral     SpO2 08/19/15 1204 99 %     Weight 08/19/15 1204 160 lb (72.576 kg)     Height 08/19/15 1204 5\' 2"  (1.575 m)     Head Cir --      Peak Flow --      Pain Score 08/19/15 1207 3     Pain Loc --      Pain Edu? --      Excl. in GC? --     Constitutional: Alert and oriented. No acute distress Eyes: Conjunctivae are normal. PERRL. Normal extraocular movements. ENT   Head: Normocephalic and atraumatic.   Nose: No congestion/rhinnorhea.   Mouth/Throat: Mucous membranes are moist.   Neck: No stridor. Cardiovascular: Normal rate, regular rhythm. Normal and symmetric distal pulses are present in all extremities. No murmurs, rubs, or gallops. Respiratory: Mild rhonchi bilaterally. No tachypnea. Gastrointestinal: Abdomen is distended, non-focally tender. Normal bowel sounds. Musculoskeletal: Nontender with normal range of motion in all extremities. No joint effusions.  No lower extremity tenderness nor edema. Neurologic:  Normal speech and language. No gross focal neurologic deficits are appreciated. Speech is normal. No gait instability. Skin:  Skin is warm, dry and intact. No rash noted. Psychiatric: Mood and affect are normal. Speech and behavior are  normal. Patient exhibits appropriate insight and judgment. ____________________________________________  EKG: Interpreted by me. Normal sinus rhythm rate 77 bpm, PVCs, PR intervals normal, Q's with normal, QT intervals normal.  ____________________________________________  ED COURSE:  Pertinent labs & imaging results that were available during my care of the patient were reviewed by me  and considered in my medical decision making (see chart for details). Patient with nonspecific pain and dyspnea.  ____________________________________________    LABS (pertinent positives/negatives)  Labs Reviewed  CBC WITH DIFFERENTIAL/PLATELET - Abnormal; Notable for the following:    RDW 14.6 (*)    Neutro Abs 8.7 (*)    Lymphs Abs 0.9 (*)    All other components within normal limits  COMPREHENSIVE METABOLIC PANEL - Abnormal; Notable for the following:    Sodium 128 (*)    Chloride 93 (*)    Glucose, Bld 115 (*)    Total Bilirubin 0.2 (*)    All other components within normal limits  BLOOD GAS, VENOUS - Abnormal; Notable for the following:    Bicarbonate 30.2 (*)    Acid-Base Excess 3.1 (*)    All other components within normal limits  LIPASE, BLOOD  TROPONIN I    RADIOLOGY Images were viewed by me  Acute abdominal series IMPRESSION: No evidence of small-bowel obstruction.  Low lung volumes with chronic elevation of the right hemidiaphragm.  Stable enlargement of the cardiac silhouette. ____________________________________________  FINAL ASSESSMENT AND PLAN  Abdominal pain, dyspnea  Plan: Patient with labs and imaging as dictated above. Patient is in no acute distress, labs are grossly stable for her. She is likely dealing with IBS in addition to her chronic hyponatremia. I think she is gotten sick from the salt tablets she's been taking. She stable for outpatient follow-up.   Emily Filbert, MD   Emily Filbert, MD 08/19/15 [phone]

## 2015-08-19 NOTE — ED Notes (Signed)
Pt transported to xray 

## 2015-08-19 NOTE — ED Notes (Signed)
Report called to [facility]renda at [facility]

## 2015-08-20 ENCOUNTER — Emergency Department: Payer: Medicare Other

## 2015-08-20 ENCOUNTER — Emergency Department
Admission: EM | Admit: 2015-08-20 | Discharge: 2015-08-21 | Disposition: A | Discharge status: Home / Self Care | Payer: Medicare Other | Source: Home / Self Care | Attending: Emergency Medicine | Admitting: Emergency Medicine

## 2015-08-20 DIAGNOSIS — R1084 Generalized abdominal pain: Secondary | ICD-10-CM | POA: Insufficient documentation

## 2015-08-20 DIAGNOSIS — E222 Syndrome of inappropriate secretion of antidiuretic hormone: Secondary | ICD-10-CM | POA: Diagnosis not present

## 2015-08-20 DIAGNOSIS — Z79899 Other long term (current) drug therapy: Secondary | ICD-10-CM

## 2015-08-20 DIAGNOSIS — E871 Hypo-osmolality and hyponatremia: Secondary | ICD-10-CM | POA: Diagnosis not present

## 2015-08-20 DIAGNOSIS — I10 Essential (primary) hypertension: Secondary | ICD-10-CM | POA: Insufficient documentation

## 2015-08-20 DIAGNOSIS — R531 Weakness: Secondary | ICD-10-CM | POA: Insufficient documentation

## 2015-08-20 DIAGNOSIS — N390 Urinary tract infection, site not specified: Secondary | ICD-10-CM

## 2015-08-20 DIAGNOSIS — Z880 Allergy status to penicillin: Secondary | ICD-10-CM

## 2015-08-20 LAB — URINALYSIS COMPLETE WITH MICROSCOPIC (ARMC ONLY)
BILIRUBIN URINE: NEGATIVE
Glucose, UA: NEGATIVE mg/dL
Hgb urine dipstick: NEGATIVE
KETONES UR: NEGATIVE mg/dL
NITRITE: POSITIVE — AB
PH: 5 (ref 5.0–8.0)
Protein, ur: NEGATIVE mg/dL
SPECIFIC GRAVITY, URINE: 1.03 (ref 1.005–1.030)

## 2015-08-20 LAB — CBC WITH DIFFERENTIAL/PLATELET
BASOS ABS: 0 10*3/uL (ref 0–0.1)
BASOS PCT: 0 %
EOS ABS: 0 10*3/uL (ref 0–0.7)
EOS PCT: 0 %
HCT: 36.2 % (ref 35.0–47.0)
HEMOGLOBIN: 11.8 g/dL — AB (ref 12.0–16.0)
Lymphocytes Relative: 9 %
Lymphs Abs: 0.8 10*3/uL — ABNORMAL LOW (ref 1.0–3.6)
MCH: 30.3 pg (ref 26.0–34.0)
MCHC: 32.4 g/dL (ref 32.0–36.0)
MCV: 93.5 fL (ref 80.0–100.0)
Monocytes Absolute: 0.8 10*3/uL (ref 0.2–0.9)
Monocytes Relative: 9 %
NEUTROS PCT: 82 %
Neutro Abs: 6.9 10*3/uL — ABNORMAL HIGH (ref 1.4–6.5)
PLATELETS: 308 10*3/uL (ref 150–440)
RBC: 3.88 MIL/uL (ref 3.80–5.20)
RDW: 15.1 % — ABNORMAL HIGH (ref 11.5–14.5)
WBC: 8.5 10*3/uL (ref 3.6–11.0)

## 2015-08-20 LAB — BRAIN NATRIURETIC PEPTIDE: B Natriuretic Peptide: 640 pg/mL — ABNORMAL HIGH (ref 0.0–100.0)

## 2015-08-20 MED ORDER — IOHEXOL 240 MG/ML SOLN
25.0000 mL | Freq: Once | INTRAMUSCULAR | Status: AC | PRN
  Administered 2015-08-20: 25 mL via ORAL

## 2015-08-20 NOTE — ED Notes (Signed)
Patient states her sodium is low and she feels bad.

## 2015-08-20 NOTE — ED Provider Notes (Signed)
[facility] Emergency Department Provider Note  ____________________________________________  Time seen: Seen upon arrival to the emergency department  I have reviewed the triage vital signs and the nursing notes.   HISTORY  Chief Complaint Weakness    HPI Wyvonnia LoraFrances D Dancel is a 81 y.o. female with a history of hypertension and seizure who is presenting today with weakness and diffuse abdominal pain. She says the symptoms have been going on for 1 day. She was seen in this emergency department yesterday and discharged at that time with a diagnosis of abdominal pain and dyspnea. She is returning today for no improvement and now "looking swollen." Denies any chest pain. Is complaining of left upper left lower quadrant abdominal pain.   Past Medical History  Diagnosis Date  . Asthma   . Hypertension   . Arthritis   . Seizures (HCC)     There are no active problems to display for this patient.   Past Surgical History  Procedure Laterality Date  . Joint replacement Left   . Cardiac catheterization    . Back surgery    . Kyphoplasty N/A 04/29/2015    Procedure: KYPHOPLASTY  T-12, T-10, T-7;  Surgeon: Kennedy BuckerMichael Menz, MD;  Location: [facility] ORS;  Service: Orthopedics;  Laterality: N/A;    Current Outpatient Rx  Name  Route  Sig  Dispense  Refill  . acetaminophen (TYLENOL) 325 MG tablet   Oral   Take 325 mg by mouth every 8 (eight) hours.          Marland Kitchen. acetaminophen (TYLENOL) 325 MG tablet   Oral   Take 650 mg by mouth every 4 (four) hours as needed for mild pain, moderate pain, fever or headache.         . benzonatate (TESSALON) 100 MG capsule   Oral   Take 100 mg by mouth 2 (two) times daily.         . Biotin 1000 MCG tablet   Oral   Take 1,000 mcg by mouth daily.          . Cholecalciferol (VITAMIN D) 2000 UNITS tablet   Oral   Take 2,000 Units by mouth daily.         . hydrALAZINE (APRESOLINE) 50 MG tablet   Oral   Take 50 mg by mouth 2  (two) times daily.         Marland Kitchen. HYDROcodone-acetaminophen (NORCO/VICODIN) 5-325 MG per tablet   Oral   Take 0.5 tablets by mouth 3 (three) times daily.          Marland Kitchen. HYDROcodone-acetaminophen (NORCO/VICODIN) 5-325 MG tablet   Oral   Take 1 tablet by mouth every 6 (six) hours as needed for moderate pain or severe pain.         . hydrocortisone (CORTEF) 10 MG tablet   Oral   Take 10 mg by mouth 2 (two) times daily.         . Iron-Vitamin C (VITRON-C) 65-125 MG TABS   Oral   Take 1 tablet by mouth 3 (three) times a week. Take Monday - Wednesday - Friday         . levothyroxine (SYNTHROID, LEVOTHROID) 125 MCG tablet   Oral   Take 125 mcg by mouth daily.          Marland Kitchen. lidocaine (LIDODERM) 5 %   Transdermal   Place 1 patch onto the skin daily. Remove & Discard patch within 12 hours or as directed by MD         .  loperamide (IMODIUM) 2 MG capsule   Oral   Take 2 mg by mouth 2 (two) times daily as needed for diarrhea or loose stools.          . magnesium hydroxide (MILK OF MAGNESIA) 400 MG/5ML suspension   Oral   Take 45 mLs by mouth daily as needed for mild constipation or moderate constipation.          . magnesium oxide (MAG-OX) 400 MG tablet   Oral   Take 400 mg by mouth daily.         . metoprolol (LOPRESSOR) 100 MG tablet   Oral   Take 100 mg by mouth 2 (two) times daily.         . montelukast (SINGULAIR) 10 MG tablet   Oral   Take 10 mg by mouth at bedtime.         Marland Kitchen olmesartan (BENICAR) 40 MG tablet   Oral   Take 40 mg by mouth daily.         . ondansetron (ZOFRAN-ODT) 4 MG disintegrating tablet   Oral   Take 4 mg by mouth 3 (three) times daily.          . pantoprazole (PROTONIX) 40 MG tablet   Oral   Take 40 mg by mouth 2 (two) times daily. For 10 days then back down to 1 tablet daily         . polyethylene glycol (MIRALAX / GLYCOLAX) packet   Oral   Take 17 g by mouth daily. Drink with water         . pramipexole (MIRAPEX) 0.25 MG  tablet   Oral   Take 0.25 mg by mouth 3 (three) times daily.         . sodium chloride 1 G tablet   Oral   Take 1 g by mouth 2 (two) times daily.         . sucralfate (CARAFATE) 1 G tablet   Oral   Take 1 g by mouth 3 (three) times daily before meals.           Allergies Strawberry extract; Adhesive; Ciprofloxacin; Codeine; Furosemide; Macrodantin; Penicillins; Sulfa antibiotics; Valium; and Ziac  Family History  Problem Relation Age of Onset  . Breast cancer Mother   . Brain cancer Father     Social History Social History  Substance Use Topics  . Smoking status: Never Smoker   . Smokeless tobacco: Never Used  . Alcohol Use: No    Review of Systems Constitutional: Generalized weakness Eyes: No visual changes. ENT: No sore throat. Cardiovascular: Denies chest pain. Respiratory: Denies shortness of breath. Gastrointestinal: Left-sided abdominal pain.  No nausea, no vomiting.  No diarrhea.  No constipation. Genitourinary: Negative for dysuria. Musculoskeletal: Negative for back pain. Skin: Negative for rash. Neurological: Negative for headaches, focal weakness or numbness.  10-point ROS otherwise negative.  ____________________________________________   PHYSICAL EXAM:  VITAL SIGNS: ED Triage Vitals  Enc Vitals Group     BP --      Pulse --      Resp --      Temp --      Temp src --      SpO2 --      Weight --      Height --      Head Cir --      Peak Flow --      Pain Score --      Pain Loc --  Pain Edu? --      Excl. in GC? --     Constitutional: Alert and oriented. Well appearing and in no acute distress. Eyes: Conjunctivae are normal. PERRL. EOMI. Head: Atraumatic. Nose: No congestion/rhinnorhea. Mouth/Throat: Mucous membranes are moist.  Oropharynx non-erythematous. Neck: No stridor.   Cardiovascular: Normal rate, regular rhythm. Grossly normal heart sounds.  Good peripheral circulation. Respiratory: Normal respiratory effort.  No  retractions. Lungs CTAB. Gastrointestinal: Soft with left upper left lower and suprapubic abdominal tenderness. There is no rebound or guarding. No distention. No abdominal bruits. No CVA tenderness. Musculoskeletal: No lower extremity tenderness nor edema.  No joint effusions. Neurologic:  Normal speech and language. No gross focal neurologic deficits are appreciated. No gait instability. Skin:  Skin is warm, dry and intact. No rash noted. Psychiatric: Mood and affect are normal. Speech and behavior are normal.  ____________________________________________   LABS (all labs ordered are listed, but only abnormal results are displayed)  Labs Reviewed  CBC WITH DIFFERENTIAL/PLATELET - Abnormal; Notable for the following:    Hemoglobin 11.8 (*)    RDW 15.1 (*)    Neutro Abs 6.9 (*)    Lymphs Abs 0.8 (*)    All other components within normal limits  URINALYSIS COMPLETEWITH MICROSCOPIC ([facility] ONLY) - Abnormal; Notable for the following:    Color, Urine YELLOW (*)    APPearance CLEAR (*)    Nitrite POSITIVE (*)    Leukocytes, UA 1+ (*)    Bacteria, UA RARE (*)    Squamous Epithelial / LPF 0-5 (*)    All other components within normal limits  BRAIN NATRIURETIC PEPTIDE - Abnormal; Notable for the following:    B Natriuretic Peptide 640.0 (*)    All other components within normal limits  COMPREHENSIVE METABOLIC PANEL  LIPASE, BLOOD  TROPONIN I   ____________________________________________  EKG  ED ECG REPORT I, Arelia Longest, the attending physician, personally viewed and interpreted this ECG.   Date: 08/20/2015  EKG Time: 2200  Rate: 70  Rhythm: normal sinus rhythm  Axis: Normal axis  Intervals:none  ST&T Change: No ST segment elevation or depression. T-wave inversion in aVL. Read as inferior lead elevation by the EKG machine. However this is likely due to the patient's baseline. T-wave inversions unchanged from  07/26/2015.  ____________________________________________  RADIOLOGY  Chest x-ray with low inspiratory examination with bibasilar atelectasis. I personally reviewed these films. ____________________________________________   PROCEDURES   ____________________________________________   INITIAL IMPRESSION / ASSESSMENT AND PLAN / ED COURSE  Pertinent labs & imaging results that were available during my care of the patient were reviewed by me and considered in my medical decision making (see chart for details).  Patient with urinary tract infection. However, because of patient bouncing back and with persistent abdominal pain, we are pending a CAT scan of the abdomen and pelvis. Signed out to Dr. Dolores Frame. ____________________________________________   FINAL CLINICAL IMPRESSION(S) / ED DIAGNOSES  Abdominal pain. Urinary tract infection.    Myrna Blazer, MD 08/20/15 [phone]

## 2015-08-21 ENCOUNTER — Emergency Department: Payer: Medicare Other

## 2015-08-21 LAB — COMPREHENSIVE METABOLIC PANEL
ALBUMIN: 3.2 g/dL — AB (ref 3.5–5.0)
ALT: 31 U/L (ref 14–54)
ANION GAP: 6 (ref 5–15)
AST: 24 U/L (ref 15–41)
Alkaline Phosphatase: 62 U/L (ref 38–126)
BUN: 14 mg/dL (ref 6–20)
CHLORIDE: 98 mmol/L — AB (ref 101–111)
CO2: 26 mmol/L (ref 22–32)
Calcium: 8.9 mg/dL (ref 8.9–10.3)
Creatinine, Ser: 0.65 mg/dL (ref 0.44–1.00)
GFR calc Af Amer: >60 mL/min (ref >60)
GFR calc non Af Amer: >60 mL/min (ref >60)
GLUCOSE: 131 mg/dL — AB (ref 65–99)
POTASSIUM: 4.6 mmol/L (ref 3.5–5.1)
Sodium: 130 mmol/L — ABNORMAL LOW (ref 135–145)
Total Bilirubin: 0.5 mg/dL (ref 0.3–1.2)
Total Protein: 5.9 g/dL — ABNORMAL LOW (ref 6.5–8.1)

## 2015-08-21 LAB — TROPONIN I: Troponin I: <0.03 ng/mL (ref <0.031)

## 2015-08-21 LAB — LIPASE, BLOOD: LIPASE: 25 U/L (ref 11–51)

## 2015-08-21 MED ORDER — IOHEXOL 300 MG/ML  SOLN
100.0000 mL | Freq: Once | INTRAMUSCULAR | Status: AC | PRN
  Administered 2015-08-21: 100 mL via INTRAVENOUS

## 2015-08-21 MED ORDER — ONDANSETRON HCL 4 MG/2ML IJ SOLN
4.0000 mg | Freq: Once | INTRAMUSCULAR | Status: AC
  Administered 2015-08-21: 4 mg via INTRAVENOUS
  Filled 2015-08-21: qty 2

## 2015-08-21 MED ORDER — CIPROFLOXACIN IN D5W 400 MG/200ML IV SOLN
400.0000 mg | Freq: Once | INTRAVENOUS | Status: AC
  Administered 2015-08-21: 400 mg via INTRAVENOUS
  Filled 2015-08-21: qty 200

## 2015-08-21 MED ORDER — CIPROFLOXACIN HCL 250 MG PO TABS: 250.0000 mg | ORAL_TABLET | Freq: Two times a day (BID) | ORAL | Status: AC

## 2015-08-21 MED ORDER — ONDANSETRON HCL 4 MG PO TABS: 4.0000 mg | ORAL_TABLET | Freq: Three times a day (TID) | ORAL | Status: AC | PRN

## 2015-08-21 NOTE — ED Provider Notes (Signed)
-----------------------------------------   12:55 AM on 08/21/2015 -----------------------------------------  CT abdomen/pelvis interpreted per Dr. Cherly Hensenhang:  1. No acute abnormality seen to explain the patient's symptoms. 2. Scattered diverticulosis along the sigmoid colon, without evidence of diverticulitis. 3. Small to moderate hiatal hernia noted. 4. Mild bibasilar atelectasis noted. 5. Mild calcification along the abdominal aorta, and at the proximal renal arteries bilaterally. 6. Numerous compression deformities along the thoracic and lumbar spine, status post lumbar spinal fusion at L2-L 5, and vertebroplasty at multiple levels. Pedicle screws at L2 and L3 extend into the intervertebral discs, grossly unchanged in appearance.   Updated patient of CT results. She is resting in the bed without complaints. Will administer antibiotic for UTI. Patient tells me she is able to tolerate Cipro without anaphylaxis; only has associated nausea with Cipro.  ----------------------------------------- 3:18 AM on 08/21/2015 -----------------------------------------  Antibiotics completed. Patient did not have adverse reaction. There has been no vomiting after I assumed care. She has tolerated PO well. Plan to continue Cipro, Zofran as needed for nausea and close follow-up with her PCP. Strict return precautions given. Patient verbalizes understanding and agrees with plan of care.  Irean HongJade J Sung, MD 08/21/15 [phone]

## 2015-08-21 NOTE — Discharge Instructions (Signed)
1. Take antibiotic as prescribed (Cipro 250 mg twice daily 7 days). 2. You may take nausea medicine as needed (Zofran #20). 3. Return to the ER for worsening symptoms, persistent vomiting, difficulty breathing or other concerns.  Abdominal Pain, Adult Many things can cause abdominal pain. Usually, abdominal pain is not caused by a disease and will improve without treatment. It can often be observed and treated at home. Your health care provider will do a physical exam and possibly order blood tests and X-rays to help determine the seriousness of your pain. However, in many cases, more time must pass before a clear cause of the pain can be found. Before that point, your health care provider may not know if you need more testing or further treatment. HOME CARE INSTRUCTIONS Monitor your abdominal pain for any changes. The following actions may help to alleviate any discomfort you are experiencing:  Only take over-the-counter or prescription medicines as directed by your health care provider.  Do not take laxatives unless directed to do so by your health care provider.  Try a clear liquid diet (broth, tea, or water) as directed by your health care provider. Slowly move to a bland diet as tolerated. SEEK MEDICAL CARE IF:  You have unexplained abdominal pain.  You have abdominal pain associated with nausea or diarrhea.  You have pain when you urinate or have a bowel movement.  You experience abdominal pain that wakes you in the night.  You have abdominal pain that is worsened or improved by eating food.  You have abdominal pain that is worsened with eating fatty foods.  You have a fever. SEEK IMMEDIATE MEDICAL CARE IF:  Your pain does not go away within 2 hours.  You keep throwing up (vomiting).  Your pain is felt only in portions of the abdomen, such as the right side or the left lower portion of the abdomen.  You pass bloody or black tarry stools. MAKE SURE YOU:  Understand these  instructions.  Will watch your condition.  Will get help right away if you are not doing well or get worse.   This information is not intended to replace advice given to you by your health care provider. Make sure you discuss any questions you have with your health care provider.   Document Released: 07/19/2005 Document Revised: 06/30/2015 Document Reviewed: 06/18/2013 Elsevier Interactive Patient Education 2016 Elsevier Inc.  Urinary Tract Infection Urinary tract infections (UTIs) can develop anywhere along your urinary tract. Your urinary tract is your body's drainage system for removing wastes and extra water. Your urinary tract includes two kidneys, two ureters, a bladder, and a urethra. Your kidneys are a pair of bean-shaped organs. Each kidney is about the size of your fist. They are located below your ribs, one on each side of your spine. CAUSES Infections are caused by microbes, which are microscopic organisms, including fungi, viruses, and bacteria. These organisms are so small that they can only be seen through a microscope. Bacteria are the microbes that most commonly cause UTIs. SYMPTOMS  Symptoms of UTIs may vary by age and gender of the patient and by the location of the infection. Symptoms in young women typically include a frequent and intense urge to urinate and a painful, burning feeling in the bladder or urethra during urination. Older women and men are more likely to be tired, shaky, and weak and have muscle aches and abdominal pain. A fever may mean the infection is in your kidneys. Other symptoms of a kidney  infection include pain in your back or sides below the ribs, nausea, and vomiting. DIAGNOSIS To diagnose a UTI, your caregiver will ask you about your symptoms. Your caregiver will also ask you to provide a urine sample. The urine sample will be tested for bacteria and white blood cells. White blood cells are made by your body to help fight infection. TREATMENT    Typically, UTIs can be treated with medication. Because most UTIs are caused by a bacterial infection, they usually can be treated with the use of antibiotics. The choice of antibiotic and length of treatment depend on your symptoms and the type of bacteria causing your infection. HOME CARE INSTRUCTIONS  If you were prescribed antibiotics, take them exactly as your caregiver instructs you. Finish the medication even if you feel better after you have only taken some of the medication.  Drink enough water and fluids to keep your urine clear or pale yellow.  Avoid caffeine, tea, and carbonated beverages. They tend to irritate your bladder.  Empty your bladder often. Avoid holding urine for long periods of time.  Empty your bladder before and after sexual intercourse.  After a bowel movement, women should cleanse from front to back. Use each tissue only once. SEEK MEDICAL CARE IF:   You have back pain.  You develop a fever.  Your symptoms do not begin to resolve within 3 days. SEEK IMMEDIATE MEDICAL CARE IF:   You have severe back pain or lower abdominal pain.  You develop chills.  You have nausea or vomiting.  You have continued burning or discomfort with urination. MAKE SURE YOU:   Understand these instructions.  Will watch your condition.  Will get help right away if you are not doing well or get worse.   This information is not intended to replace advice given to you by your health care provider. Make sure you discuss any questions you have with your health care provider.   Document Released: 07/19/2005 Document Revised: 06/30/2015 Document Reviewed: 11/17/2011 Elsevier Interactive Patient Education 2016 Elsevier Inc.  Weakness Weakness is a lack of strength. It may be felt all over the body (generalized) or in one specific part of the body (focal). Some causes of weakness can be serious. You may need further medical evaluation, especially if you are elderly or you have  a history of immunosuppression (such as chemotherapy or HIV), kidney disease, heart disease, or diabetes. CAUSES  Weakness can be caused by many different things, including:  Infection.  Physical exhaustion.  Internal bleeding or other blood loss that results in a lack of red blood cells (anemia).  Dehydration. This cause is more common in elderly people.  Side effects or electrolyte abnormalities from medicines, such as pain medicines or sedatives.  Emotional distress, anxiety, or depression.  Circulation problems, especially severe peripheral arterial disease.  Heart disease, such as rapid atrial fibrillation, bradycardia, or heart failure.  Nervous system disorders, such as Guillain-Barr syndrome, multiple sclerosis, or stroke. DIAGNOSIS  To find the cause of your weakness, your caregiver will take your history and perform a physical exam. Lab tests or X-rays may also be ordered, if needed. TREATMENT  Treatment of weakness depends on the cause of your symptoms and can vary greatly. HOME CARE INSTRUCTIONS   Rest as needed.  Eat a well-balanced diet.  Try to get some exercise every day.  Only take over-the-counter or prescription medicines as directed by your caregiver. SEEK MEDICAL CARE IF:   Your weakness seems to be getting worse  or spreads to other parts of your body.  You develop new aches or pains. SEEK IMMEDIATE MEDICAL CARE IF:   You cannot perform your normal daily activities, such as getting dressed and feeding yourself.  You cannot walk up and down stairs, or you feel exhausted when you do so.  You have shortness of breath or chest pain.  You have difficulty moving parts of your body.  You have weakness in only one area of the body or on only one side of the body.  You have a fever.  You have trouble speaking or swallowing.  You cannot control your bladder or bowel movements.  You have black or bloody vomit or stools. MAKE SURE YOU:  Understand  these instructions.  Will watch your condition.  Will get help right away if you are not doing well or get worse.   This information is not intended to replace advice given to you by your health care provider. Make sure you discuss any questions you have with your health care provider.   Document Released: 10/09/2005 Document Revised: 04/09/2012 Document Reviewed: 12/08/2011 Elsevier Interactive Patient Education [employer] Elsevier Inc.

## 2015-08-23 ENCOUNTER — Emergency Department: Payer: Medicare Other

## 2015-08-23 ENCOUNTER — Inpatient Hospital Stay: Payer: Medicare Other

## 2015-08-23 ENCOUNTER — Encounter: Payer: Self-pay | Admitting: Emergency Medicine

## 2015-08-23 ENCOUNTER — Inpatient Hospital Stay: Admit: 2015-08-23 | Payer: Medicare Other

## 2015-08-23 ENCOUNTER — Inpatient Hospital Stay
Admission: EM | Admit: 2015-08-23 | Discharge: 2015-09-23 | DRG: 643 | Disposition: E | Payer: Medicare Other | Attending: Internal Medicine | Admitting: Internal Medicine

## 2015-08-23 DIAGNOSIS — I2510 Atherosclerotic heart disease of native coronary artery without angina pectoris: Secondary | ICD-10-CM | POA: Diagnosis present

## 2015-08-23 DIAGNOSIS — G9341 Metabolic encephalopathy: Secondary | ICD-10-CM | POA: Diagnosis present

## 2015-08-23 DIAGNOSIS — J9621 Acute and chronic respiratory failure with hypoxia: Secondary | ICD-10-CM | POA: Diagnosis not present

## 2015-08-23 DIAGNOSIS — M1990 Unspecified osteoarthritis, unspecified site: Secondary | ICD-10-CM | POA: Diagnosis present

## 2015-08-23 DIAGNOSIS — E662 Morbid (severe) obesity with alveolar hypoventilation: Secondary | ICD-10-CM | POA: Diagnosis present

## 2015-08-23 DIAGNOSIS — K122 Cellulitis and abscess of mouth: Secondary | ICD-10-CM | POA: Diagnosis present

## 2015-08-23 DIAGNOSIS — Z808 Family history of malignant neoplasm of other organs or systems: Secondary | ICD-10-CM | POA: Diagnosis not present

## 2015-08-23 DIAGNOSIS — R063 Periodic breathing: Secondary | ICD-10-CM | POA: Diagnosis not present

## 2015-08-23 DIAGNOSIS — Z978 Presence of other specified devices: Secondary | ICD-10-CM

## 2015-08-23 DIAGNOSIS — I110 Hypertensive heart disease with heart failure: Secondary | ICD-10-CM | POA: Diagnosis present

## 2015-08-23 DIAGNOSIS — R221 Localized swelling, mass and lump, neck: Secondary | ICD-10-CM | POA: Diagnosis not present

## 2015-08-23 DIAGNOSIS — R4182 Altered mental status, unspecified: Secondary | ICD-10-CM

## 2015-08-23 DIAGNOSIS — I272 Other secondary pulmonary hypertension: Secondary | ICD-10-CM | POA: Diagnosis present

## 2015-08-23 DIAGNOSIS — Z7952 Long term (current) use of systemic steroids: Secondary | ICD-10-CM

## 2015-08-23 DIAGNOSIS — Z803 Family history of malignant neoplasm of breast: Secondary | ICD-10-CM

## 2015-08-23 DIAGNOSIS — K219 Gastro-esophageal reflux disease without esophagitis: Secondary | ICD-10-CM | POA: Diagnosis present

## 2015-08-23 DIAGNOSIS — Z6831 Body mass index (BMI) 31.0-31.9, adult: Secondary | ICD-10-CM

## 2015-08-23 DIAGNOSIS — R0602 Shortness of breath: Secondary | ICD-10-CM

## 2015-08-23 DIAGNOSIS — E222 Syndrome of inappropriate secretion of antidiuretic hormone: Principal | ICD-10-CM | POA: Diagnosis present

## 2015-08-23 DIAGNOSIS — H9319 Tinnitus, unspecified ear: Secondary | ICD-10-CM | POA: Diagnosis present

## 2015-08-23 DIAGNOSIS — J9620 Acute and chronic respiratory failure, unspecified whether with hypoxia or hypercapnia: Secondary | ICD-10-CM | POA: Diagnosis not present

## 2015-08-23 DIAGNOSIS — J9612 Chronic respiratory failure with hypercapnia: Secondary | ICD-10-CM | POA: Diagnosis not present

## 2015-08-23 DIAGNOSIS — L03221 Cellulitis of neck: Secondary | ICD-10-CM | POA: Diagnosis present

## 2015-08-23 DIAGNOSIS — J9622 Acute and chronic respiratory failure with hypercapnia: Secondary | ICD-10-CM | POA: Diagnosis not present

## 2015-08-23 DIAGNOSIS — R918 Other nonspecific abnormal finding of lung field: Secondary | ICD-10-CM | POA: Insufficient documentation

## 2015-08-23 DIAGNOSIS — N390 Urinary tract infection, site not specified: Secondary | ICD-10-CM | POA: Diagnosis present

## 2015-08-23 DIAGNOSIS — I472 Ventricular tachycardia: Secondary | ICD-10-CM | POA: Diagnosis not present

## 2015-08-23 DIAGNOSIS — B9620 Unspecified Escherichia coli [E. coli] as the cause of diseases classified elsewhere: Secondary | ICD-10-CM | POA: Diagnosis present

## 2015-08-23 DIAGNOSIS — R410 Disorientation, unspecified: Secondary | ICD-10-CM | POA: Diagnosis not present

## 2015-08-23 DIAGNOSIS — K449 Diaphragmatic hernia without obstruction or gangrene: Secondary | ICD-10-CM | POA: Diagnosis present

## 2015-08-23 DIAGNOSIS — R0600 Dyspnea, unspecified: Secondary | ICD-10-CM | POA: Diagnosis not present

## 2015-08-23 DIAGNOSIS — I959 Hypotension, unspecified: Secondary | ICD-10-CM | POA: Diagnosis present

## 2015-08-23 DIAGNOSIS — I5033 Acute on chronic diastolic (congestive) heart failure: Secondary | ICD-10-CM | POA: Diagnosis present

## 2015-08-23 DIAGNOSIS — R1032 Left lower quadrant pain: Secondary | ICD-10-CM | POA: Diagnosis present

## 2015-08-23 DIAGNOSIS — Z66 Do not resuscitate: Secondary | ICD-10-CM | POA: Diagnosis not present

## 2015-08-23 DIAGNOSIS — R509 Fever, unspecified: Secondary | ICD-10-CM

## 2015-08-23 DIAGNOSIS — J9811 Atelectasis: Secondary | ICD-10-CM | POA: Diagnosis not present

## 2015-08-23 DIAGNOSIS — G4733 Obstructive sleep apnea (adult) (pediatric): Secondary | ICD-10-CM | POA: Diagnosis present

## 2015-08-23 DIAGNOSIS — E890 Postprocedural hypothyroidism: Secondary | ICD-10-CM | POA: Diagnosis present

## 2015-08-23 DIAGNOSIS — Z452 Encounter for adjustment and management of vascular access device: Secondary | ICD-10-CM

## 2015-08-23 DIAGNOSIS — R739 Hyperglycemia, unspecified: Secondary | ICD-10-CM | POA: Diagnosis present

## 2015-08-23 DIAGNOSIS — I4891 Unspecified atrial fibrillation: Secondary | ICD-10-CM | POA: Diagnosis present

## 2015-08-23 DIAGNOSIS — E876 Hypokalemia: Secondary | ICD-10-CM | POA: Diagnosis present

## 2015-08-23 DIAGNOSIS — N289 Disorder of kidney and ureter, unspecified: Secondary | ICD-10-CM | POA: Diagnosis present

## 2015-08-23 DIAGNOSIS — G2581 Restless legs syndrome: Secondary | ICD-10-CM | POA: Diagnosis present

## 2015-08-23 DIAGNOSIS — J9601 Acute respiratory failure with hypoxia: Secondary | ICD-10-CM | POA: Diagnosis not present

## 2015-08-23 DIAGNOSIS — K1121 Acute sialoadenitis: Secondary | ICD-10-CM | POA: Diagnosis present

## 2015-08-23 DIAGNOSIS — R531 Weakness: Secondary | ICD-10-CM

## 2015-08-23 DIAGNOSIS — J9602 Acute respiratory failure with hypercapnia: Secondary | ICD-10-CM | POA: Diagnosis not present

## 2015-08-23 DIAGNOSIS — Z8673 Personal history of transient ischemic attack (TIA), and cerebral infarction without residual deficits: Secondary | ICD-10-CM

## 2015-08-23 DIAGNOSIS — E871 Hypo-osmolality and hyponatremia: Secondary | ICD-10-CM | POA: Diagnosis present

## 2015-08-23 DIAGNOSIS — J45909 Unspecified asthma, uncomplicated: Secondary | ICD-10-CM | POA: Diagnosis present

## 2015-08-23 DIAGNOSIS — E860 Dehydration: Secondary | ICD-10-CM | POA: Diagnosis present

## 2015-08-23 DIAGNOSIS — Z01818 Encounter for other preprocedural examination: Secondary | ICD-10-CM

## 2015-08-23 DIAGNOSIS — E872 Acidosis: Secondary | ICD-10-CM | POA: Diagnosis present

## 2015-08-23 DIAGNOSIS — L8990 Pressure ulcer of unspecified site, unspecified stage: Secondary | ICD-10-CM | POA: Insufficient documentation

## 2015-08-23 DIAGNOSIS — R220 Localized swelling, mass and lump, head: Secondary | ICD-10-CM

## 2015-08-23 LAB — CBC WITH DIFFERENTIAL/PLATELET
Basophils Absolute: 0.1 10*3/uL (ref 0–0.1)
Basophils Relative: 1 %
EOS ABS: 0 10*3/uL (ref 0–0.7)
EOS PCT: 0 %
HCT: 35.6 % (ref 35.0–47.0)
HEMOGLOBIN: 12.2 g/dL (ref 12.0–16.0)
LYMPHS ABS: 0.7 10*3/uL — AB (ref 1.0–3.6)
Lymphocytes Relative: 8 %
MCH: 31.6 pg (ref 26.0–34.0)
MCHC: 34.3 g/dL (ref 32.0–36.0)
MCV: 92.2 fL (ref 80.0–100.0)
MONOS PCT: 9 %
Monocytes Absolute: 0.7 10*3/uL (ref 0.2–0.9)
NEUTROS PCT: 82 %
Neutro Abs: 6.8 10*3/uL — ABNORMAL HIGH (ref 1.4–6.5)
Platelets: 302 10*3/uL (ref 150–440)
RBC: 3.86 MIL/uL (ref 3.80–5.20)
RDW: 15.2 % — ABNORMAL HIGH (ref 11.5–14.5)
WBC: 8.3 10*3/uL (ref 3.6–11.0)

## 2015-08-23 LAB — URINALYSIS COMPLETE WITH MICROSCOPIC (ARMC ONLY)
Bacteria, UA: NONE SEEN
Bilirubin Urine: NEGATIVE
Glucose, UA: NEGATIVE mg/dL
Hgb urine dipstick: NEGATIVE
KETONES UR: NEGATIVE mg/dL
Leukocytes, UA: NEGATIVE
Nitrite: NEGATIVE
PROTEIN: 30 mg/dL — AB
Specific Gravity, Urine: 1.015 (ref 1.005–1.030)
pH: 7 (ref 5.0–8.0)

## 2015-08-23 LAB — COMPREHENSIVE METABOLIC PANEL
ALBUMIN: 3.5 g/dL (ref 3.5–5.0)
ALK PHOS: 59 U/L (ref 38–126)
ALT: 49 U/L (ref 14–54)
AST: 27 U/L (ref 15–41)
Anion gap: 7 (ref 5–15)
BILIRUBIN TOTAL: 0.4 mg/dL (ref 0.3–1.2)
BUN: 11 mg/dL (ref 6–20)
CALCIUM: 9.2 mg/dL (ref 8.9–10.3)
CO2: 26 mmol/L (ref 22–32)
CREATININE: 0.51 mg/dL (ref 0.44–1.00)
Chloride: 87 mmol/L — ABNORMAL LOW (ref 101–111)
GFR calc Af Amer: >60 mL/min (ref >60)
GFR calc non Af Amer: >60 mL/min (ref >60)
GLUCOSE: 126 mg/dL — AB (ref 65–99)
Potassium: 4.3 mmol/L (ref 3.5–5.1)
Sodium: 120 mmol/L — ABNORMAL LOW (ref 135–145)
TOTAL PROTEIN: 6.4 g/dL — AB (ref 6.5–8.1)

## 2015-08-23 LAB — TROPONIN I: Troponin I: <0.03 ng/mL (ref <0.031)

## 2015-08-23 LAB — CBC
HEMATOCRIT: 37.3 % (ref 35.0–47.0)
Hemoglobin: 12.2 g/dL (ref 12.0–16.0)
MCH: 30.4 pg (ref 26.0–34.0)
MCHC: 32.6 g/dL (ref 32.0–36.0)
MCV: 93.3 fL (ref 80.0–100.0)
PLATELETS: 300 10*3/uL (ref 150–440)
RBC: 4 MIL/uL (ref 3.80–5.20)
RDW: 15 % — ABNORMAL HIGH (ref 11.5–14.5)
WBC: 6.1 10*3/uL (ref 3.6–11.0)

## 2015-08-23 LAB — LIPASE, BLOOD: Lipase: 22 U/L (ref 11–51)

## 2015-08-23 LAB — URINE CULTURE

## 2015-08-23 LAB — BRAIN NATRIURETIC PEPTIDE: B Natriuretic Peptide: 783 pg/mL — ABNORMAL HIGH (ref 0.0–100.0)

## 2015-08-23 LAB — SODIUM, URINE, RANDOM: SODIUM UR: 175 mmol/L

## 2015-08-23 LAB — SODIUM: SODIUM: 120 mmol/L — AB (ref 135–145)

## 2015-08-23 LAB — CREATININE, URINE, RANDOM: CREATININE, URINE: 61 mg/dL

## 2015-08-23 LAB — CREATININE, SERUM
Creatinine, Ser: 0.48 mg/dL (ref 0.44–1.00)
GFR calc Af Amer: >60 mL/min (ref >60)
GFR calc non Af Amer: >60 mL/min (ref >60)

## 2015-08-23 MED ORDER — PRAMIPEXOLE DIHYDROCHLORIDE 0.25 MG PO TABS
0.2500 mg | ORAL_TABLET | Freq: Three times a day (TID) | ORAL | Status: DC
  Administered 2015-08-23 – 2015-08-27 (×12): 0.25 mg via ORAL
  Filled 2015-08-23 (×13): qty 1

## 2015-08-23 MED ORDER — METOPROLOL TARTRATE 100 MG PO TABS
100.0000 mg | ORAL_TABLET | Freq: Two times a day (BID) | ORAL | Status: DC
  Administered 2015-08-23 – 2015-08-26 (×7): 100 mg via ORAL
  Filled 2015-08-23 (×7): qty 1

## 2015-08-23 MED ORDER — MAGNESIUM HYDROXIDE 400 MG/5ML PO SUSP: 45.0000 mL | Freq: Every day | ORAL | Status: DC | PRN

## 2015-08-23 MED ORDER — LOPERAMIDE HCL 2 MG PO CAPS: 2.0000 mg | ORAL_CAPSULE | Freq: Two times a day (BID) | ORAL | Status: DC | PRN

## 2015-08-23 MED ORDER — HYDRALAZINE HCL 50 MG PO TABS
50.0000 mg | ORAL_TABLET | Freq: Two times a day (BID) | ORAL | Status: DC
  Administered 2015-08-23 – 2015-08-26 (×7): 50 mg via ORAL
  Filled 2015-08-23 (×7): qty 1

## 2015-08-23 MED ORDER — ONDANSETRON HCL 4 MG PO TABS
4.0000 mg | ORAL_TABLET | Freq: Four times a day (QID) | ORAL | Status: DC | PRN
  Administered 2015-08-23 – 2015-08-24 (×2): 4 mg via ORAL
  Filled 2015-08-23 (×2): qty 1

## 2015-08-23 MED ORDER — FERROUS SULFATE 325 (65 FE) MG PO TABS
325.0000 mg | ORAL_TABLET | ORAL | Status: DC
  Administered 2015-08-23 – 2015-09-01 (×5): 325 mg via ORAL
  Filled 2015-08-23 (×5): qty 1

## 2015-08-23 MED ORDER — ACETAMINOPHEN 650 MG RE SUPP: 650.0000 mg | Freq: Four times a day (QID) | RECTAL | Status: DC | PRN

## 2015-08-23 MED ORDER — POLYETHYLENE GLYCOL 3350 17 G PO PACK
17.0000 g | PACK | Freq: Every day | ORAL | Status: DC
  Administered 2015-08-24 – 2015-08-25 (×2): 17 g via ORAL
  Filled 2015-08-23 (×4): qty 1

## 2015-08-23 MED ORDER — FUROSEMIDE 10 MG/ML IJ SOLN: 40.0000 mg | Freq: Once | INTRAMUSCULAR | Status: DC

## 2015-08-23 MED ORDER — BIOTIN 1000 MCG PO TABS: 1000.0000 ug | ORAL_TABLET | Freq: Every day | ORAL | Status: DC

## 2015-08-23 MED ORDER — IRBESARTAN 75 MG PO TABS
37.5000 mg | ORAL_TABLET | Freq: Every day | ORAL | Status: DC
  Administered 2015-08-23 – 2015-08-26 (×4): 37.5 mg via ORAL
  Filled 2015-08-23 (×5): qty 1

## 2015-08-23 MED ORDER — ASPIRIN EC 81 MG PO TBEC
81.0000 mg | DELAYED_RELEASE_TABLET | Freq: Every day | ORAL | Status: DC
  Administered 2015-08-24 – 2015-08-26 (×3): 81 mg via ORAL
  Filled 2015-08-23 (×4): qty 1

## 2015-08-23 MED ORDER — ACETAMINOPHEN 325 MG PO TABS: 650.0000 mg | ORAL_TABLET | ORAL | Status: DC | PRN

## 2015-08-23 MED ORDER — LEVOTHYROXINE SODIUM 125 MCG PO TABS
125.0000 ug | ORAL_TABLET | Freq: Every day | ORAL | Status: DC
  Administered 2015-08-24 – 2015-08-25 (×2): 125 ug via ORAL
  Filled 2015-08-23 (×2): qty 1

## 2015-08-23 MED ORDER — MONTELUKAST SODIUM 10 MG PO TABS
10.0000 mg | ORAL_TABLET | Freq: Every day | ORAL | Status: DC
  Administered 2015-08-23 – 2015-08-26 (×4): 10 mg via ORAL
  Filled 2015-08-23 (×4): qty 1

## 2015-08-23 MED ORDER — SUCRALFATE 1 G PO TABS
1.0000 g | ORAL_TABLET | Freq: Three times a day (TID) | ORAL | Status: DC
  Administered 2015-08-24 – 2015-08-27 (×10): 1 g via ORAL
  Filled 2015-08-23 (×11): qty 1

## 2015-08-23 MED ORDER — ACETAMINOPHEN 325 MG PO TABS
650.0000 mg | ORAL_TABLET | Freq: Four times a day (QID) | ORAL | Status: DC | PRN
  Administered 2015-08-30 – 2015-08-31 (×2): 650 mg via ORAL
  Filled 2015-08-23 (×2): qty 2

## 2015-08-23 MED ORDER — ONDANSETRON HCL 4 MG/2ML IJ SOLN
4.0000 mg | Freq: Four times a day (QID) | INTRAMUSCULAR | Status: DC | PRN
  Administered 2015-08-25 – 2015-08-26 (×2): 4 mg via INTRAVENOUS
  Filled 2015-08-23 (×2): qty 2

## 2015-08-23 MED ORDER — ONDANSETRON HCL 4 MG PO TABS
4.0000 mg | ORAL_TABLET | Freq: Three times a day (TID) | ORAL | Status: DC | PRN
  Filled 2015-08-23 (×3): qty 1

## 2015-08-23 MED ORDER — HYDROCODONE-ACETAMINOPHEN 5-325 MG PO TABS
0.5000 | ORAL_TABLET | Freq: Three times a day (TID) | ORAL | Status: DC
  Administered 2015-08-23 – 2015-08-25 (×6): 0.5 via ORAL
  Filled 2015-08-23 (×6): qty 1

## 2015-08-23 MED ORDER — LIDOCAINE 5 % EX PTCH
1.0000 | MEDICATED_PATCH | Freq: Every day | CUTANEOUS | Status: DC
  Administered 2015-08-24 – 2015-09-01 (×9): 1 via TRANSDERMAL
  Filled 2015-08-23 (×10): qty 1

## 2015-08-23 MED ORDER — SODIUM CHLORIDE 1 G PO TABS
1.0000 g | ORAL_TABLET | Freq: Two times a day (BID) | ORAL | Status: DC
  Administered 2015-08-23 – 2015-08-27 (×8): 1 g via ORAL
  Filled 2015-08-23 (×11): qty 1

## 2015-08-23 MED ORDER — HYDROCORTISONE 10 MG PO TABS
10.0000 mg | ORAL_TABLET | Freq: Two times a day (BID) | ORAL | Status: DC
  Administered 2015-08-23 – 2015-08-27 (×8): 10 mg via ORAL
  Filled 2015-08-23 (×9): qty 1

## 2015-08-23 MED ORDER — ACETAMINOPHEN 325 MG PO TABS
325.0000 mg | ORAL_TABLET | Freq: Three times a day (TID) | ORAL | Status: DC
Start: 2015-08-23 — End: 2015-08-24
  Administered 2015-08-23 – 2015-08-24 (×3): 325 mg via ORAL
  Filled 2015-08-23 (×2): qty 1

## 2015-08-23 MED ORDER — PANTOPRAZOLE SODIUM 40 MG PO TBEC
40.0000 mg | DELAYED_RELEASE_TABLET | Freq: Two times a day (BID) | ORAL | Status: DC
  Administered 2015-08-23 – 2015-08-27 (×8): 40 mg via ORAL
  Filled 2015-08-23 (×9): qty 1

## 2015-08-23 MED ORDER — ONDANSETRON 4 MG PO TBDP
4.0000 mg | ORAL_TABLET | Freq: Three times a day (TID) | ORAL | Status: DC
  Administered 2015-08-23 – 2015-08-26 (×11): 4 mg via ORAL
  Filled 2015-08-23 (×15): qty 1

## 2015-08-23 MED ORDER — METOPROLOL TARTRATE 1 MG/ML IV SOLN: 5.0000 mg | INTRAVENOUS | Status: DC | PRN

## 2015-08-23 MED ORDER — MAGNESIUM OXIDE 400 MG PO TABS
400.0000 mg | ORAL_TABLET | Freq: Every day | ORAL | Status: DC
  Administered 2015-08-24 – 2015-08-26 (×3): 400 mg via ORAL
  Filled 2015-08-23 (×9): qty 1

## 2015-08-23 MED ORDER — IRON-VITAMIN C 65-125 MG PO TABS: 1.0000 | ORAL_TABLET | ORAL | Status: DC

## 2015-08-23 MED ORDER — CLINDAMYCIN PHOSPHATE 600 MG/50ML IV SOLN
600.0000 mg | Freq: Three times a day (TID) | INTRAVENOUS | Status: DC
  Administered 2015-08-23 – 2015-09-01 (×28): 600 mg via INTRAVENOUS
  Filled 2015-08-23 (×31): qty 50

## 2015-08-23 MED ORDER — BENZONATATE 100 MG PO CAPS
100.0000 mg | ORAL_CAPSULE | Freq: Two times a day (BID) | ORAL | Status: DC
  Administered 2015-08-23 – 2015-08-26 (×7): 100 mg via ORAL
  Filled 2015-08-23 (×8): qty 1

## 2015-08-23 MED ORDER — VITAMIN D 1000 UNITS PO TABS
2000.0000 [IU] | ORAL_TABLET | Freq: Every day | ORAL | Status: DC
  Administered 2015-08-24 – 2015-09-01 (×8): 2000 [IU] via ORAL
  Filled 2015-08-23 (×9): qty 2

## 2015-08-23 MED ORDER — CIPROFLOXACIN HCL 250 MG PO TABS
250.0000 mg | ORAL_TABLET | Freq: Two times a day (BID) | ORAL | Status: DC
  Administered 2015-08-23 – 2015-08-26 (×7): 250 mg via ORAL
  Filled 2015-08-23: qty 1
  Filled 2015-08-23: qty 2
  Filled 2015-08-23 (×7): qty 1

## 2015-08-23 MED ORDER — SODIUM CHLORIDE 0.9 % IV BOLUS (SEPSIS)
1000.0000 mL | Freq: Once | INTRAVENOUS | Status: AC
  Administered 2015-08-23: 1000 mL via INTRAVENOUS

## 2015-08-23 MED ORDER — ENOXAPARIN SODIUM 40 MG/0.4ML ~~LOC~~ SOLN
40.0000 mg | SUBCUTANEOUS | Status: DC
  Administered 2015-08-23 – 2015-08-31 (×9): 40 mg via SUBCUTANEOUS
  Filled 2015-08-23 (×9): qty 0.4

## 2015-08-23 MED ORDER — DOCUSATE SODIUM 100 MG PO CAPS
100.0000 mg | ORAL_CAPSULE | Freq: Two times a day (BID) | ORAL | Status: DC
  Administered 2015-08-23 – 2015-08-26 (×7): 100 mg via ORAL
  Filled 2015-08-23 (×8): qty 1

## 2015-08-23 MED ORDER — HYDROCODONE-ACETAMINOPHEN 5-325 MG PO TABS
1.0000 | ORAL_TABLET | Freq: Four times a day (QID) | ORAL | Status: DC | PRN
  Administered 2015-08-31 – 2015-09-01 (×2): 1 via ORAL
  Filled 2015-08-23 (×2): qty 1

## 2015-08-23 MED ORDER — VITAMIN C 500 MG PO TABS
250.0000 mg | ORAL_TABLET | ORAL | Status: DC
  Administered 2015-08-23 – 2015-09-01 (×5): 250 mg via ORAL
  Filled 2015-08-23 (×2): qty 1
  Filled 2015-08-23: qty 2
  Filled 2015-08-23 (×2): qty 1

## 2015-08-23 NOTE — Progress Notes (Signed)
Pt is a&o, SR on tele with a complaint of abd pain and nausea in which scheduled meds were given. Tele box verified and skin verified with Kemper Durieonnisha, RN. Pt oriented to unit, equipment and room. Pt resting comfortably in room. Will continue to assess.

## 2015-08-23 NOTE — ED Notes (Signed)
Intermittent abd painx 2 weeks, worse today, lower L abd pain, chest tightness on room arrival,pt states she feels like she is "fighting a panic attack"

## 2015-08-23 NOTE — H&P (Signed)
[facility] [place] [facility]   PATIENT NAME: [name]    MR#:  [id number]  DATE OF BIRTH:  Jan 11, 1929  DATE OF ADMISSION:  09/09/15  PRIMARY CARE PHYSICIAN: Danella Penton., MD   REQUESTING/REFERRING PHYSICIAN: Dr. Scotty Court  CHIEF COMPLAINT:  Weakness and intermittent episodes of confusion  HISTORY OF PRESENT ILLNESS:  [name]  is a 81 y.o. female with a known history of essential hypertension, hypothyroidism and recent diagnosis of Escherichia coli UTI on ciprofloxacin is presenting to the ED with a chief complaint of generalized weakness associated with some fogginess and confusion. Patient usually ambulates without assistance but for the past 34 days she has needed assistance to get out of the bed as she was feeling weak. Denies any fall or head trauma. Patient is reporting abdominal distention with some weight gain. Reporting exertional dyspnea also. Patient is concerned that her neck is swollen and painful for the past one week but denies any difficulty with swallowing and rash . No similar complaints in the past. Patient was given salt tablets and asked to do fluid restriction by her primary care physician. Patient's sodium was at 1:30 on October 28 and today it is at 120. BNP was elevated at 640 on October 28. Resting comfortably during my examination and answering most of the questions appropriately although few episodes of a pleasant confusion were noticed in between. Son is at bedside  PAST MEDICAL HISTORY:   Past Medical History  Diagnosis Date  . Asthma   . Hypertension   . Arthritis   . Seizures (HCC)     PAST SURGICAL HISTOIRY:   Past Surgical History  Procedure Laterality Date  . Joint replacement Left   . Cardiac catheterization    . Back surgery    . Kyphoplasty N/A 04/29/2015    Procedure: KYPHOPLASTY  T-12, T-10, T-7;  Surgeon: Kennedy Bucker, MD;  Location: [facility] ORS;  Service: Orthopedics;  Laterality: N/A;    SOCIAL  HISTORY:   Social History  Substance Use Topics  . Smoking status: Never Smoker   . Smokeless tobacco: Never Used  . Alcohol Use: No    FAMILY HISTORY:   Family History  Problem Relation Age of Onset  . Breast cancer Mother   . Brain cancer Father     DRUG ALLERGIES:   Allergies  Allergen Reactions  . Strawberry Extract Anaphylaxis    Pt states her throat starts to close.   . Adhesive [Tape] Other (See Comments)    unknown  . Ciprofloxacin Other (See Comments)    unknown  . Codeine Other (See Comments)    unknown  . Furosemide Other (See Comments)    unknown  . Macrodantin [Nitrofurantoin Macrocrystal] Nausea Only  . Penicillins Hives  . Sulfa Antibiotics Other (See Comments)    unknown  . Valium [Diazepam] Other (See Comments)    Altered mental status  . Ziac [Bisoprolol-Hydrochlorothiazide] Other (See Comments)    unknown    REVIEW OF SYSTEMS:  CONSTITUTIONAL: No fever, fatigue or weakness.  EYES: No blurred or double vision.  EARS, NOSE, AND THROAT: No tinnitus or ear pain. Reporting neck swelling and pain for a week RESPIRATORY: No cough, shortness of breath, wheezing or hemoptysis.  CARDIOVASCULAR: No chest pain, orthopnea, edema.  GASTROINTESTINAL: No nausea, vomiting, diarrhea or abdominal pain.  GENITOURINARY: No dysuria, hematuria.  ENDOCRINE: No polyuria, nocturia,  HEMATOLOGY: No anemia, easy bruising or bleeding SKIN: No rash or lesion. MUSCULOSKELETAL: No joint pain or  arthritis.   NEUROLOGIC: No tingling, numbness, weakness.  PSYCHIATRY: No anxiety or depression.   MEDICATIONS AT HOME:   Prior to Admission medications   Medication Sig Start Date End Date Taking? Authorizing Provider  acetaminophen (TYLENOL) 325 MG tablet Take 325 mg by mouth every 8 (eight) hours.    Yes Historical Provider, MD  acetaminophen (TYLENOL) 325 MG tablet Take 650 mg by mouth every 4 (four) hours as needed for mild pain, moderate pain, fever or headache.   Yes  Historical Provider, MD  benzonatate (TESSALON) 100 MG capsule Take 100 mg by mouth 2 (two) times daily.   Yes Historical Provider, MD  Biotin 1000 MCG tablet Take 1,000 mcg by mouth daily.    Yes Historical Provider, MD  Cholecalciferol (VITAMIN D) 2000 UNITS tablet Take 2,000 Units by mouth daily.   Yes Historical Provider, MD  ciprofloxacin (CIPRO) 250 MG tablet Take 1 tablet (250 mg total) by mouth 2 (two) times daily. 08/21/15 08/27/15 Yes Irean HongJade J Sung, MD  hydrALAZINE (APRESOLINE) 50 MG tablet Take 50 mg by mouth 2 (two) times daily.   Yes Historical Provider, MD  HYDROcodone-acetaminophen (NORCO/VICODIN) 5-325 MG per tablet Take 0.5 tablets by mouth 3 (three) times daily.    Yes Historical Provider, MD  HYDROcodone-acetaminophen (NORCO/VICODIN) 5-325 MG tablet Take 1 tablet by mouth every 6 (six) hours as needed for moderate pain or severe pain.   Yes Historical Provider, MD  hydrocortisone (CORTEF) 10 MG tablet Take 10 mg by mouth 2 (two) times daily.   Yes Historical Provider, MD  Iron-Vitamin C (VITRON-C) 65-125 MG TABS Take 1 tablet by mouth 3 (three) times a week. Take Monday - Wednesday - Friday   Yes Historical Provider, MD  levothyroxine (SYNTHROID, LEVOTHROID) 125 MCG tablet Take 125 mcg by mouth daily.    Yes Historical Provider, MD  lidocaine (LIDODERM) 5 % Place 1 patch onto the skin daily. Remove & Discard patch within 12 hours or as directed by MD   Yes Historical Provider, MD  loperamide (IMODIUM) 2 MG capsule Take 2 mg by mouth 2 (two) times daily as needed for diarrhea or loose stools.    Yes Historical Provider, MD  magnesium hydroxide (MILK OF MAGNESIA) 400 MG/5ML suspension Take 45 mLs by mouth daily as needed for mild constipation or moderate constipation.    Yes Historical Provider, MD  magnesium oxide (MAG-OX) 400 MG tablet Take 400 mg by mouth daily.   Yes Historical Provider, MD  metoprolol (LOPRESSOR) 100 MG tablet Take 100 mg by mouth 2 (two) times daily.   Yes  Historical Provider, MD  montelukast (SINGULAIR) 10 MG tablet Take 10 mg by mouth at bedtime.   Yes Historical Provider, MD  olmesartan (BENICAR) 40 MG tablet Take 40 mg by mouth daily.   Yes Historical Provider, MD  ondansetron (ZOFRAN) 4 MG tablet Take 1 tablet (4 mg total) by mouth every 8 (eight) hours as needed for nausea or vomiting. 08/21/15  Yes Irean HongJade J Sung, MD  ondansetron (ZOFRAN-ODT) 4 MG disintegrating tablet Take 4 mg by mouth 3 (three) times daily.    Yes Historical Provider, MD  pantoprazole (PROTONIX) 40 MG tablet Take 40 mg by mouth 2 (two) times daily. For 10 days then back down to 1 tablet daily 08/17/15  Yes Historical Provider, MD  polyethylene glycol (MIRALAX / GLYCOLAX) packet Take 17 g by mouth daily. Drink with water   Yes Historical Provider, MD  pramipexole (MIRAPEX) 0.25 MG tablet Take 0.25 mg by  mouth 3 (three) times daily.   Yes Historical Provider, MD  sodium chloride 1 G tablet Take 1 g by mouth 2 (two) times daily.   Yes Historical Provider, MD  sucralfate (CARAFATE) 1 G tablet Take 1 g by mouth 3 (three) times daily before meals.   Yes Historical Provider, MD      VITAL SIGNS:  Blood pressure 224/104, pulse 65, temperature 98 F (36.7 C), temperature source Oral, resp. rate 22, height  (1.575 m), weight 75.297 kg (166 lb), SpO2 90 %.  PHYSICAL EXAMINATION:  GENERAL:  81 y.o.-year-old patient lying in the bed with no acute distress.  EYES: Pupils equal, round, reactive to light and accommodation. No scleral icterus. Extraocular muscles intact.  HEENT: Head atraumatic, normocephalic. Oropharynx and nasopharynx clear.  NECK:  Supple, no jugular venous distention. Neck is edematous with submandibular tenderness bilaterally No thyroid enlargement, no palpable lymphadenopathy LUNGS: Normal breath sounds bilaterally, no wheezing, rales,rhonchi or crepitation. No use of accessory muscles of respiration.  CARDIOVASCULAR: S1, S2 normal. No murmurs, rubs, or  gallops.  ABDOMEN: Soft, nontender, nondistended. Bowel sounds present. No organomegaly or mass.  EXTREMITIES: No pedal edema, cyanosis, or clubbing.  NEUROLOGIC: Cranial nerves II through XII are intact. Muscle strength 5/5 in all extremities. Sensation intact. Gait not checked.  PSYCHIATRIC: The patient is alert and oriented x 3.  SKIN: No obvious rash, lesion, or ulcer.   LABORATORY PANEL:   CBC  Recent Labs Lab Aug 26, 2015 1136  WBC 8.3  HGB 12.2  HCT 35.6  PLT 302   ------------------------------------------------------------------------------------------------------------------  Chemistries   Recent Labs Lab 08/26/15 1136  NA 120*  K 4.3  CL 87*  CO2 26  GLUCOSE 126*  BUN 11  CREATININE 0.51  CALCIUM 9.2  AST 27  ALT 49  ALKPHOS 59  BILITOT 0.4   ------------------------------------------------------------------------------------------------------------------  Cardiac Enzymes  Recent Labs Lab 2015-08-26 1136  TROPONINI <0.03   ------------------------------------------------------------------------------------------------------------------  RADIOLOGY:  Dg Chest 2 View  08-26-2015  CLINICAL DATA:  Dyspnea.  Nonproductive cough.  Chest tightness . EXAM: CHEST  2 VIEW COMPARISON:  08/20/2015 chest radiograph FINDINGS: Stable moderate eventration of the right hemidiaphragm. Stable cardiomediastinal silhouette with mild cardiomegaly. No pneumothorax. No pleural effusion. Stable mild curvilinear opacities at both lung bases, in keeping with mild bibasilar atelectasis. No pulmonary edema. Multiple thoracolumbar spine compression deformities are noted status post vertebroplasty. IMPRESSION: Stable chest radiograph with mild bibasilar atelectasis and chronic right hemidiaphragmatic eventration. Electronically Signed   By: Delbert Phenix M.D.   On: 26-Aug-2015 11:38   Ct Soft Tissue Neck Wo Contrast  2015-08-26  CLINICAL DATA:  Facial swelling around mandible and  extending into anterior neck for the last 3 days. EXAM: CT NECK WITHOUT CONTRAST TECHNIQUE: Multidetector CT imaging of the neck was performed following the standard protocol without intravenous contrast. COMPARISON:  None. FINDINGS: Both submandibular salivary glands appear relatively enlarged and edematous with surrounding inflammatory changes. Inflammation extends out towards the skin and is slightly more prominent on the right compared to the left. Findings are suggestive of cellulitis involving the submandibular glands. No focal abscess identified. No obstructing salivary calculi or foreign bodies are identified by CT. The parotid glands appear normal. No focal masses or enlarged lymph nodes are seen. The visualized airway is normally patent. Paranasal sinuses and mastoid air cells are normally aerated. Diffuse degenerative disease noted of the cervical spine. There is a small nodule in the periphery of the left upper lobe abutting the lateral aspect of  the major fissure and measuring approximately 4 x 6 mm. IMPRESSION: 1. Submandibular region cellulitis with inflammatory changes also seen involving both submandibular salivary glands. No focal abscess. 2. Incidental 4 x 6 mm left upper lobe pulmonary nodule. If the patient is at high risk for bronchogenic carcinoma, follow-up chest CT at 6-12 months is recommended. If the patient is at low risk for bronchogenic carcinoma, follow-up chest CT at 12 months is recommended. This recommendation follows the consensus statement: Guidelines for Management of Small Pulmonary Nodules Detected on CT Scans: A Statement from the Fleischner Society as published in Radiology 2005;237:395-400. Electronically Signed   By: Irish Lack M.D.   On: August 31, 2015 16:51    EKG:   Orders placed or performed during the hospital encounter of 08/31/15  . ED EKG  . ED EKG  . EKG 12-Lead  . EKG 12-Lead    IMPRESSION AND PLAN:  [name]  is a 81 y.o. female with a known  history of essential hypertension, hypothyroidism and recent diagnosis of Escherichia coli UTI on ciprofloxacin is presenting to the ED with a chief complaint of generalized weakness associated with some fogginess and confusion. Patient usually ambulates without assistance but for the past 34 days she has needed assistance to get out of the bed as she was feeling weak. Denies any fall or head trauma. Patient is reporting abdominal distention with some weight gain. Reporting exertional dyspnea also. Patient is concerned that her neck is swollen and painful for the past one week but denies any difficulty with swallowing and rash . No similar complaints in the past. Patient was given salt tablets and asked to do fluid restriction by her primary care physician. Patient's sodium was at 1:30 on October 28 and today it is at 120. BNP was elevated at 640 on October 28.  1. Acute hyponatremia probably 2/2  hypervolemic hyponatremia Patient failed outpatient fluid restriction and salt tablets by her primary care physician Will provide her IV Lasix Monitor serial sodiums  Check urine and serum osmolarity. Also ordered random urine sodium, creatinine and urine osmolarity Nephrology consult is placed  2. Acute submandibular cellulitis with neck swelling and pain IV clindamycin is ordered and ENT consult is placed  3. Weight gain with abdominal distention, shortness of breath and elevated BNP  (08/20/15 )-rule out acute congestive heart failure Will get echocardiogram and a repeat BNP is pending Monitor daily weights and check intake and output Provide cardiac diet Lasix 40 mg IV was given in the ED  4. Recent diagnosis of Escherichia coli UTI sensitive to ciprofloxacin Continue by mouth ciprofloxacin for a total of 7-10 days   5. History of hypothyroidism continue Synthroid and check TSH in a.m.  6. Essential hypertension-continue Lopressor her home medication and titrate as needed basis  7. Pulmonary  nodule-outpatient follow-up with primary care physician for further evaluation    All the records are reviewed and case discussed with ED provider. Management plans discussed with the patient, family and they are in agreement. Greater than 50% time was spent on coordination of care and face-to-face counseling  CODE STATUS: Full code, son is the healthcare power of attorney  TOTAL TIME TAKING CARE OF THIS PATIENT: 50 minutes.    Ramonita Lab M.D on 2015/08/31 at 5:34 PM  Between 7am to 6pm - Pager - [phone]  After 6pm go to [email or web address] - password [email or web address] [facility]  [facility]  Office  [phone]  CC: Primary care physician; Danella Penton., MD

## 2015-08-23 NOTE — ED Provider Notes (Signed)
[facility] Emergency Department Provider Note  ____________________________________________  Time seen: 10:50 AM on arrival by EMS  I have reviewed the triage vital signs and the nursing notes.   HISTORY  Chief Complaint Abdominal Pain    HPI [name] is a 81 y.o. female who complains of generalized weakness as well as clouded cognition and confusion. She states that she normally walks without assistance but recently she has required assistance to get out of bed to move the chair or to go to the bathroom. No falls or head injuries currently. She also complains of some generalized abdominal pain that she recently had a CT scan for which was unremarkable.     Past Medical History  Diagnosis Date  . Asthma   . Hypertension   . Arthritis   . Seizures (HCC)      There are no active problems to display for this patient.    Past Surgical History  Procedure Laterality Date  . Joint replacement Left   . Cardiac catheterization    . Back surgery    . Kyphoplasty N/A 04/29/2015    Procedure: KYPHOPLASTY  T-12, T-10, T-7;  Surgeon: Kennedy Bucker, MD;  Location: [facility] ORS;  Service: Orthopedics;  Laterality: N/A;     Current Outpatient Rx  Name  Route  Sig  Dispense  Refill  . acetaminophen (TYLENOL) 325 MG tablet   Oral   Take 325 mg by mouth every 8 (eight) hours.          Marland Kitchen acetaminophen (TYLENOL) 325 MG tablet   Oral   Take 650 mg by mouth every 4 (four) hours as needed for mild pain, moderate pain, fever or headache.         . benzonatate (TESSALON) 100 MG capsule   Oral   Take 100 mg by mouth 2 (two) times daily.         . Biotin 1000 MCG tablet   Oral   Take 1,000 mcg by mouth daily.          . Cholecalciferol (VITAMIN D) 2000 UNITS tablet   Oral   Take 2,000 Units by mouth daily.         . ciprofloxacin (CIPRO) 250 MG tablet   Oral   Take 1 tablet (250 mg total) by mouth 2 (two) times daily.   14 tablet   0   .  hydrALAZINE (APRESOLINE) 50 MG tablet   Oral   Take 50 mg by mouth 2 (two) times daily.         Marland Kitchen HYDROcodone-acetaminophen (NORCO/VICODIN) 5-325 MG per tablet   Oral   Take 0.5 tablets by mouth 3 (three) times daily.          Marland Kitchen HYDROcodone-acetaminophen (NORCO/VICODIN) 5-325 MG tablet   Oral   Take 1 tablet by mouth every 6 (six) hours as needed for moderate pain or severe pain.         . hydrocortisone (CORTEF) 10 MG tablet   Oral   Take 10 mg by mouth 2 (two) times daily.         . Iron-Vitamin C (VITRON-C) 65-125 MG TABS   Oral   Take 1 tablet by mouth 3 (three) times a week. Take Monday - Wednesday - Friday         . levothyroxine (SYNTHROID, LEVOTHROID) 125 MCG tablet   Oral   Take 125 mcg by mouth daily.          Marland Kitchen lidocaine (LIDODERM)  5 %   Transdermal   Place 1 patch onto the skin daily. Remove & Discard patch within 12 hours or as directed by MD         . loperamide (IMODIUM) 2 MG capsule   Oral   Take 2 mg by mouth 2 (two) times daily as needed for diarrhea or loose stools.          . magnesium hydroxide (MILK OF MAGNESIA) 400 MG/5ML suspension   Oral   Take 45 mLs by mouth daily as needed for mild constipation or moderate constipation.          . magnesium oxide (MAG-OX) 400 MG tablet   Oral   Take 400 mg by mouth daily.         . metoprolol (LOPRESSOR) 100 MG tablet   Oral   Take 100 mg by mouth 2 (two) times daily.         . montelukast (SINGULAIR) 10 MG tablet   Oral   Take 10 mg by mouth at bedtime.         Marland Kitchen. olmesartan (BENICAR) 40 MG tablet   Oral   Take 40 mg by mouth daily.         . ondansetron (ZOFRAN) 4 MG tablet   Oral   Take 1 tablet (4 mg total) by mouth every 8 (eight) hours as needed for nausea or vomiting.   20 tablet   1   . ondansetron (ZOFRAN-ODT) 4 MG disintegrating tablet   Oral   Take 4 mg by mouth 3 (three) times daily.          . pantoprazole (PROTONIX) 40 MG tablet   Oral   Take 40 mg by  mouth 2 (two) times daily. For 10 days then back down to 1 tablet daily         . polyethylene glycol (MIRALAX / GLYCOLAX) packet   Oral   Take 17 g by mouth daily. Drink with water         . pramipexole (MIRAPEX) 0.25 MG tablet   Oral   Take 0.25 mg by mouth 3 (three) times daily.         . sodium chloride 1 G tablet   Oral   Take 1 g by mouth 2 (two) times daily.         . sucralfate (CARAFATE) 1 G tablet   Oral   Take 1 g by mouth 3 (three) times daily before meals.            Allergies Strawberry extract; Adhesive; Ciprofloxacin; Codeine; Furosemide; Macrodantin; Penicillins; Sulfa antibiotics; Valium; and Ziac   Family History  Problem Relation Age of Onset  . Breast cancer Mother   . Brain cancer Father     Social History Social History  Substance Use Topics  . Smoking status: Never Smoker   . Smokeless tobacco: Never Used  . Alcohol Use: No    Review of Systems  Constitutional:   No fever or chills. No weight changes Eyes:   No blurry vision or double vision.  ENT:   No sore throat. Cardiovascular:   No chest pain. Respiratory:   No dyspnea or cough. Gastrointestinal:   Positive for abdominal pain, without vomiting and diarrhea.  No BRBPR or melena. Genitourinary:   Negative for dysuria, urinary retention, bloody urine, or difficulty urinating. Musculoskeletal:   Negative for back pain. No joint swelling or pain. Skin:   Negative for rash. Neurological:   Negative for  headaches, focal weakness or numbness. Psychiatric:  No anxiety or depression.   Endocrine:  No hot/cold intolerance, changes in energy, or sleep difficulty.  10-point ROS otherwise negative.  ____________________________________________   PHYSICAL EXAM:  VITAL SIGNS: ED Triage Vitals  Enc Vitals Group     BP September 15, 2015 1103 182/92 mmHg     Pulse Rate 2015/09/15 1103 60     Resp 09-15-15 1103 18     Temp Sep 15, 2015 1103 97.6 F (36.4 C)     Temp Source 09/15/2015 1103 Oral      SpO2 09/15/15 1103 96 %     Weight 09-15-2015 1103 166 lb (75.297 kg)     Height 2015/09/15 1103 5\' 2"  (1.575 m)     Head Cir --      Peak Flow --      Pain Score 2015-09-15 1106 6     Pain Loc --      Pain Edu? --      Excl. in GC? --      Constitutional:   Alert and oriented. Well appearing and in no distress. Eyes:   No scleral icterus. No conjunctival pallor. PERRL. EOMI ENT   Head:   Normocephalic and atraumatic.   Nose:   No congestion/rhinnorhea. No septal hematoma   Mouth/Throat:   MMM, no pharyngeal erythema. No peritonsillar mass. No uvula shift.   Neck:   No stridor. No SubQ emphysema. No meningismus. Hematological/Lymphatic/Immunilogical:   No cervical lymphadenopathy. Cardiovascular:   RRR. Normal and symmetric distal pulses are present in all extremities. No murmurs, rubs, or gallops. Respiratory:   Normal respiratory effort without tachypnea nor retractions. Breath sounds are clear and equal bilaterally. No wheezes/rales/rhonchi. Gastrointestinal:   Soft with mild left lower quadrant tenderness consistent with previous. No distention. There is no CVA tenderness.  No rebound, rigidity, or guarding. Genitourinary:   deferred Musculoskeletal:   Nontender with normal range of motion in all extremities. No joint effusions.  No lower extremity tenderness.  No edema. Neurologic:   Normal speech and language.  CN 2-10 normal. Motor grossly intact. No pronator drift.   No gross focal neurologic deficits are appreciated.  Skin:    Skin is warm, dry and intact. No rash noted.  No petechiae, purpura, or bullae. Psychiatric:   Mood and affect are normal. Speech and behavior are normal. Patient exhibits appropriate insight and judgment. Patient reports difficulty with concentration  ____________________________________________    LABS (pertinent positives/negatives) (all labs ordered are listed, but only abnormal results are displayed) Labs Reviewed  COMPREHENSIVE  METABOLIC PANEL - Abnormal; Notable for the following:    Sodium 120 (*)    Chloride 87 (*)    Glucose, Bld 126 (*)    Total Protein 6.4 (*)    All other components within normal limits  CBC WITH DIFFERENTIAL/PLATELET - Abnormal; Notable for the following:    RDW 15.2 (*)    Neutro Abs 6.8 (*)    Lymphs Abs 0.7 (*)    All other components within normal limits  URINALYSIS COMPLETEWITH MICROSCOPIC ([facility] ONLY) - Abnormal; Notable for the following:    Color, Urine YELLOW (*)    APPearance CLEAR (*)    Protein, ur 30 (*)    Squamous Epithelial / LPF 0-5 (*)    All other components within normal limits  LIPASE, BLOOD  TROPONIN I   ____________________________________________   EKG  Interpreted by me Normal sinus rhythm rate of 65, normal axis and intervals, normal QRS, normal ST segments.  Isolated T-wave inversion in aVL which is nonspecific  ____________________________________________    RADIOLOGY  Chest x-ray unremarkable  ____________________________________________   PROCEDURES   ____________________________________________   INITIAL IMPRESSION / ASSESSMENT AND PLAN / ED COURSE  Pertinent labs & imaging results that were available during my care of the patient were reviewed by me and considered in my medical decision making (see chart for details).  Patient presents with generalized weakness with a loss of function without any focal findings on exam. We'll get labs and chest x-ray.  ----------------------------------------- 3:23 PM on 07/25/2015 -----------------------------------------  Workup reveals hyponatremia with a sodium level of 120. This is a significant drop from just 3 days ago. We'll give her IV saline boluses and admit for further management. This decline in sodium is in spite of salt supplements and fluid restriction that her primary care doctor has been implementing over the past week and a half. Case discussed with the hospitalist for  admission     ____________________________________________   FINAL CLINICAL IMPRESSION(S) / ED DIAGNOSES  Final diagnoses:  Hyponatremia  Generalized weakness      Sharman Cheek, MD 08/19/2015 1523

## 2015-08-23 NOTE — ED Notes (Signed)
Patient transported to CT 

## 2015-08-24 ENCOUNTER — Inpatient Hospital Stay (HOSPITAL_COMMUNITY)
Admit: 2015-08-24 | Discharge: 2015-08-24 | Disposition: A | Discharge status: Home / Self Care | Payer: Medicare Other | Attending: Internal Medicine | Admitting: Internal Medicine

## 2015-08-24 ENCOUNTER — Inpatient Hospital Stay: Payer: Medicare Other

## 2015-08-24 DIAGNOSIS — R0600 Dyspnea, unspecified: Secondary | ICD-10-CM

## 2015-08-24 LAB — SODIUM
SODIUM: 120 mmol/L — AB (ref 135–145)
SODIUM: 121 mmol/L — AB (ref 135–145)
SODIUM: 120 mmol/L — AB (ref 135–145)

## 2015-08-24 LAB — CHLORIDE, URINE, RANDOM: Chloride Urine: 110 mmol/L

## 2015-08-24 LAB — TSH: TSH: 5.378 u[IU]/mL — ABNORMAL HIGH (ref 0.350–4.500)

## 2015-08-24 LAB — OSMOLALITY: OSMOLALITY: 251 mosm/kg — AB (ref 275–295)

## 2015-08-24 LAB — OSMOLALITY, URINE: Osmolality, Ur: 584 mOsm/kg (ref 300–900)

## 2015-08-24 LAB — TROPONIN I

## 2015-08-24 MED ORDER — SODIUM CHLORIDE 0.9 % IV SOLN
INTRAVENOUS | Status: DC
  Administered 2015-08-24: 09:00:00 via INTRAVENOUS

## 2015-08-24 NOTE — Progress Notes (Signed)
*  PRELIMINARY RESULTS* Echocardiogram 2D Echocardiogram has been performed.  Laura HousekeeperJerry R [name] 08/24/2015, 2:05 PM

## 2015-08-24 NOTE — Progress Notes (Signed)
Patient ID: [name], female   DOB: [birth year], 81 y.o.   MRN: [id number] [facility] [place] [facility]   PATIENT NAME: [name]    MR#:  [id number]  DATE OF BIRTH:  [birth year]  SUBJECTIVE:  Feels very weak pain in her neck  REVIEW OF SYSTEMS:   Review of Systems  Constitutional: Negative for fever, chills and weight loss.  HENT: Negative for ear discharge, ear pain and nosebleeds.   Eyes: Negative for blurred vision, pain and discharge.  Respiratory: Negative for sputum production, shortness of breath, wheezing and stridor.   Cardiovascular: Negative for chest pain, palpitations, orthopnea and PND.  Gastrointestinal: Negative for nausea, vomiting, abdominal pain and diarrhea.  Genitourinary: Negative for urgency and frequency.  Musculoskeletal: Negative for back pain and joint pain.  Neurological: Negative for sensory change, speech change, focal weakness and weakness.  Psychiatric/Behavioral: Negative for depression and hallucinations. The patient is not nervous/anxious.    Tolerating Diet:yes Tolerating PT: pending  DRUG ALLERGIES:   Allergies  Allergen Reactions  . Strawberry Extract Anaphylaxis    Pt states her throat starts to close.   . Adhesive [Tape] Other (See Comments)    unknown  . Ciprofloxacin Other (See Comments)    unknown  . Codeine Other (See Comments)    unknown  . Furosemide Other (See Comments)    unknown  . Macrodantin [Nitrofurantoin Macrocrystal] Nausea Only  . Penicillins Hives  . Sulfa Antibiotics Other (See Comments)    unknown  . Valium [Diazepam] Other (See Comments)    Altered mental status  . Ziac [Bisoprolol-Hydrochlorothiazide] Other (See Comments)    unknown    VITALS:  Blood pressure 120/70, pulse 94, temperature 97.9 F (36.6 C), temperature source Oral, resp. rate 20, height  (1.575 m), weight 72.031 kg (158 lb 12.8 oz), SpO2 98 %.  PHYSICAL EXAMINATION:   Physical  Exam  GENERAL:  81 y.o.-year-old patient lying in the bed with no acute distress.  EYES: Pupils equal, round, reactive to light and accommodation. No scleral icterus. Extraocular muscles intact.  HEENT: Head atraumatic, normocephalic. Oropharynx and nasopharynx clear.  NECK:  Supple, no jugular venous distention. No thyroid enlargement, no tenderness. Mild redness anterior part of the neck LUNGS: Normal breath sounds bilaterally, no wheezing, rales, rhonchi. No use of accessory muscles of respiration.  CARDIOVASCULAR: S1, S2 normal. No murmurs, rubs, or gallops.  ABDOMEN: Soft, nontender, nondistended. Bowel sounds present. No organomegaly or mass.  EXTREMITIES: No cyanosis, clubbing or edema b/l.    NEUROLOGIC: Cranial nerves II through XII are intact. No focal Motor or sensory deficits b/l.   PSYCHIATRIC: The patient is alert and oriented x 3.  SKIN: No obvious rash, lesion, or ulcer.    LABORATORY PANEL:   CBC  Recent Labs Lab Sep 02, 2015 1726  WBC 6.1  HGB 12.2  HCT 37.3  PLT 300    Chemistries   Recent Labs Lab 2015/09/02 1136 Sep 02, 2015 1726  08/24/15 1114  NA 120* 120*  < > 120*  K 4.3  --   --   --   CL 87*  --   --   --   CO2 26  --   --   --   GLUCOSE 126*  --   --   --   BUN 11  --   --   --   CREATININE 0.51 0.48  --   --   CALCIUM 9.2  --   --   --  AST 27  --   --   --   ALT 49  --   --   --   ALKPHOS 59  --   --   --   BILITOT 0.4  --   --   --   < > = values in this interval not displayed.  Cardiac Enzymes  Recent Labs Lab 08/24/15 0513  TROPONINI <0.03    RADIOLOGY:  Dg Chest 2 View  08/05/2015  CLINICAL DATA:  Dyspnea.  Nonproductive cough.  Chest tightness . EXAM: CHEST  2 VIEW COMPARISON:  08/20/2015 chest radiograph FINDINGS: Stable moderate eventration of the right hemidiaphragm. Stable cardiomediastinal silhouette with mild cardiomegaly. No pneumothorax. No pleural effusion. Stable mild curvilinear opacities at both lung bases, in keeping  with mild bibasilar atelectasis. No pulmonary edema. Multiple thoracolumbar spine compression deformities are noted status post vertebroplasty. IMPRESSION: Stable chest radiograph with mild bibasilar atelectasis and chronic right hemidiaphragmatic eventration. Electronically Signed   By: Delbert Phenix M.D.   On: 08/08/2015 11:38   Ct Soft Tissue Neck Wo Contrast  07/29/2015  CLINICAL DATA:  Facial swelling around mandible and extending into anterior neck for the last 3 days. EXAM: CT NECK WITHOUT CONTRAST TECHNIQUE: Multidetector CT imaging of the neck was performed following the standard protocol without intravenous contrast. COMPARISON:  None. FINDINGS: Both submandibular salivary glands appear relatively enlarged and edematous with surrounding inflammatory changes. Inflammation extends out towards the skin and is slightly more prominent on the right compared to the left. Findings are suggestive of cellulitis involving the submandibular glands. No focal abscess identified. No obstructing salivary calculi or foreign bodies are identified by CT. The parotid glands appear normal. No focal masses or enlarged lymph nodes are seen. The visualized airway is normally patent. Paranasal sinuses and mastoid air cells are normally aerated. Diffuse degenerative disease noted of the cervical spine. There is a small nodule in the periphery of the left upper lobe abutting the lateral aspect of the major fissure and measuring approximately 4 x 6 mm. IMPRESSION: 1. Submandibular region cellulitis with inflammatory changes also seen involving both submandibular salivary glands. No focal abscess. 2. Incidental 4 x 6 mm left upper lobe pulmonary nodule. If the patient is at high risk for bronchogenic carcinoma, follow-up chest CT at 6-12 months is recommended. If the patient is at low risk for bronchogenic carcinoma, follow-up chest CT at 12 months is recommended. This recommendation follows the consensus statement: Guidelines for  Management of Small Pulmonary Nodules Detected on CT Scans: A Statement from the Fleischner Society as published in Radiology 2005;237:395-400. Electronically Signed   By: Irish Lack M.D.   On: 08/07/2015 16:51   Ct Chest Wo Contrast  08/24/2015  CLINICAL DATA:  81 year old female -followup pulmonary nodules. EXAM: CT CHEST WITHOUT CONTRAST TECHNIQUE: Multidetector CT imaging of the chest was performed following the standard protocol without IV contrast. COMPARISON:  03/24/2015 chest CT FINDINGS: Mediastinum/Nodes: Cardiomegaly and coronary/thoracic aortic atherosclerotic calcifications noted. No enlarged lymph nodes or pericardial effusion identified. Lungs/Pleura: Multiple bilateral pulmonary nodules are unchanged. Index nodules are as follows: 11 mm posteriomedial right lower lobe nodule (image 39) 5 mm right lower lobe nodule (image 30) 5 mm left upper lobe nodule (image 15) No new or enlarging pulmonary nodules are identified. There is no evidence of airspace disease, mass, consolidation or endobronchial lesion. Minimal dependent and basilar atelectasis/scarring again noted. There is no evidence of pleural effusion Upper abdomen: Patient is status post cholecystectomy. A moderate hiatal hernia is  again noted. Musculoskeletal: No acute abnormalities. Thoracic compression fractures and vertebral augmentation changes are again identified. IMPRESSION: Stable bilateral pulmonary nodules. Consider six-month CT followup to ensure 1 year stability. No evidence of acute abnormality within the chest. Moderate hiatal hernia, cardiomegaly, coronary disease and thoracic compression fractures/vertebral augmentation changes again noted. Electronically Signed   By: Harmon PierJeffrey  Hu M.D.   On: 08/24/2015 12:46     ASSESSMENT AND PLAN:   1. Acute hyponatremia probably 2/2 to dehydration,?chronic adrenal insuff. Pt on cortef 10 mg at home IVfluid Check urine and serum osmolarity. Also ordered random urine sodium,  creatinine and urine osmolarity Nephrology consult appreciated  2. Acute Sialadenitis IV clindamycin  -appreciate ENT eval  3. Recent diagnosis of Escherichia coli UTI sensitive to ciprofloxacin Continue by mouth ciprofloxacin for a total of 2 more days  4. History of hypothyroidism continue Synthroid and check TSH in a.m.  5. Essential hypertension-continue Lopressor her home medication and titrate as needed basis  6. Pulmonary nodule-outpatient follow-up with primary care physician for further evaluation Repeat CT shows stable nodules. From [facility] 2016 ?pulmoary eval  Case discussed with Care Management/Social Worker. Management plans discussed with the patient, family and they are in agreement.  CODE STATUS: full  DVT Prophylaxis: lovenox  TOTAL TIME TAKING CARE OF THIS PATIENT: 30 minutes.  >50% time spent on counselling and coordination of care  POSSIBLE D/C IN 1-2 DAYS, DEPENDING ON CLINICAL CONDITION.   [name] M.D on 08/24/2015 at 3:47 PM  Between 7am to 6pm - Pager - [phone]  After 6pm go to [email or web address] - password [email or web address] [facility]  [facility]  Office  [phone]  CC: Primary care physician; Danella PentonMILLER,Laura F., MD

## 2015-08-24 NOTE — Progress Notes (Signed)
PT Cancellation Note  Patient Details Name: Laura LoraFrances D Pulliam MRN: [id number] DOB: 04-24-1929   Cancelled Treatment:    Reason Eval/Treat Not Completed: Patient declined, no reason specified. PT attempted to see patient in PM, patient states she is just really not feeling good and would prefer to try ambulation tomorrow. PT will continue to follow.   Kerin RansomPatrick A McNamara, PT, DPT    08/24/2015, 4:02 PM

## 2015-08-24 NOTE — Consult Note (Signed)
[name], [name] [id number] [birth year] Laura FinnerSona Patel, MD  Reason for Consult: Sialoadenitis  HPI: 81 year old female admitted to the hospital for hyponatremia complaining of neck pain and swelling worsened left than the right. CT scan obtained showed sialadenitis of the left submandibular gland with some mild overlying cellulitis. She's been diagnosed with hyponatremia and dehydration has been on fluid restriction and salt tablets. The neck pain and swelling has been present for approximately 48 hours.  Allergies:  Allergies  Allergen Reactions  . Strawberry Extract Anaphylaxis    Pt states her throat starts to close.   . Adhesive [Tape] Other (See Comments)    unknown  . Ciprofloxacin Other (See Comments)    unknown  . Codeine Other (See Comments)    unknown  . Furosemide Other (See Comments)    unknown  . Macrodantin [Nitrofurantoin Macrocrystal] Nausea Only  . Penicillins Hives  . Sulfa Antibiotics Other (See Comments)    unknown  . Valium [Diazepam] Other (See Comments)    Altered mental status  . Ziac [Bisoprolol-Hydrochlorothiazide] Other (See Comments)    unknown    ROS: Review of systems normal other than 12 systems except per HPI.  PMH:  Past Medical History  Diagnosis Date  . Asthma   . Hypertension   . Arthritis   . Seizures (HCC)     FH:  Family History  Problem Relation Age of Onset  . Breast cancer Mother   . Brain cancer Father     SH:  Social History   Social History  . Marital Status: Widowed    Spouse Name: N/A  . Number of Children: N/A  . Years of Education: N/A   Occupational History  . Not on file.   Social History Main Topics  . Smoking status: Never Smoker   . Smokeless tobacco: Never Used  . Alcohol Use: No  . Drug Use: No  . Sexual Activity: Not Currently   Other Topics Concern  . Not on file   Social History Narrative    PSH:  Past Surgical History  Procedure Laterality Date  . Joint replacement Left   . Cardiac  catheterization    . Back surgery    . Kyphoplasty N/A 04/29/2015    Procedure: KYPHOPLASTY  T-12, T-10, T-7;  Surgeon: Kennedy BuckerMichael Menz, MD;  Location: [facility] ORS;  Service: Orthopedics;  Laterality: N/A;    Physical  Exam: Significant facial swelling and neck swelling with a cushingoid appearance. Examination of floor mouth showed no evidence of submandibular duct stone palpation the neck some tenderness worse in the left submandibular region no evidence of crepitus fluctuation or abscess. CN 2-12 grossly intact and symmetric. EAC/TMs normal BL. Oral cavity, lips, gums, ororpharynx normal with no masses or lesions. Skin warm and dry. Nasal cavity without polyps or purulence. External nose and ears without masses or lesions. EOMI, PERRLA. Thyroid normal with no masses.  I have reviewed her CT scan showed no evidence of abscess swelling of the submandibular glands and surrounding tissue consistent with submandibular gland sialadenitis.  A/P: Sialadenitis-recommend continue the clindamycin intravenously while in the hospital would also recommend Decadron 10 mg IV every 8 hours. I would recommend upon discharge to home by mouth clindamycin and a 6 day double strength Sterapred taper. I'm happy to follow-up as an outpatient with her however if her exam returns to normal that may not be necessary.   [name] T 08/24/2015 7:23 AM

## 2015-08-24 NOTE — Progress Notes (Signed)
PT Cancellation Note  Patient Details Name: Wyvonnia LoraFrances D Borrelli MRN: [id number] DOB: 06/22/1929   Cancelled Treatment:    Reason Eval/Treat Not Completed: Patient at procedure or test/unavailable. PT reviewed chart and entered room, however patient is off the floor for chest CT. PT will re-attempt as patient becomes available/appropriate.   Kerin RansomPatrick A McNamara, PT, DPT    08/24/2015, 10:25 AM

## 2015-08-24 NOTE — Progress Notes (Signed)
Continues on antibiotics ,vital signs within normal limits

## 2015-08-24 NOTE — Progress Notes (Signed)
Subjective:   Patient is a 81 year old Caucasian female. She is known to our practice from outpatient. She is followed by Dr. Wynelle Link for chronic hyponatremia and hypertension Her hyponatremia is presumed to be secondary to adrenal insufficiency She takes hydrocortisone chronically for that   Objective:  Vital signs in last 24 hours:  Temp:  [97.7 F (36.5 C)-98.4 F (36.9 C)] 98.4 F (36.9 C) (11/01 0433) Pulse Rate:  [49-83] 83 (11/01 0725) Resp:  [16-38] 20 (11/01 0725) BP: (121-224)/(54-169) 169/71 mmHg (11/01 0725) SpO2:  [90 %-96 %] 91 % (11/01 0725) Weight:  [72.031 kg (158 lb 12.8 oz)] 72.031 kg (158 lb 12.8 oz) (11/01 0433)  Weight change:  Filed Weights   08/18/2015 1103 08/24/15 0433  Weight: 75.297 kg (166 lb) 72.031 kg (158 lb 12.8 oz)    Intake/Output:    Intake/Output Summary (Last 24 hours) at 08/24/15 1155 Last data filed at 08/24/15 0810  Gross per 24 hour  Intake    150 ml  Output    450 ml  Net   -300 ml     Physical Exam: General:  no acute distress, cushingoid appearance   HEENT  redness over the upper neck, decreased hearing   Neck  supple   Pulm/lungs  bilateral diffuse crackles   CVS/Heart  irregular rhythm, no rub, soft systolic murmur   Abdomen:   obese, soft, nontender, nondistended   Extremities:  no peripheral edema   Neurologic:  alert,   Skin:  redness as described above   Access:        Basic Metabolic Panel:   Recent Labs Lab 08/19/15 1213 08/20/15 2210 07/29/2015 1136 08/16/2015 1726 08/24/15 0006 08/24/15 0513 08/24/15 1114  NA 128* 130* 120* 120* 120* 121* 120*  K 4.4 4.6 4.3  --   --   --   --   CL 93* 98* 87*  --   --   --   --   CO2 --   --   --   --   GLUCOSE 115* 131* 126*  --   --   --   --   BUN --   --   --   --   CREATININE 0.56 0.65 0.51 0.48  --   --   --   CALCIUM 9.6 8.9 9.2  --   --   --   --      CBC:  Recent Labs Lab 08/19/15 1213 08/20/15 2210 07/25/2015 1136  08/11/2015 1726  WBC 10.3 8.5 8.3 6.1  NEUTROABS 8.7* 6.9* 6.8*  --   HGB 13.1 11.8* 12.2 12.2  HCT 39.4 36.2 35.6 37.3  MCV 92.6 93.5 92.2 93.3  PLT 335 308 302 300      Microbiology:  Recent Results (from the past 720 hour(s))  Urine culture     Status: None   Collection Time: 08/20/15 10:11 PM  Result Value Ref Range Status   Specimen Description URINE, RANDOM  Final   Special Requests NONE  Final   Culture >=100,000 COLONIES/mL ESCHERICHIA COLI  Final   Report Status 08/17/2015 FINAL  Final   Organism ID, Bacteria ESCHERICHIA COLI  Final      Susceptibility   Escherichia coli - MIC*    AMPICILLIN 4 SENSITIVE Sensitive     CEFTAZIDIME <=1 SENSITIVE Sensitive     CEFAZOLIN <=4 SENSITIVE Sensitive     CEFTRIAXONE <=1 SENSITIVE Sensitive     CIPROFLOXACIN <=0.25  SENSITIVE Sensitive     GENTAMICIN <=1 SENSITIVE Sensitive     IMIPENEM <=0.25 SENSITIVE Sensitive     TRIMETH/SULFA <=20 SENSITIVE Sensitive     PIP/TAZO Value in next row Sensitive      SENSITIVE<=4    * >=100,000 COLONIES/mL ESCHERICHIA COLI    Coagulation Studies: No results for input(s): LABPROT, INR in the last 72 hours.  Urinalysis:  Recent Labs  08/03/15 1208  COLORURINE YELLOW*  LABSPEC 1.015  PHURINE 7.0  GLUCOSEU NEGATIVE  HGBUR NEGATIVE  BILIRUBINUR NEGATIVE  KETONESUR NEGATIVE  PROTEINUR 30*  NITRITE NEGATIVE  LEUKOCYTESUR NEGATIVE      Imaging: Dg Chest 2 View  2015/02/14  CLINICAL DATA:  Dyspnea.  Nonproductive cough.  Chest tightness . EXAM: CHEST  2 VIEW COMPARISON:  08/20/2015 chest radiograph FINDINGS: Stable moderate eventration of the right hemidiaphragm. Stable cardiomediastinal silhouette with mild cardiomegaly. No pneumothorax. No pleural effusion. Stable mild curvilinear opacities at both lung bases, in keeping with mild bibasilar atelectasis. No pulmonary edema. Multiple thoracolumbar spine compression deformities are noted status post vertebroplasty. IMPRESSION: Stable  chest radiograph with mild bibasilar atelectasis and chronic right hemidiaphragmatic eventration. Electronically Signed   By: Delbert PhenixJason A Poff M.D.   On: 2015/02/14 11:38   Ct Soft Tissue Neck Wo Contrast  2015/02/14  CLINICAL DATA:  Facial swelling around mandible and extending into anterior neck for the last 3 days. EXAM: CT NECK WITHOUT CONTRAST TECHNIQUE: Multidetector CT imaging of the neck was performed following the standard protocol without intravenous contrast. COMPARISON:  None. FINDINGS: Both submandibular salivary glands appear relatively enlarged and edematous with surrounding inflammatory changes. Inflammation extends out towards the skin and is slightly more prominent on the right compared to the left. Findings are suggestive of cellulitis involving the submandibular glands. No focal abscess identified. No obstructing salivary calculi or foreign bodies are identified by CT. The parotid glands appear normal. No focal masses or enlarged lymph nodes are seen. The visualized airway is normally patent. Paranasal sinuses and mastoid air cells are normally aerated. Diffuse degenerative disease noted of the cervical spine. There is a small nodule in the periphery of the left upper lobe abutting the lateral aspect of the major fissure and measuring approximately 4 x 6 mm. IMPRESSION: 1. Submandibular region cellulitis with inflammatory changes also seen involving both submandibular salivary glands. No focal abscess. 2. Incidental 4 x 6 mm left upper lobe pulmonary nodule. If the patient is at high risk for bronchogenic carcinoma, follow-up chest CT at 6-12 months is recommended. If the patient is at low risk for bronchogenic carcinoma, follow-up chest CT at 12 months is recommended. This recommendation follows the consensus statement: Guidelines for Management of Small Pulmonary Nodules Detected on CT Scans: A Statement from the Fleischner Society as published in Radiology 2005;237:395-400. Electronically  Signed   By: Irish LackGlenn  Yamagata M.D.   On: 2015/02/14 16:51     Medications:     . aspirin EC  81 mg Oral Daily  . benzonatate  100 mg Oral BID  . cholecalciferol  2,000 Units Oral Daily  . ciprofloxacin  250 mg Oral BID  . clindamycin (CLEOCIN) IV  600 mg Intravenous 3 times per day  . docusate sodium  100 mg Oral BID  . enoxaparin (LOVENOX) injection  40 mg Subcutaneous Q24H  . ferrous sulfate  325 mg Oral Once per day on Mon Wed Fri   And  . vitamin C  250 mg Oral Once per day on Mon Wed Fri  .  furosemide  40 mg Intravenous Once  . hydrALAZINE  50 mg Oral BID  . HYDROcodone-acetaminophen  0.5 tablet Oral TID  . hydrocortisone  10 mg Oral BID  . irbesartan  37.5 mg Oral Daily  . levothyroxine  125 mcg Oral QAC breakfast  . lidocaine  1 patch Transdermal Daily  . magnesium oxide  400 mg Oral Daily  . metoprolol  100 mg Oral BID  . montelukast  10 mg Oral QHS  . ondansetron  4 mg Oral TID  . pantoprazole  40 mg Oral BID  . polyethylene glycol  17 g Oral Daily  . pramipexole  0.25 mg Oral TID  . sodium chloride  1 g Oral BID  . sucralfate  1 g Oral TID AC   acetaminophen **OR** acetaminophen, HYDROcodone-acetaminophen, loperamide, magnesium hydroxide, ondansetron **OR** ondansetron (ZOFRAN) IV, ondansetron  Assessment/ Plan:  81 y.o. female With history of hypo-thyroidism, hypertension, degenerative joint disease, depression, TIAs, GERD, irritable bowel syndrome, restless leg syndrome, coronary disease, hyponatremia and mild renal insufficiency  1. Hyponatremia Chronic problem but her level was 133 back in August We will obtain urine studies and urine osmolality Patient does not seem to be hypovolemic, therefore, we'll discontinue normal saline infusion Patient has history of pulmonary nodules We will obtain a noncontrast CT to further evaluate She may need pulmonary consultation Not clear about history of hypo-adrenalism    LOS: 1 [name] 11/1/201611:55  AM

## 2015-08-24 DEATH — deceased

## 2015-08-25 LAB — BASIC METABOLIC PANEL
Anion gap: 6 (ref 5–15)
BUN: 13 mg/dL (ref 6–20)
CALCIUM: 8.5 mg/dL — AB (ref 8.9–10.3)
CO2: 28 mmol/L (ref 22–32)
CREATININE: 0.63 mg/dL (ref 0.44–1.00)
Chloride: 86 mmol/L — ABNORMAL LOW (ref 101–111)
GFR calc Af Amer: >60 mL/min (ref >60)
Glucose, Bld: 123 mg/dL — ABNORMAL HIGH (ref 65–99)
Potassium: 4.1 mmol/L (ref 3.5–5.1)
SODIUM: 120 mmol/L — AB (ref 135–145)

## 2015-08-25 MED ORDER — SODIUM CHLORIDE 0.9 % IV SOLN
INTRAVENOUS | Status: DC
  Administered 2015-08-25 – 2015-08-26 (×3): via INTRAVENOUS
  Administered 2015-08-27: 100 mL/h via INTRAVENOUS

## 2015-08-25 MED ORDER — LEVOTHYROXINE SODIUM 75 MCG PO TABS
150.0000 ug | ORAL_TABLET | Freq: Every day | ORAL | Status: DC
  Administered 2015-08-26 – 2015-08-27 (×2): 150 ug via ORAL
  Filled 2015-08-25: qty 2
  Filled 2015-08-25: qty 1

## 2015-08-25 NOTE — Consult Note (Addendum)
Endocrine Initial Consult Note Date of Consult: 08/25/2015  Consulting Service: [facility] Endocrinology  MD Requesting Consult: Delfino Lovett  SUBJECTIVE: Reason for Consultation: abnormal TSH, facial/neck swelling   History of Present Illness: [name] is a 81 y.o. female with PMH HTN, Postablative hypothyroidism, and chronic hyponatremia thought to be secondary to adrenal insufficiency admitted with generalized weakness and altered mental status. Her sodium level had declined from 130 range to 120 upon presentation despite free water restriction and taking 2 salt tablets a day per Dr. Rondel Baton instruction. Endocrinology is consulted regarding mildly elevated TSH and facial/neck swelling.   She is followed by Dr. Tedd Sias for her chronic hyponatremia and adrenal insufficiency. She was last seen 05/2014 and referred to Nephrology for ongoing management of the hyponatremia which was persistent despite maximizing steroid replacement regimen. Her AI is thought to be due to a combination of SIADH and adrenal insufficiency. She is on hydrocortisone 10 mg twice daily at home. Of note, her AI is presumably secondary to exogenous steroid use. She took prednisone for chronic back pain and had epidural steroid injections.  The patient was diagnosed with hyperthyroidism and underwent radioactive iodine ablation around 1970. Since then she has been on thyroid hormone replacement for post-ablative hypothryoidism. She currently takes 125 mcg daily in the appropriate way. She reports adherence with therapy. TSH was wnl three weeks ago outpatient. She has had no recent changes to her regimen.     Upon ROS, she endorses nausea and vomiting daily, especially in the morning. This has been a chronic issue unchanged by steroid replacement. Shortness of breath has been an issue in the past month. No associated chest pain. She has also had swelling in the face and neck in the past 10 days. Also she has had weight gain  in the past year.   Patient Active Problem List   Diagnosis Date Noted  . Acute hyponatremia August 24, 2015     Past Medical History  Diagnosis Date  . Asthma   . Hypertension   . Arthritis   . Seizures [facility])    Past Surgical History  Procedure Laterality Date  . Joint replacement Left   . Cardiac catheterization    . Back surgery    . Kyphoplasty N/A 04/29/2015    Procedure: KYPHOPLASTY  T-12, T-10, T-7;  Surgeon: Kennedy Bucker, MD;  Location: [facility] ORS;  Service: Orthopedics;  Laterality: N/A;   Family History  Problem Relation Age of Onset  . Breast cancer Mother   . Brain cancer Father     Social History:  Social History  Substance Use Topics  . Smoking status: Never Smoker   . Smokeless tobacco: Never Used  . Alcohol Use: No    Allergies  Allergen Reactions  . Strawberry Extract Anaphylaxis    Pt states her throat starts to close.   . Adhesive [Tape] Other (See Comments)    unknown  . Ciprofloxacin Other (See Comments)    unknown  . Codeine Other (See Comments)    unknown  . Furosemide Other (See Comments)    unknown  . Macrodantin [Nitrofurantoin Macrocrystal] Nausea Only  . Penicillins Hives  . Sulfa Antibiotics Other (See Comments)    unknown  . Valium [Diazepam] Other (See Comments)    Altered mental status  . Ziac [Bisoprolol-Hydrochlorothiazide] Other (See Comments)    unknown     Medications:  No current facility-administered medications on file prior to encounter.   Current Outpatient Prescriptions on File Prior to Encounter  Medication Sig Dispense Refill  . acetaminophen (TYLENOL) 325 MG tablet Take 325 mg by mouth every 8 (eight) hours.     Marland Kitchen acetaminophen (TYLENOL) 325 MG tablet Take 650 mg by mouth every 4 (four) hours as needed for mild pain, moderate pain, fever or headache.    . benzonatate (TESSALON) 100 MG capsule Take 100 mg by mouth 2 (two) times daily.    . Biotin 1000 MCG tablet Take 1,000 mcg by mouth daily.     . Cholecalciferol  (VITAMIN D) 2000 UNITS tablet Take 2,000 Units by mouth daily.    . ciprofloxacin (CIPRO) 250 MG tablet Take 1 tablet (250 mg total) by mouth 2 (two) times daily. 14 tablet 0  . hydrALAZINE (APRESOLINE) 50 MG tablet Take 50 mg by mouth 2 (two) times daily.    Marland Kitchen HYDROcodone-acetaminophen (NORCO/VICODIN) 5-325 MG per tablet Take 0.5 tablets by mouth 3 (three) times daily.     Marland Kitchen HYDROcodone-acetaminophen (NORCO/VICODIN) 5-325 MG tablet Take 1 tablet by mouth every 6 (six) hours as needed for moderate pain or severe pain.    . hydrocortisone (CORTEF) 10 MG tablet Take 10 mg by mouth 2 (two) times daily.    . Iron-Vitamin C (VITRON-C) 65-125 MG TABS Take 1 tablet by mouth 3 (three) times a week. Take Monday - Wednesday - Friday    . levothyroxine (SYNTHROID, LEVOTHROID) 125 MCG tablet Take 125 mcg by mouth daily.     Marland Kitchen lidocaine (LIDODERM) 5 % Place 1 patch onto the skin daily. Remove & Discard patch within 12 hours or as directed by MD    . loperamide (IMODIUM) 2 MG capsule Take 2 mg by mouth 2 (two) times daily as needed for diarrhea or loose stools.     . magnesium hydroxide (MILK OF MAGNESIA) 400 MG/5ML suspension Take 45 mLs by mouth daily as needed for mild constipation or moderate constipation.     . magnesium oxide (MAG-OX) 400 MG tablet Take 400 mg by mouth daily.    . metoprolol (LOPRESSOR) 100 MG tablet Take 100 mg by mouth 2 (two) times daily.    . montelukast (SINGULAIR) 10 MG tablet Take 10 mg by mouth at bedtime.    Marland Kitchen olmesartan (BENICAR) 40 MG tablet Take 40 mg by mouth daily.    . ondansetron (ZOFRAN) 4 MG tablet Take 1 tablet (4 mg total) by mouth every 8 (eight) hours as needed for nausea or vomiting. 20 tablet 1  . ondansetron (ZOFRAN-ODT) 4 MG disintegrating tablet Take 4 mg by mouth 3 (three) times daily.     . pantoprazole (PROTONIX) 40 MG tablet Take 40 mg by mouth 2 (two) times daily. For 10 days then back down to 1 tablet daily    . polyethylene glycol (MIRALAX / GLYCOLAX)  packet Take 17 g by mouth daily. Drink with water    . pramipexole (MIRAPEX) 0.25 MG tablet Take 0.25 mg by mouth 3 (three) times daily.    . sodium chloride 1 G tablet Take 1 g by mouth 2 (two) times daily.    . sucralfate (CARAFATE) 1 G tablet Take 1 g by mouth 3 (three) times daily before meals.     Review of Systems: As in HPI. Remainder of the 10 pt ROS was negative.  OBJECTIVE: Temp:  [97.5 F (36.4 C)-98.4 F (36.9 C)] 97.5 F (36.4 C) (11/02 1148) Pulse Rate:  [65-76] 71 (11/02 1148) Resp:  [20-24] 24 (11/02 1148) BP: (120-168)/(69-91) 120/91 mmHg (11/02 1148) SpO2:  [90 %-  95 %] 91 % (11/02 1148) Weight:  [71.85 kg (158 lb 6.4 oz)] 71.85 kg (158 lb 6.4 oz) (11/02 0445)  Temp (24hrs), Avg:98 F (36.7 C), Min:97.5 F (36.4 C), Max:98.4 F (36.9 C)  Weight: 71.85 kg (158 lb 6.4 oz)  Physical Exam: Gen: lying in bed sleepy but arousable; appearing cushingnoid Neuro: mentating well and AAOx3, answering all questions appropriately but falling asleep mid-sentence Face: rounded moon facies, no plethora HEENT: NCAT, eyes anicteric, PERRL, oral mucosa moist Neck: obese, no thyromegaly  CVS: RRR s1, s2 Pulm: clear to auscultation anteriorly, tachypneic on room air, shallow breathing Extr: thin extremities, muscular atrophy, no edema Abd: obese, no striae, soft, nontender Skin: thin, no tenting Psych: normal affect, good insight into her medical condition  BMP Latest Ref Rng 08/25/2015 08/24/2015 08/24/2015  Glucose 65 - 99 mg/dL 161(W123(H) - -  BUN 6 - 20 mg/dL 13 - -  Creatinine 9.600.44 - 1.00 mg/dL 4.540.63 - -  Sodium 098135 - 145 mmol/L 120(L) 120(L) 121(L)  Potassium 3.5 - 5.1 mmol/L 4.1 - -  Chloride 101 - 111 mmol/L 86(L) - -  CO2 22 - 32 mmol/L 28 - -  Calcium 8.9 - 10.3 mg/dL 1.1(B8.5(L) - -    CBC Latest Ref Rng 2015-01-24 2015-01-24 08/20/2015  WBC 3.6 - 11.0 K/uL 6.1 8.3 8.5  Hemoglobin 12.0 - 16.0 g/dL 14.712.2 82.912.2 11.8(L)  Hematocrit 35.0 - 47.0 % 37.3 35.6 36.2  Platelets 150  - 440 K/uL 300 302 308   Component     Latest Ref Rng 2015-01-24 08/24/2015  Osmolality, Ur     300 - 900 mOsm/kg 584   Osmolality     275 - 295 mOsm/kg  251 (L)   Component     Latest Ref Rng 08/24/2015  TSH     0.350 - 4.500 uIU/mL 5.378 (H)   ASSESSMENT:   1. Hypothyroidism with mildly elevated TSH 2. Hyponatremia - known SIADH  3. Adrenal insufficiency   RECOMMENDATIONS:  1. TFT abnormalities can be difficult to interpret in the inpatient setting. It is unlikely that her neck swelling or hyponatremia are related to the mild degree of TSH elevation noted.  Would continue levothyroxine 125 mcg daily. TSH was 1.74 outpatient just 3 weeks ago and so biochemically she is euthyroid.  TSH should be repeated outpatient in 6 weeks.  2. With regard to her hyponatremia, the low serum osmolality is suggesting SIADH vs decreased effective intravascular volume (?heart failure) which in itself stimulates ADH release. She does not appear dehydrated, would consider discontinuing the normal saline. I do not think increasing the glucocorticoids would change much at this point.  Nephrology is following, salt tabs and lasix have been recommended. Agree with consideration of a vasopressin receptor antagonist like tolvaptan if no improvement in the serum sodium. Would continue the current regimen of hydrocortisone 10 mg bid.  Will follow along. Thank you for allowing me to participate in this patient's care.  Doylene CanningAbby Abisogun, MD [facility] Endocrinology

## 2015-08-25 NOTE — Progress Notes (Signed)
PT Cancellation Note  Patient Details Name: [name] MRN: [id number] DOB: 06-03-1929   Cancelled Treatment:    Reason Eval/Treat Not Completed: Other (comment) (See PT note for further details) PT consult attempted this morning, however pt stating she is too fatigued to participate and would like us to come back when she has more energy. Will attempt at later time/date when more appropriate.    Benna DunksCasey Etienne Mowers 08/25/2015, 10:25 AM  Benna Dunksasey Quantay Zaremba, SPT. [phone]

## 2015-08-25 NOTE — Evaluation (Signed)
Physical Therapy Evaluation Patient Details Name: [name] MRN: [id number] DOB: [birth year] Today's Date: 08/25/2015   History of Present Illness  presented to ER from ALF secondary to generalized weakness and confusion; admitted with hypervolemic hyponatremia (NA currently 120).  Clinical Impression  Upon evaluation, patient alert and oriented to basic information; follows all commands and displays appropriate conversation.  Bilat LE strength grossly WFL for basic transfers (3/5), but generally weak and deconditioned.  Currently requiring min assist for bed mobility and min assist for basic transfers (requiring UE support at all times).  Patient very fearful of transfers; unable to tolerate activity beyond transfers at this time due to fatigue. Would benefit from skilled PT to address above deficits and promote optimal return to PLOF; Recommend transition to HHPT upon discharge from acute hospitalization IF facility can provide +1 physical assist with ALL transfers; if not, may need to consider STR     Follow Up Recommendations Home health PT;Supervision/Assistance - 24 hour (IF facility can provide +1 physical assist with ALL transfers; if not, may need to consider STR)    Equipment Recommendations       Recommendations for Other Services       Precautions / Restrictions Precautions Precautions: Fall Restrictions Weight Bearing Restrictions: No      Mobility  Bed Mobility Overal bed mobility: Needs Assistance Bed Mobility: Supine to Sit     Supine to sit: Min assist        Transfers Overall transfer level: Needs assistance Equipment used: Rolling walker (2 wheeled) Transfers: Stand Pivot Transfers   Stand pivot transfers: Min assist;Mod assist       General transfer comment: requires UE support throughout transfer; patient very fearful.  Unsafe to attempt without +1 assist at this time.  Ambulation/Gait             General Gait Details: non-ambulatory  at baseline  Stairs            Wheelchair Mobility    Modified Rankin (Stroke Patients Only)       Balance Overall balance assessment: Needs assistance Sitting-balance support: No upper extremity supported;Feet supported Sitting balance-Leahy Scale: Good     Standing balance support: Bilateral upper extremity supported Standing balance-Leahy Scale: Fair                               Pertinent Vitals/Pain Pain Assessment: No/denies pain    Home Living Family/patient expects to be discharged to:: Assisted living               Home Equipment: Walker - 2 wheels;Wheelchair - manual      Prior Function Level of Independence: Independent with assistive device(s)         Comments: At baseline, able to complete bed mobility and basic transfers (SPT between seating surfaces) with mod indep; utilizes manual WC as primary mobility (able to self-propel with bilat UEs).  Does endorse functional decline related to this episode, requiring assist for all transfers/mobility immediate prior to admission.  Denies fall in previous six months.  Was participating in PT at facility; walking short distances with RW, +1 and WC folllow.     Hand Dominance        Extremity/Trunk Assessment               Lower Extremity Assessment: Generalized weakness (grossly 3/5 throughout, bilat ankles to neutral only)      Cervical / Trunk Assessment:  Kyphotic  Communication   Communication: No difficulties  Cognition Arousal/Alertness: Awake/alert Behavior During Therapy: WFL for tasks assessed/performed Overall Cognitive Status: Within Functional Limits for tasks assessed (patient continues to report feelings of confusion; not overtly apparent during session)                      General Comments      Exercises        Assessment/Plan    PT Assessment Patient needs continued PT services  PT Diagnosis Difficulty walking;Generalized weakness   PT  Problem List Decreased strength;Decreased range of motion;Decreased activity tolerance;Decreased balance;Decreased coordination;Decreased mobility;Decreased cognition;Decreased knowledge of use of DME;Decreased safety awareness;Decreased knowledge of precautions;Cardiopulmonary status limiting activity  PT Treatment Interventions DME instruction;Gait training;Stair training;Functional mobility training;Therapeutic activities;Therapeutic exercise;Balance training;Patient/family education   PT Goals (Current goals can be found in the Care Plan section) Acute Rehab PT Goals Patient Stated Goal: "to give it a try" PT Goal Formulation: With patient Time For Goal Achievement: 09/08/15 Potential to Achieve Goals: Fair    Frequency Min 2X/week   Barriers to discharge        Co-evaluation               End of Session Equipment Utilized During Treatment: Gait belt Activity Tolerance: Patient tolerated treatment well Patient left: in chair;with call bell/phone within reach;with chair alarm set Nurse Communication: Mobility status         Time: [id number] PT Time Calculation (min) (ACUTE ONLY): 14 min   Charges:   PT Evaluation $Initial PT Evaluation Tier I: 1 Procedure     PT G Codes:        Karolyne Timmons H. Manson PasseyBrown, PT, DPT, NCS 08/25/2015, 4:42 PM [phone]

## 2015-08-25 NOTE — Progress Notes (Signed)
Patient ID: GUADALUPE KEREKES, female   DOB: July 06, 1929, 80 y.o.   MRN: [id number] [facility] [place] [facility]   PATIENT NAME: [name]    MR#:  [id number]  DATE OF BIRTH:  13-Sep-1929  SUBJECTIVE:  Feels very weak, Na still 120 REVIEW OF SYSTEMS:   Review of Systems  Constitutional: Negative for fever, chills and weight loss.  HENT: Negative for ear discharge, ear pain and nosebleeds.   Eyes: Negative for blurred vision, pain and discharge.  Respiratory: Negative for sputum production, shortness of breath, wheezing and stridor.   Cardiovascular: Negative for chest pain, palpitations, orthopnea and PND.  Gastrointestinal: Negative for nausea, vomiting, abdominal pain and diarrhea.  Genitourinary: Negative for urgency and frequency.  Musculoskeletal: Negative for back pain and joint pain.  Neurological: Negative for sensory change, speech change, focal weakness and weakness.  Psychiatric/Behavioral: Negative for depression and hallucinations. The patient is not nervous/anxious.    Tolerating Diet:yes Tolerating PT: pending  DRUG ALLERGIES:   Allergies  Allergen Reactions  . Strawberry Extract Anaphylaxis    Pt states her throat starts to close.   . Adhesive [Tape] Other (See Comments)    unknown  . Ciprofloxacin Other (See Comments)    unknown  . Codeine Other (See Comments)    unknown  . Furosemide Other (See Comments)    unknown  . Macrodantin [Nitrofurantoin Macrocrystal] Nausea Only  . Penicillins Hives  . Sulfa Antibiotics Other (See Comments)    unknown  . Valium [Diazepam] Other (See Comments)    Altered mental status  . Ziac [Bisoprolol-Hydrochlorothiazide] Other (See Comments)    unknown    VITALS:  Blood pressure 120/91, pulse 71, temperature 97.5 F (36.4 C), temperature source Oral, resp. rate 24, height  (1.575 m), weight 71.85 kg (158 lb 6.4 oz), SpO2 91 %.  PHYSICAL EXAMINATION:   Physical Exam  GENERAL:  81  y.o.-year-old patient lying in the bed with no acute distress.  EYES: Pupils equal, round, reactive to light and accommodation. No scleral icterus. Extraocular muscles intact.  HEENT: Head atraumatic, normocephalic. Oropharynx and nasopharynx clear.  NECK:  Supple, no jugular venous distention. No thyroid enlargement, no tenderness. Mild redness anterior part of the neck LUNGS: Normal breath sounds bilaterally, no wheezing, rales, rhonchi. No use of accessory muscles of respiration.  CARDIOVASCULAR: S1, S2 normal. No murmurs, rubs, or gallops.  ABDOMEN: Soft, nontender, nondistended. Bowel sounds present. No organomegaly or mass.  EXTREMITIES: No cyanosis, clubbing or edema b/l.    NEUROLOGIC: Cranial nerves II through XII are intact. No focal Motor or sensory deficits b/l.   PSYCHIATRIC: The patient is alert and oriented x 3.  SKIN: No obvious rash, lesion, or ulcer.  LABORATORY PANEL:   CBC  Recent Labs Lab 08/20/2015 1726  WBC 6.1  HGB 12.2  HCT 37.3  PLT 300    Chemistries   Recent Labs Lab 08/13/2015 1136  08/25/15 1116  NA 120*  < > 120*  K 4.3  --  4.1  CL 87*  --  86*  CO2 26  --  28  GLUCOSE 126*  --  123*  BUN 11  --  13  CREATININE 0.51  < > 0.63  CALCIUM 9.2  --  8.5*  AST 27  --   --   ALT 49  --   --   ALKPHOS 59  --   --   BILITOT 0.4  --   --   < > =  values in this interval not displayed.  Cardiac Enzymes  Recent Labs Lab 08/24/15 0513  TROPONINI <0.03    RADIOLOGY:  Ct Soft Tissue Neck Wo Contrast  23-Apr-2015  CLINICAL DATA:  Facial swelling around mandible and extending into anterior neck for the last 3 days. EXAM: CT NECK WITHOUT CONTRAST TECHNIQUE: Multidetector CT imaging of the neck was performed following the standard protocol without intravenous contrast. COMPARISON:  None. FINDINGS: Both submandibular salivary glands appear relatively enlarged and edematous with surrounding inflammatory changes. Inflammation extends out towards the skin and  is slightly more prominent on the right compared to the left. Findings are suggestive of cellulitis involving the submandibular glands. No focal abscess identified. No obstructing salivary calculi or foreign bodies are identified by CT. The parotid glands appear normal. No focal masses or enlarged lymph nodes are seen. The visualized airway is normally patent. Paranasal sinuses and mastoid air cells are normally aerated. Diffuse degenerative disease noted of the cervical spine. There is a small nodule in the periphery of the left upper lobe abutting the lateral aspect of the major fissure and measuring approximately 4 x 6 mm. IMPRESSION: 1. Submandibular region cellulitis with inflammatory changes also seen involving both submandibular salivary glands. No focal abscess. 2. Incidental 4 x 6 mm left upper lobe pulmonary nodule. If the patient is at high risk for bronchogenic carcinoma, follow-up chest CT at 6-12 months is recommended. If the patient is at low risk for bronchogenic carcinoma, follow-up chest CT at 12 months is recommended. This recommendation follows the consensus statement: Guidelines for Management of Small Pulmonary Nodules Detected on CT Scans: A Statement from the Fleischner Society as published in Radiology 2005;237:395-400. Electronically Signed   By: Irish LackGlenn  Yamagata M.D.   On: 23-Apr-2015 16:51   Ct Chest Wo Contrast  08/24/2015  CLINICAL DATA:  81 year old female -followup pulmonary nodules. EXAM: CT CHEST WITHOUT CONTRAST TECHNIQUE: Multidetector CT imaging of the chest was performed following the standard protocol without IV contrast. COMPARISON:  03/24/2015 chest CT FINDINGS: Mediastinum/Nodes: Cardiomegaly and coronary/thoracic aortic atherosclerotic calcifications noted. No enlarged lymph nodes or pericardial effusion identified. Lungs/Pleura: Multiple bilateral pulmonary nodules are unchanged. Index nodules are as follows: 11 mm posteriomedial right lower lobe nodule (image 39) 5 mm  right lower lobe nodule (image 30) 5 mm left upper lobe nodule (image 15) No new or enlarging pulmonary nodules are identified. There is no evidence of airspace disease, mass, consolidation or endobronchial lesion. Minimal dependent and basilar atelectasis/scarring again noted. There is no evidence of pleural effusion Upper abdomen: Patient is status post cholecystectomy. A moderate hiatal hernia is again noted. Musculoskeletal: No acute abnormalities. Thoracic compression fractures and vertebral augmentation changes are again identified. IMPRESSION: Stable bilateral pulmonary nodules. Consider six-month CT followup to ensure 1 year stability. No evidence of acute abnormality within the chest. Moderate hiatal hernia, cardiomegaly, coronary disease and thoracic compression fractures/vertebral augmentation changes again noted. Electronically Signed   By: Harmon PierJeffrey  Hu M.D.   On: 08/24/2015 12:46   ASSESSMENT AND PLAN:   1. Acute hyponatremia probably due to SIADH, d/w nephro. Can try tolvaptan tomorrow if still remains low.  2. Acute Sialadenitis IV clindamycin  -appreciate ENT eval  3. Recent diagnosis of Escherichia coli UTI sensitive to ciprofloxacin Continue by mouth ciprofloxacin for a total of 2 more days  4. History of hypothyroidism: TSH high, increase the dose of Synthroid. Endocrine c/s  5. Essential hypertension-continue Lopressor her home medication and titrate as needed basis  6. Pulmonary nodule-outpatient follow-up  with primary care physician for further evaluation Repeat CT shows stable nodules. From July 2016 ?pulmoary eval  Case discussed with Care Management/Social Worker. Management plans discussed with the patient, family and they are in agreement.  CODE STATUS: full  DVT Prophylaxis: lovenox  TOTAL TIME TAKING CARE OF THIS PATIENT: 30 minutes.  >50% time spent on counselling and coordination of care (discussed with patient's son, Rosanne Ashing at [phone] also with her  daughter, Gavin Pound in [place] at [phone])  POSSIBLE D/C IN 1-2 DAYS, DEPENDING ON CLINICAL CONDITION and improvement in Na   [facility], Herschell Virani M.D on 08/25/2015 at 4:05 PM  Between 7am to 6pm - Pager - [phone]  After 6pm go to [email or web address] - password [email or web address] [facility]  [facility]  Office  [phone]  CC: Primary care physician; Danella Penton., MD

## 2015-08-25 NOTE — Clinical Social Work Note (Signed)
Clinical Social Work Assessment  Patient Details  Name: [name] MRN: [id number] Date of Birth: [birth year]  Date of referral:  08/25/15               Reason for consult:  Other (Comment Required) (From [facility] )                Permission sought to share information with:  Facility Art therapist granted to share information::  Yes, Verbal Permission Granted  Name::      [facility]   Agency::     Relationship::     Contact Information:     Housing/Transportation Living arrangements for the past 2 months:  [place] of Information:  Patient, Adult Children, Facility Patient Interpreter Needed:  None Criminal Activity/Legal Involvement Pertinent to Current Situation/Hospitalization:  No - Comment as needed Significant Relationships:  Adult Children Lives with:  Facility Resident Do you feel safe going back to the place where you live?  Yes Need for family participation in patient care:  Yes (Comment)  Care giving concerns: Patient is a resident at [facility].    Social Worker assessment / plan:  Holiday representative (CSW) met with patient to discuss D/C plan. Patient was laying bed and was alert and oriented. CSW introduced self and explained role of CSW department. Patient reported that she has been at [facility] for 1 year now. Per patient she primarily uses a wheel chair and could not stand up when she first moved into [place]. Patient reported that she had a pinched nerve her that debilitated her. Patient reported that she can transfer herself using the wheel chair. Patient reported that she was getting PT and OT at [facility]. Patient reported that she has 4 children, [name] lives in [place], [place] lives in [place], and she has a son in [place] and [place]. Patient reported that [name] is her HPOA. Patient reported that she wants to return to [facility] once her sodium gets better.   Per [facility]  administrator patient is wheel chair bound at baseline and can return to [facility]. Per Donnie Coffin is the home health agency they contract with. Per Horris Latino she came to [facility] today to visit patient.   CSW contacted patient's son [name]. Per [name] he is not HPOA and his brother [name] is. [name] is agreeable for "whatever is best for the patient."   FL2 complete.   Employment status:  Retired Forensic scientist:  Medicare PT Recommendations:  Not assessed at this time [place] / Referral to community resources:  Other (Comment Required) ([place] )  Patient/Family's Response to care: Patient is agreeable to returning to [facility].   Patient/Family's Understanding of and Emotional Response to Diagnosis, Current Treatment, and Prognosis: Patient was pleasant and thanked CSW for visit.   Emotional Assessment Appearance:  Appears stated age Attitude/Demeanor/Rapport:    Affect (typically observed):  Accepting, Adaptable, Pleasant Orientation:  Oriented to Self, Oriented to Place, Oriented to  Time, Oriented to Situation Alcohol / Substance use:  Not Applicable Psych involvement (Current and /or in the community):  No (Comment)  Discharge Needs  Concerns to be addressed:  Discharge Planning Concerns Readmission within the last 30 days:  No Current discharge risk:  Dependent with Mobility Barriers to Discharge:  Continued Medical Work up   Elwyn Reach 08/25/2015, 3:42 PM

## 2015-08-25 NOTE — Consult Note (Signed)
[facility] Cardiology Consultation Note  Patient ID: [name], MRN: [id number], DOB/AGE: 79-May-1930 79 y.o. Admit date: 09/04/2015   Date of Consult: 08/25/2015 Primary Physician: Danella Penton., MD Primary Cardiologist: None  Chief Complaint:  Chief Complaint  Patient presents with  . Abdominal Pain    x 2 weeks   Reason for Consult: HPI    Abdominal Pain   Additional comments: x 2 weeks     Last edited by Trilby Leaver, RN on 01-Sep-2015 11:02 AM. (History)     shortness of breath with coronary disease by CAT scan and concerns for diastolic heart failure  HPI: 81 y.o. female with essential hypertension coronary artery disease by CAT scan who has had appropriate medication management for these issues increasing in having shortness of breath with and without physical activity over the last several weeks culminating when she was seen in the emergency room. At that time the patient has had severe hyponatremia possibly due to dehydration. CAT scan has shown some coronary artery disease calcifications of an no evidence of pulmonary edema but does have some pulmonary nodules. Echocardiogram has shown normal LV systolic function with ejection fraction of 60% and moderate to severe pulmonary hypertension possibly due to lung issues. No evidence of significant valvular heart disease causing pulmonary hypertension. The patient has had any issue with urinary tract infection of which she is had on appropriate medication management and antibiotic use. Currently there is no evidence of significant heart failure symptoms at this time and the patient is continued on appropriate medication management for hypertension and diastolic dysfunction with metoprolol  Past Medical History  Diagnosis Date  . Asthma   . Hypertension   . Arthritis   . Seizures [facility])       Surgical History:  Past Surgical History  Procedure Laterality Date  . Joint replacement Left   . Cardiac catheterization     . Back surgery    . Kyphoplasty N/A 04/29/2015    Procedure: KYPHOPLASTY  T-12, T-10, T-7;  Surgeon: Kennedy Bucker, MD;  Location: [facility] ORS;  Service: Orthopedics;  Laterality: N/A;     Home Meds: Prior to Admission medications   Medication Sig Start Date End Date Taking? Authorizing Provider  acetaminophen (TYLENOL) 325 MG tablet Take 325 mg by mouth every 8 (eight) hours.    Yes Historical Provider, MD  acetaminophen (TYLENOL) 325 MG tablet Take 650 mg by mouth every 4 (four) hours as needed for mild pain, moderate pain, fever or headache.   Yes Historical Provider, MD  benzonatate (TESSALON) 100 MG capsule Take 100 mg by mouth 2 (two) times daily.   Yes Historical Provider, MD  Biotin 1000 MCG tablet Take 1,000 mcg by mouth daily.    Yes Historical Provider, MD  Cholecalciferol (VITAMIN D) 2000 UNITS tablet Take 2,000 Units by mouth daily.   Yes Historical Provider, MD  ciprofloxacin (CIPRO) 250 MG tablet Take 1 tablet (250 mg total) by mouth 2 (two) times daily. 08/21/15 08/27/15 Yes Irean Hong, MD  hydrALAZINE (APRESOLINE) 50 MG tablet Take 50 mg by mouth 2 (two) times daily.   Yes Historical Provider, MD  HYDROcodone-acetaminophen (NORCO/VICODIN) 5-325 MG per tablet Take 0.5 tablets by mouth 3 (three) times daily.    Yes Historical Provider, MD  HYDROcodone-acetaminophen (NORCO/VICODIN) 5-325 MG tablet Take 1 tablet by mouth every 6 (six) hours as needed for moderate pain or severe pain.   Yes Historical Provider, MD  hydrocortisone (CORTEF) 10 MG tablet Take 10  mg by mouth 2 (two) times daily.   Yes Historical Provider, MD  Iron-Vitamin C (VITRON-C) 65-125 MG TABS Take 1 tablet by mouth 3 (three) times a week. Take Monday - Wednesday - Friday   Yes Historical Provider, MD  levothyroxine (SYNTHROID, LEVOTHROID) 125 MCG tablet Take 125 mcg by mouth daily.    Yes Historical Provider, MD  lidocaine (LIDODERM) 5 % Place 1 patch onto the skin daily. Remove & Discard patch within 12 hours or as  directed by MD   Yes Historical Provider, MD  loperamide (IMODIUM) 2 MG capsule Take 2 mg by mouth 2 (two) times daily as needed for diarrhea or loose stools.    Yes Historical Provider, MD  magnesium hydroxide (MILK OF MAGNESIA) 400 MG/5ML suspension Take 45 mLs by mouth daily as needed for mild constipation or moderate constipation.    Yes Historical Provider, MD  magnesium oxide (MAG-OX) 400 MG tablet Take 400 mg by mouth daily.   Yes Historical Provider, MD  metoprolol (LOPRESSOR) 100 MG tablet Take 100 mg by mouth 2 (two) times daily.   Yes Historical Provider, MD  montelukast (SINGULAIR) 10 MG tablet Take 10 mg by mouth at bedtime.   Yes Historical Provider, MD  olmesartan (BENICAR) 40 MG tablet Take 40 mg by mouth daily.   Yes Historical Provider, MD  ondansetron (ZOFRAN) 4 MG tablet Take 1 tablet (4 mg total) by mouth every 8 (eight) hours as needed for nausea or vomiting. 08/21/15  Yes Irean HongJade J Sung, MD  ondansetron (ZOFRAN-ODT) 4 MG disintegrating tablet Take 4 mg by mouth 3 (three) times daily.    Yes Historical Provider, MD  pantoprazole (PROTONIX) 40 MG tablet Take 40 mg by mouth 2 (two) times daily. For 10 days then back down to 1 tablet daily 08/17/15  Yes Historical Provider, MD  polyethylene glycol (MIRALAX / GLYCOLAX) packet Take 17 g by mouth daily. Drink with water   Yes Historical Provider, MD  pramipexole (MIRAPEX) 0.25 MG tablet Take 0.25 mg by mouth 3 (three) times daily.   Yes Historical Provider, MD  sodium chloride 1 G tablet Take 1 g by mouth 2 (two) times daily.   Yes Historical Provider, MD  sucralfate (CARAFATE) 1 G tablet Take 1 g by mouth 3 (three) times daily before meals.   Yes Historical Provider, MD    Inpatient Medications:  . aspirin EC  81 mg Oral Daily  . benzonatate  100 mg Oral BID  . cholecalciferol  2,000 Units Oral Daily  . ciprofloxacin  250 mg Oral BID  . clindamycin (CLEOCIN) IV  600 mg Intravenous 3 times per day  . docusate sodium  100 mg Oral  BID  . enoxaparin (LOVENOX) injection  40 mg Subcutaneous Q24H  . ferrous sulfate  325 mg Oral Once per day on Mon Wed Fri   And  . vitamin C  250 mg Oral Once per day on Mon Wed Fri  . furosemide  40 mg Intravenous Once  . hydrALAZINE  50 mg Oral BID  . HYDROcodone-acetaminophen  0.5 tablet Oral TID  . hydrocortisone  10 mg Oral BID  . irbesartan  37.5 mg Oral Daily  . [START ON 08/26/2015] levothyroxine  150 mcg Oral QAC breakfast  . lidocaine  1 patch Transdermal Daily  . magnesium oxide  400 mg Oral Daily  . metoprolol  100 mg Oral BID  . montelukast  10 mg Oral QHS  . ondansetron  4 mg Oral TID  .  pantoprazole  40 mg Oral BID  . polyethylene glycol  17 g Oral Daily  . pramipexole  0.25 mg Oral TID  . sodium chloride  1 g Oral BID  . sucralfate  1 g Oral TID AC   . sodium chloride 100 mL/hr at 08/25/15 0535    Allergies:  Allergies  Allergen Reactions  . Strawberry Extract Anaphylaxis    Pt states her throat starts to close.   . Adhesive [Tape] Other (See Comments)    unknown  . Ciprofloxacin Other (See Comments)    unknown  . Codeine Other (See Comments)    unknown  . Furosemide Other (See Comments)    unknown  . Macrodantin [Nitrofurantoin Macrocrystal] Nausea Only  . Penicillins Hives  . Sulfa Antibiotics Other (See Comments)    unknown  . Valium [Diazepam] Other (See Comments)    Altered mental status  . Ziac [Bisoprolol-Hydrochlorothiazide] Other (See Comments)    unknown    Social History   Social History  . Marital Status: Widowed    Spouse Name: N/A  . Number of Children: N/A  . Years of Education: N/A   Occupational History  . Not on file.   Social History Main Topics  . Smoking status: Never Smoker   . Smokeless tobacco: Never Used  . Alcohol Use: No  . Drug Use: No  . Sexual Activity: Not Currently   Other Topics Concern  . Not on file   Social History Narrative     Family History  Problem Relation Age of Onset  . Breast cancer  Mother   . Brain cancer Father      Review of Systems Positive for shortness of breath urinary tract infection  Negative for: General:  chills, fever, night sweats or weight changes.  Cardiovascular: PND orthopnea syncope dizziness  Dermatological skin lesions rashes Respiratory: Cough congestion Urologic:Positive for frequent  urination urination at night and  no hematuria Abdominal: negative for nausea, vomiting, diarrhea, bright red blood per rectum, melena, or hematemesis Neurologic: negative for visual changes, and/or hearing changes  All other systems reviewed and are otherwise negative except as noted above.  Labs:  Recent Labs  08/11/2015 1136 07/29/2015 1726 08/24/15 0006 08/24/15 0513  TROPONINI <0.03 <0.03 <0.03 <0.03   Lab Results  Component Value Date   WBC 6.1 08/20/2015   HGB 12.2 07/24/2015   HCT 37.3 08/13/2015   MCV 93.3 08/18/2015   PLT 300 07/25/2015    Recent Labs Lab 07/30/2015 1136  08/25/15 1116  NA 120*  < > 120*  K 4.3  --  4.1  CL 87*  --  86*  CO2 26  --  28  BUN 11  --  13  CREATININE 0.51  < > 0.63  CALCIUM 9.2  --  8.5*  PROT 6.4*  --   --   BILITOT 0.4  --   --   ALKPHOS 59  --   --   ALT 49  --   --   AST 27  --   --   GLUCOSE 126*  --  123*  < > = values in this interval not displayed. No results found for: CHOL, HDL, LDLCALC, TRIG No results found for: DDIMER  Radiology/Studies:  Dg Chest 2 View  08/17/2015  CLINICAL DATA:  Dyspnea.  Nonproductive cough.  Chest tightness . EXAM: CHEST  2 VIEW COMPARISON:  08/20/2015 chest radiograph FINDINGS: Stable moderate eventration of the right hemidiaphragm. Stable cardiomediastinal silhouette with mild cardiomegaly.  No pneumothorax. No pleural effusion. Stable mild curvilinear opacities at both lung bases, in keeping with mild bibasilar atelectasis. No pulmonary edema. Multiple thoracolumbar spine compression deformities are noted status post vertebroplasty. IMPRESSION: Stable chest  radiograph with mild bibasilar atelectasis and chronic right hemidiaphragmatic eventration. Electronically Signed   By: Delbert Phenix M.D.   On: 09-02-15 11:38   Dg Chest 2 View  08/20/2015  CLINICAL DATA:  Shortness of breath for 2 weeks, weakness today. History of asthma, hypertension, EXAM: CHEST  2 VIEW COMPARISON:  Acute abdominal series August 19, 2015 FINDINGS: Cardiomediastinal silhouette is unremarkable for this low inspiratory examination with crowded vascular markings. Elevated RIGHT hemidiaphragm. Similar bibasilar strandy densities without pleural effusion or focal consolidation. No pneumothorax. Multiple old thoracolumbar compression fractures, status post multilevel vertebral body cement augmentation. Partially imaged lumbar instrumentation, approximate L1 pedicle screws project above the superior endplate though, not tailored for evaluation. IMPRESSION: Low inspiratory examination with bibasilar atelectasis. Electronically Signed   By: Awilda Metro M.D.   On: 08/20/2015 22:44   Ct Soft Tissue Neck Wo Contrast  2015/09/02  CLINICAL DATA:  Facial swelling around mandible and extending into anterior neck for the last 3 days. EXAM: CT NECK WITHOUT CONTRAST TECHNIQUE: Multidetector CT imaging of the neck was performed following the standard protocol without intravenous contrast. COMPARISON:  None. FINDINGS: Both submandibular salivary glands appear relatively enlarged and edematous with surrounding inflammatory changes. Inflammation extends out towards the skin and is slightly more prominent on the right compared to the left. Findings are suggestive of cellulitis involving the submandibular glands. No focal abscess identified. No obstructing salivary calculi or foreign bodies are identified by CT. The parotid glands appear normal. No focal masses or enlarged lymph nodes are seen. The visualized airway is normally patent. Paranasal sinuses and mastoid air cells are normally aerated. Diffuse  degenerative disease noted of the cervical spine. There is a small nodule in the periphery of the left upper lobe abutting the lateral aspect of the major fissure and measuring approximately 4 x 6 mm. IMPRESSION: 1. Submandibular region cellulitis with inflammatory changes also seen involving both submandibular salivary glands. No focal abscess. 2. Incidental 4 x 6 mm left upper lobe pulmonary nodule. If the patient is at high risk for bronchogenic carcinoma, follow-up chest CT at 6-12 months is recommended. If the patient is at low risk for bronchogenic carcinoma, follow-up chest CT at 12 months is recommended. This recommendation follows the consensus statement: Guidelines for Management of Small Pulmonary Nodules Detected on CT Scans: A Statement from the Fleischner Society as published in Radiology 2005;237:395-400. Electronically Signed   By: Irish Lack M.D.   On: 09-02-15 16:51   Ct Chest Wo Contrast  08/24/2015  CLINICAL DATA:  81 year old female -followup pulmonary nodules. EXAM: CT CHEST WITHOUT CONTRAST TECHNIQUE: Multidetector CT imaging of the chest was performed following the standard protocol without IV contrast. COMPARISON:  03/24/2015 chest CT FINDINGS: Mediastinum/Nodes: Cardiomegaly and coronary/thoracic aortic atherosclerotic calcifications noted. No enlarged lymph nodes or pericardial effusion identified. Lungs/Pleura: Multiple bilateral pulmonary nodules are unchanged. Index nodules are as follows: 11 mm posteriomedial right lower lobe nodule (image 39) 5 mm right lower lobe nodule (image 30) 5 mm left upper lobe nodule (image 15) No new or enlarging pulmonary nodules are identified. There is no evidence of airspace disease, mass, consolidation or endobronchial lesion. Minimal dependent and basilar atelectasis/scarring again noted. There is no evidence of pleural effusion Upper abdomen: Patient is status post cholecystectomy. A moderate hiatal hernia  is again noted. Musculoskeletal: No  acute abnormalities. Thoracic compression fractures and vertebral augmentation changes are again identified. IMPRESSION: Stable bilateral pulmonary nodules. Consider six-month CT followup to ensure 1 year stability. No evidence of acute abnormality within the chest. Moderate hiatal hernia, cardiomegaly, coronary disease and thoracic compression fractures/vertebral augmentation changes again noted. Electronically Signed   By: Harmon Pier M.D.   On: 08/24/2015 12:46   Ct Abdomen Pelvis W Contrast  08/21/2015  CLINICAL DATA:  Acute onset of generalized weakness and diffuse abdominal pain. Dyspnea. Initial encounter. EXAM: CT ABDOMEN AND PELVIS WITH CONTRAST TECHNIQUE: Multidetector CT imaging of the abdomen and pelvis was performed using the standard protocol following bolus administration of intravenous contrast. CONTRAST:  OMNIPAQUE IOHEXOL 300 MG/ML  SOLN COMPARISON:  CT of the abdomen and pelvis performed 11/24/2014 FINDINGS: Mild bibasilar atelectasis is noted. A small to moderate hiatal hernia is noted. The liver and spleen are unremarkable in appearance. The patient is status post cholecystectomy, with clips noted along the gallbladder fossa. The pancreas and adrenal glands are unremarkable. The kidneys are unremarkable in appearance. There is no evidence of hydronephrosis. No renal or ureteral stones are seen. Minimal nonspecific perinephric stranding is noted bilaterally. No free fluid is identified. The small bowel is unremarkable in appearance. The stomach is within normal limits. No acute vascular abnormalities are seen. Mild calcification is noted along the abdominal aorta. Calcification is noted at the proximal renal arteries bilaterally. The appendix is normal in caliber and contains air, without evidence of appendicitis. Scattered diverticulosis is noted along the sigmoid colon, without evidence of diverticulitis. The bladder is mildly distended and grossly unremarkable. The uterus is  unremarkable in appearance. The ovaries are grossly symmetric. No suspicious adnexal masses are seen. No inguinal lymphadenopathy is seen. No acute osseous abnormalities are identified. The patient is status post vertebroplasty at multiple levels along the lower thoracic and upper lumbar spine, and lumbar spinal fusion at L2-L5. The pedicle screws at L2 and L3 extend into the intervertebral discs. IMPRESSION: 1. No acute abnormality seen to explain the patient's symptoms. 2. Scattered diverticulosis along the sigmoid colon, without evidence of diverticulitis. 3. Small to moderate hiatal hernia noted. 4. Mild bibasilar atelectasis noted. 5. Mild calcification along the abdominal aorta, and at the proximal renal arteries bilaterally. 6. Numerous compression deformities along the thoracic and lumbar spine, status post lumbar spinal fusion at L2-L 5, and vertebroplasty at multiple levels. Pedicle screws at L2 and L3 extend into the intervertebral discs, grossly unchanged in appearance. Electronically Signed   By: Roanna Raider M.D.   On: 08/21/2015 00:28   Dg Abd Acute W/chest  08/19/2015  CLINICAL DATA:  Abnormal serum sodium levels. EXAM: DG ABDOMEN ACUTE W/ 1V CHEST COMPARISON:  07/26/2015 FINDINGS: The heart size is mildly enlarged. Normal pulmonary vascularity. Lung volumes are low. No focal airspace disease or consolidation in the lungs. No blunting of costophrenic angles. No pneumothorax. Mediastinal contours appear intact. There is a stable elevation of the right hemidiaphragm. Scattered gas and stool in the colon. No small or large bowel distention. No free intra-abdominal air. No abnormal air-fluid levels. No radiopaque stones. Visualized bones appear intact. There are changes from prior kyphoplasty within the lower thoracic spine and posterior anterior fusion within the lumbosacral spine. IMPRESSION: No evidence of small-bowel obstruction. Low lung volumes with chronic elevation of the right  hemidiaphragm. Stable enlargement of the cardiac silhouette. Electronically Signed   By: Ted Mcalpine M.D.   On: 08/19/2015 13:10  EKG: Normal sinus rhythm. Normal EKG  Weights: Filed Weights   09-13-15 1103 08/24/15 0433 08/25/15 0445  Weight: 166 lb (75.297 kg) 158 lb 12.8 oz (72.031 kg) 158 lb 6.4 oz (71.85 kg)     Physical Exam: Blood pressure 120/91, pulse 71, temperature 97.5 F (36.4 C), temperature source Oral, resp. rate 24, height  (1.575 m), weight 158 lb 6.4 oz (71.85 kg), SpO2 91 %. Body mass index is 28.96 kg/(m^2). General: Well developed, well nourished, in no acute distress. Head eyes ears nose throat: Normocephalic, atraumatic, sclera non-icteric, no xanthomas, nares are without discharge. No apparent thyromegaly and/or mass  Lungs: Normal respiratory effort.  no wheezes, no rales,few  rhonchi.  Heart: RRR with normal S1 S2. no murmur gallop, no rub, PMI is normal size and placement, carotid upstroke normal without bruit, jugular venous pressure is normal Abdomen: Soft, non-tender, non-distended with normoactive bowel sounds. No hepatomegaly. No rebound/guarding. No obvious abdominal masses. Abdominal aorta is normal size without bruit Extremities:Trace  edema. no cyanosis, no clubbing, no ulcers  Peripheral : 2+ bilateral upper extremity pulses, 2+ bilateral femoral pulses, 2+ bilateral dorsal pedal pulse Neuro:  not Alert and oriented. No facial asymmetry. No focal deficit. Moves all extremities spontaneously. Musculoskeletal: Normal muscle tone without kyphosis     Assessment: 81 year old female with essential hypertension area artery disease by CAT scan with pulmonary hypertension of unknown etiology likely contributing to shortness of breath and diastolic dysfunction but no overt congestive heart failure at this time on appropriate medication management including metoprolol diarrhetic and hydralazine  Plan: 1. Continue medication management for  hypertension control and diastolic dysfunction with metoprolol 2. Diuresis for any lower extremity edema pulmonary edema that arises with oral Lasix watching closely for hyponatremia and other concerns 3. No further cardiac diagnostics necessary at this time 4. Antibiotic use for urinary tract infection 5. Begin ambulation if able and further additional medication management changes as necessary  Signed, Lamar Blinks M.D. [facility] 08/25/2015, 1:21 PM

## 2015-08-25 NOTE — Care Management Note (Signed)
Case Management Note  Patient Details  Name: [name] MRN: [id number] Date of Birth: 03-21-1929  Subjective/Objective:                 Patient presents with hyponatremia.  [facility]. Patient uses [facility] to obtain her medications.  Patient is is wheelchair bound.  Patient is tran[facility] to [facility] appointments.  [facility] Will need resumption of care orders at time of discharge..   Action/Plan: RNCM to follow  Expected Discharge Date:                  Expected Discharge Plan:     In-House Referral:     Discharge planning Services     Post Acute Care Choice:    Choice offered to:     DME Arranged:    DME Agency:     HH Arranged:    HH Agency:     Status of Service:     Medicare Important Message Given:  Yes-second notification given Date Medicare IM Given:    Medicare IM give by:    Date Additional Medicare IM Given:    Additional Medicare Important Message give by:     If discussed at Long Length of Stay Meetings, dates discussed:    Additional Comments:  Chapman FitchBOWEN, Americus Scheurich T, RN 08/25/2015, 2:28 PM

## 2015-08-25 NOTE — [place] FL2 (Signed)
[place] MEDICAID FL2 LEVEL OF CARE SCREENING TOOL     IDENTIFICATION  Patient Name: [name] Birthdate: 06/28/1929 Sex: female Admission Date (Current Location): 07/28/2015  [place] and [place] Number:  [place] )   Facility and Address:  [facility], [address] [place], [place] [address]      Provider Number: [id number] [phone])  Attending Physician Name and Address:  Delfino Lovett, MD  Relative Name and Phone Number:       Current Level of Care: Hospital Recommended Level of Care: Skilled Nursing Facility Prior Approval Number:    Date Approved/Denied:   PASRR Number:  ( [id number] )  Discharge Plan: Domiciliary (Rest home)    Current Diagnoses: Patient Active Problem List   Diagnosis Date Noted  . Acute hyponatremia 07/30/2015    Orientation ACTIVITIES/SOCIAL BLADDER RESPIRATION    Self, Time, Situation, Place  Active Continent Normal  BEHAVIORAL SYMPTOMS/MOOD NEUROLOGICAL BOWEL NUTRITION STATUS   (none) Convulsions/Seizures (History of Seizures ) Continent Diet (Heart Healthy )  PHYSICIAN VISITS COMMUNICATION OF NEEDS Height & Weight Skin  30 days Verbally 5\' 2"  (157.5 cm) 158 lbs. Normal          AMBULATORY STATUS RESPIRATION    Supervision limited (Wheel Chair Bound ) Normal      Personal Care Assistance Level of Assistance  Bathing, Feeding, Dressing Bathing Assistance: Limited assistance Feeding assistance: Independent Dressing Assistance: Limited assistance      Functional Limitations Info  Sight, Hearing, Speech Sight Info: Adequate Hearing Info: Impaired Speech Info: Adequate       SPECIAL CARE FACTORS FREQUENCY  PT (By licensed PT), OT (By licensed OT)     PT Frequency:  (2) OT Frequency:  (2)           Additional Factors Info  Code Status, Allergies Code Status Info:  (Full Code. ) Allergies Info:  (Strawberry Extract, Adhesive Tape, Ciprofloxacin, Codeine, Furosemide,  Macrodantin Nitrofurantoin Macrocrystal, Penicillins, Sulfa Antibiotics, Valium Diazepam, Ziac Bisoprolol-hydrochlorothiazide)           Current Medications (08/25/2015): Current Facility-Administered Medications  Medication Dose Route Frequency Provider Last Rate Last Dose  . 0.9 %  sodium chloride infusion   Intravenous Continuous Arnaldo Natal, MD 100 mL/hr at 08/25/15 0535    . acetaminophen (TYLENOL) tablet 650 mg  650 mg Oral Q6H PRN Ramonita Lab, MD       Or  . acetaminophen (TYLENOL) suppository 650 mg  650 mg Rectal Q6H PRN Ramonita Lab, MD      . aspirin EC tablet 81 mg  81 mg Oral Daily Ramonita Lab, MD   81 mg at 08/25/15 0925  . benzonatate (TESSALON) capsule 100 mg  100 mg Oral BID Ramonita Lab, MD   100 mg at 08/25/15 0925  . cholecalciferol (VITAMIN D) tablet 2,000 Units  2,000 Units Oral Daily Ramonita Lab, MD   2,000 Units at 08/25/15 0924  . ciprofloxacin (CIPRO) tablet 250 mg  250 mg Oral BID Ramonita Lab, MD   250 mg at 08/25/15 0925  . clindamycin (CLEOCIN) IVPB 600 mg  600 mg Intravenous 3 times per day Ramonita Lab, MD   600 mg at 08/25/15 1440  . docusate sodium (COLACE) capsule 100 mg  100 mg Oral BID Ramonita Lab, MD   100 mg at 08/25/15 0924  . enoxaparin (LOVENOX) injection 40 mg  40 mg Subcutaneous Q24H Ramonita Lab, MD   40 mg at 08/24/15 2130  . ferrous sulfate tablet 325  mg  325 mg Oral Once per day on Mon Wed Fri Aruna Gouru, MD   325 mg at 08/25/15 0981   And  . vitamin C (ASCORBIC ACID) tablet 250 mg  250 mg Oral Once per day on Mon Wed Fri Aruna Gouru, MD   250 mg at 08/25/15 1914  . furosemide (LASIX) injection 40 mg  40 mg Intravenous Once Ramonita Lab, MD   40 mg at 09/22/15 1737  . hydrALAZINE (APRESOLINE) tablet 50 mg  50 mg Oral BID Ramonita Lab, MD   50 mg at 08/25/15 0924  . HYDROcodone-acetaminophen (NORCO/VICODIN) 5-325 MG per tablet 0.5 tablet  0.5 tablet Oral TID Ramonita Lab, MD   0.5 tablet at 08/25/15 0925  . HYDROcodone-acetaminophen  (NORCO/VICODIN) 5-325 MG per tablet 1 tablet  1 tablet Oral Q6H PRN Ramonita Lab, MD      . hydrocortisone (CORTEF) tablet 10 mg  10 mg Oral BID Ramonita Lab, MD   10 mg at 08/25/15 0925  . irbesartan (AVAPRO) tablet 37.5 mg  37.5 mg Oral Daily Ramonita Lab, MD   37.5 mg at 08/25/15 0925  . [START ON 08/26/2015] levothyroxine (SYNTHROID, LEVOTHROID) tablet 150 mcg  150 mcg Oral QAC breakfast Vipul Shah, MD      . lidocaine (LIDODERM) 5 % 1 patch  1 patch Transdermal Daily Ramonita Lab, MD   1 patch at 08/25/15 0925  . loperamide (IMODIUM) capsule 2 mg  2 mg Oral BID PRN Ramonita Lab, MD      . magnesium hydroxide (MILK OF MAGNESIA) suspension 45 mL  45 mL Oral Daily PRN Ramonita Lab, MD      . magnesium oxide (MAG-OX) tablet 400 mg  400 mg Oral Daily Ramonita Lab, MD   400 mg at 08/25/15 0925  . metoprolol (LOPRESSOR) tablet 100 mg  100 mg Oral BID Ramonita Lab, MD   100 mg at 08/25/15 0924  . montelukast (SINGULAIR) tablet 10 mg  10 mg Oral QHS Ramonita Lab, MD   10 mg at 08/24/15 2130  . ondansetron (ZOFRAN) tablet 4 mg  4 mg Oral Q6H PRN Ramonita Lab, MD   4 mg at 08/24/15 0803   Or  . ondansetron (ZOFRAN) injection 4 mg  4 mg Intravenous Q6H PRN Ramonita Lab, MD   4 mg at 08/25/15 0520  . ondansetron (ZOFRAN) tablet 4 mg  4 mg Oral Q8H PRN Ramonita Lab, MD      . ondansetron (ZOFRAN-ODT) disintegrating tablet 4 mg  4 mg Oral TID Ramonita Lab, MD   4 mg at 08/25/15 0923  . pantoprazole (PROTONIX) EC tablet 40 mg  40 mg Oral BID Ramonita Lab, MD   40 mg at 08/25/15 0924  . polyethylene glycol (MIRALAX / GLYCOLAX) packet 17 g  17 g Oral Daily Ramonita Lab, MD   17 g at 08/25/15 0923  . pramipexole (MIRAPEX) tablet 0.25 mg  0.25 mg Oral TID Ramonita Lab, MD   0.25 mg at 08/25/15 0924  . sodium chloride tablet 1 g  1 g Oral BID Ramonita Lab, MD   1 g at 08/25/15 0924  . sucralfate (CARAFATE) tablet 1 g  1 g Oral TID AC Ramonita Lab, MD   1 g at 08/25/15 1440   Do not use this list as official medication orders.  Please verify with discharge summary.  Discharge Medications:   Medication List    ASK your doctor about these medications        acetaminophen 325 MG tablet  Commonly known as:  TYLENOL  Take 325 mg by mouth every 8 (eight) hours.     acetaminophen 325 MG tablet  Commonly known as:  TYLENOL  Take 650 mg by mouth every 4 (four) hours as needed for mild pain, moderate pain, fever or headache.     benzonatate 100 MG capsule  Commonly known as:  TESSALON  Take 100 mg by mouth 2 (two) times daily.     Biotin 1000 MCG tablet  Take 1,000 mcg by mouth daily.     ciprofloxacin 250 MG tablet  Commonly known as:  CIPRO  Take 1 tablet (250 mg total) by mouth 2 (two) times daily.     hydrALAZINE 50 MG tablet  Commonly known as:  APRESOLINE  Take 50 mg by mouth 2 (two) times daily.     HYDROcodone-acetaminophen 5-325 MG tablet  Commonly known as:  NORCO/VICODIN  Take 0.5 tablets by mouth 3 (three) times daily.     HYDROcodone-acetaminophen 5-325 MG tablet  Commonly known as:  NORCO/VICODIN  Take 1 tablet by mouth every 6 (six) hours as needed for moderate pain or severe pain.     hydrocortisone 10 MG tablet  Commonly known as:  CORTEF  Take 10 mg by mouth 2 (two) times daily.     levothyroxine 125 MCG tablet  Commonly known as:  SYNTHROID, LEVOTHROID  Take 125 mcg by mouth daily.     lidocaine 5 %  Commonly known as:  LIDODERM  Place 1 patch onto the skin daily. Remove & Discard patch within 12 hours or as directed by MD     loperamide 2 MG capsule  Commonly known as:  IMODIUM  Take 2 mg by mouth 2 (two) times daily as needed for diarrhea or loose stools.     magnesium hydroxide 400 MG/5ML suspension  Commonly known as:  MILK OF MAGNESIA  Take 45 mLs by mouth daily as needed for mild constipation or moderate constipation.     magnesium oxide 400 MG tablet  Commonly known as:  MAG-OX  Take 400 mg by mouth daily.     metoprolol 100 MG tablet  Commonly known as:   LOPRESSOR  Take 100 mg by mouth 2 (two) times daily.     montelukast 10 MG tablet  Commonly known as:  SINGULAIR  Take 10 mg by mouth at bedtime.     olmesartan 40 MG tablet  Commonly known as:  BENICAR  Take 40 mg by mouth daily.     ondansetron 4 MG disintegrating tablet  Commonly known as:  ZOFRAN-ODT  Take 4 mg by mouth 3 (three) times daily.     ondansetron 4 MG tablet  Commonly known as:  ZOFRAN  Take 1 tablet (4 mg total) by mouth every 8 (eight) hours as needed for nausea or vomiting.     pantoprazole 40 MG tablet  Commonly known as:  PROTONIX  Take 40 mg by mouth 2 (two) times daily. For 10 days then back down to 1 tablet daily     polyethylene glycol packet  Commonly known as:  MIRALAX / GLYCOLAX  Take 17 g by mouth daily. Drink with water     pramipexole 0.25 MG tablet  Commonly known as:  MIRAPEX  Take 0.25 mg by mouth 3 (three) times daily.     sodium chloride 1 G tablet  Take 1 g by mouth 2 (two) times daily.     sucralfate 1 G tablet  Commonly known as:  CARAFATE  Take 1 g by mouth 3 (three) times daily before meals.     Vitamin D 2000 UNITS tablet  Take 2,000 Units by mouth daily.     VITRON-C 65-125 MG Tabs  Generic drug:  Iron-Vitamin C  Take 1 tablet by mouth 3 (three) times a week. Take Monday - Wednesday - Friday        Relevant Imaging Results:  Relevant Lab Results:  Recent Labs    Additional Information  (SSN: [id number])  Haig ProphetMorgan, Drevion Offord G, LCSW

## 2015-08-25 NOTE — Progress Notes (Signed)
Subjective:   Does not feel good today also Appetite is poor IV saline was re-started this AM Na remains low at 120   Objective:  Vital signs in last 24 hours:  Temp:  [97.5 F (36.4 C)-98.4 F (36.9 C)] 97.5 F (36.4 C) (11/02 1148) Pulse Rate:  [65-76] 71 (11/02 1148) Resp:  [20-24] 24 (11/02 1148) BP: (120-168)/(69-91) 120/91 mmHg (11/02 1148) SpO2:  [90 %-95 %] 91 % (11/02 1148) Weight:  [71.85 kg (158 lb 6.4 oz)] 71.85 kg (158 lb 6.4 oz) (11/02 0445)  Weight change: -3.447 kg (-7 lb 9.6 oz) Filed Weights   09-10-15 1103 08/24/15 0433 08/25/15 0445  Weight: 75.297 kg (166 lb) 72.031 kg (158 lb 12.8 oz) 71.85 kg (158 lb 6.4 oz)    Intake/Output:    Intake/Output Summary (Last 24 hours) at 08/25/15 1455 Last data filed at 08/25/15 1344  Gross per 24 hour  Intake      0 ml  Output    325 ml  Net   -325 ml     Physical Exam: General:  no acute distress, cushingoid appearance   HEENT  redness over the upper neck, decreased hearing   Neck  supple   Pulm/lungs  bilateral diffuse crackles   CVS/Heart  irregular rhythm, no rub, soft systolic murmur   Abdomen:   obese, soft, nontender, nondistended   Extremities:  no peripheral edema   Neurologic:  alert,   Skin:  redness as described above   Access:        Basic Metabolic Panel:   Recent Labs Lab 08/19/15 1213 08/20/15 2210 09/10/2015 1136 Sep 10, 2015 1726 08/24/15 0006 08/24/15 0513 08/24/15 1114 08/25/15 1116  NA 128* 130* 120* 120* 120* 121* 120* 120*  K 4.4 4.6 4.3  --   --   --   --  4.1  CL 93* 98* 87*  --   --   --   --  86*  CO2 --   --   --   --  28  GLUCOSE 115* 131* 126*  --   --   --   --  123*  BUN --   --   --   --  13  CREATININE 0.56 0.65 0.51 0.48  --   --   --  0.63  CALCIUM 9.6 8.9 9.2  --   --   --   --  8.5*     CBC:  Recent Labs Lab 08/19/15 1213 08/20/15 2210 09/10/15 1136 2015/09/10 1726  WBC 10.3 8.5 8.3 6.1  NEUTROABS 8.7* 6.9* 6.8*  --   HGB  13.1 11.8* 12.2 12.2  HCT 39.4 36.2 35.6 37.3  MCV 92.6 93.5 92.2 93.3  PLT 335 308 302 300      Microbiology:  Recent Results (from the past 720 hour(s))  Urine culture     Status: None   Collection Time: 08/20/15 10:11 PM  Result Value Ref Range Status   Specimen Description URINE, RANDOM  Final   Special Requests NONE  Final   Culture >=100,000 COLONIES/mL ESCHERICHIA COLI  Final   Report Status 09-10-15 FINAL  Final   Organism ID, Bacteria ESCHERICHIA COLI  Final      Susceptibility   Escherichia coli - MIC*    AMPICILLIN 4 SENSITIVE Sensitive     CEFTAZIDIME <=1 SENSITIVE Sensitive     CEFAZOLIN <=4 SENSITIVE Sensitive     CEFTRIAXONE <=1 SENSITIVE Sensitive  CIPROFLOXACIN <=0.25 SENSITIVE Sensitive     GENTAMICIN <=1 SENSITIVE Sensitive     IMIPENEM <=0.25 SENSITIVE Sensitive     TRIMETH/SULFA <=20 SENSITIVE Sensitive     PIP/TAZO Value in next row Sensitive      SENSITIVE<=4    * >=100,000 COLONIES/mL ESCHERICHIA COLI    Coagulation Studies: No results for input(s): LABPROT, INR in the last 72 hours.  Urinalysis:  Recent Labs  08/17/2015 1208  COLORURINE YELLOW*  LABSPEC 1.015  PHURINE 7.0  GLUCOSEU NEGATIVE  HGBUR NEGATIVE  BILIRUBINUR NEGATIVE  KETONESUR NEGATIVE  PROTEINUR 30*  NITRITE NEGATIVE  LEUKOCYTESUR NEGATIVE      Imaging: Ct Soft Tissue Neck Wo Contrast  08/02/2015  CLINICAL DATA:  Facial swelling around mandible and extending into anterior neck for the last 3 days. EXAM: CT NECK WITHOUT CONTRAST TECHNIQUE: Multidetector CT imaging of the neck was performed following the standard protocol without intravenous contrast. COMPARISON:  None. FINDINGS: Both submandibular salivary glands appear relatively enlarged and edematous with surrounding inflammatory changes. Inflammation extends out towards the skin and is slightly more prominent on the right compared to the left. Findings are suggestive of cellulitis involving the submandibular glands.  No focal abscess identified. No obstructing salivary calculi or foreign bodies are identified by CT. The parotid glands appear normal. No focal masses or enlarged lymph nodes are seen. The visualized airway is normally patent. Paranasal sinuses and mastoid air cells are normally aerated. Diffuse degenerative disease noted of the cervical spine. There is a small nodule in the periphery of the left upper lobe abutting the lateral aspect of the major fissure and measuring approximately 4 x 6 mm. IMPRESSION: 1. Submandibular region cellulitis with inflammatory changes also seen involving both submandibular salivary glands. No focal abscess. 2. Incidental 4 x 6 mm left upper lobe pulmonary nodule. If the patient is at high risk for bronchogenic carcinoma, follow-up chest CT at 6-12 months is recommended. If the patient is at low risk for bronchogenic carcinoma, follow-up chest CT at 12 months is recommended. This recommendation follows the consensus statement: Guidelines for Management of Small Pulmonary Nodules Detected on CT Scans: A Statement from the Fleischner Society as published in Radiology 2005;237:395-400. Electronically Signed   By: Irish LackGlenn  Yamagata M.D.   On: 08/03/2015 16:51   Ct Chest Wo Contrast  08/24/2015  CLINICAL DATA:  81 year old female -followup pulmonary nodules. EXAM: CT CHEST WITHOUT CONTRAST TECHNIQUE: Multidetector CT imaging of the chest was performed following the standard protocol without IV contrast. COMPARISON:  03/24/2015 chest CT FINDINGS: Mediastinum/Nodes: Cardiomegaly and coronary/thoracic aortic atherosclerotic calcifications noted. No enlarged lymph nodes or pericardial effusion identified. Lungs/Pleura: Multiple bilateral pulmonary nodules are unchanged. Index nodules are as follows: 11 mm posteriomedial right lower lobe nodule (image 39) 5 mm right lower lobe nodule (image 30) 5 mm left upper lobe nodule (image 15) No new or enlarging pulmonary nodules are identified. There is  no evidence of airspace disease, mass, consolidation or endobronchial lesion. Minimal dependent and basilar atelectasis/scarring again noted. There is no evidence of pleural effusion Upper abdomen: Patient is status post cholecystectomy. A moderate hiatal hernia is again noted. Musculoskeletal: No acute abnormalities. Thoracic compression fractures and vertebral augmentation changes are again identified. IMPRESSION: Stable bilateral pulmonary nodules. Consider six-month CT followup to ensure 1 year stability. No evidence of acute abnormality within the chest. Moderate hiatal hernia, cardiomegaly, coronary disease and thoracic compression fractures/vertebral augmentation changes again noted. Electronically Signed   By: Harmon PierJeffrey  Hu M.D.   On: 08/24/2015  12:46     Medications:   . sodium chloride 100 mL/hr at 08/25/15 0535   . aspirin EC  81 mg Oral Daily  . benzonatate  100 mg Oral BID  . cholecalciferol  2,000 Units Oral Daily  . ciprofloxacin  250 mg Oral BID  . clindamycin (CLEOCIN) IV  600 mg Intravenous 3 times per day  . docusate sodium  100 mg Oral BID  . enoxaparin (LOVENOX) injection  40 mg Subcutaneous Q24H  . ferrous sulfate  325 mg Oral Once per day on Mon Wed Fri   And  . vitamin C  250 mg Oral Once per day on Mon Wed Fri  . furosemide  40 mg Intravenous Once  . hydrALAZINE  50 mg Oral BID  . HYDROcodone-acetaminophen  0.5 tablet Oral TID  . hydrocortisone  10 mg Oral BID  . irbesartan  37.5 mg Oral Daily  . [START ON 08/26/2015] levothyroxine  150 mcg Oral QAC breakfast  . lidocaine  1 patch Transdermal Daily  . magnesium oxide  400 mg Oral Daily  . metoprolol  100 mg Oral BID  . montelukast  10 mg Oral QHS  . ondansetron  4 mg Oral TID  . pantoprazole  40 mg Oral BID  . polyethylene glycol  17 g Oral Daily  . pramipexole  0.25 mg Oral TID  . sodium chloride  1 g Oral BID  . sucralfate  1 g Oral TID AC   acetaminophen **OR** acetaminophen, HYDROcodone-acetaminophen,  loperamide, magnesium hydroxide, ondansetron **OR** ondansetron (ZOFRAN) IV, ondansetron  Assessment/ Plan:  81 y.o. female With history of hypo-thyroidism, hypertension, degenerative joint disease, depression, TIAs, GERD, irritable bowel syndrome, restless leg syndrome, coronary disease, hyponatremia and mild renal insufficiency  1. Hyponatremia Chronic problem but her level was 133 back in August urine osmolality > S Osm suggesting SIADH Patient does not seem to be hypovolemic, therefore,suggest to discontinue normal saline infusion  noncontrast CT shows no progression in pulm nodules Endocrine consultation to help assist in managing hypo-adrenalism Suggest salt tabs and lasix If no improvement will order tolvaptan tomorrow    LOS: 2 Jerrika Ledlow 11/2/20162:55 PM

## 2015-08-25 NOTE — Care Management Important Message (Signed)
Important Message  Patient Details  Name: [name] MRN: [id number] Date of Birth: August 07, 1929   Medicare Important Message Given:  Yes-second notification given    Olegario MessierKathy A Blease Capaldi 08/25/2015, 10:08 AM

## 2015-08-26 ENCOUNTER — Inpatient Hospital Stay: Payer: Medicare Other

## 2015-08-26 DIAGNOSIS — E871 Hypo-osmolality and hyponatremia: Secondary | ICD-10-CM

## 2015-08-26 DIAGNOSIS — J9601 Acute respiratory failure with hypoxia: Secondary | ICD-10-CM

## 2015-08-26 DIAGNOSIS — J9602 Acute respiratory failure with hypercapnia: Secondary | ICD-10-CM

## 2015-08-26 DIAGNOSIS — E662 Morbid (severe) obesity with alveolar hypoventilation: Secondary | ICD-10-CM

## 2015-08-26 DIAGNOSIS — E249 Cushing's syndrome, unspecified: Secondary | ICD-10-CM

## 2015-08-26 LAB — CBC
HCT: 33.1 % — ABNORMAL LOW (ref 35.0–47.0)
HCT: 33.8 % — ABNORMAL LOW (ref 35.0–47.0)
HEMOGLOBIN: 11.1 g/dL — AB (ref 12.0–16.0)
Hemoglobin: 11.4 g/dL — ABNORMAL LOW (ref 12.0–16.0)
MCH: 31.4 pg (ref 26.0–34.0)
MCH: 31.1 pg (ref 26.0–34.0)
MCHC: 33.6 g/dL (ref 32.0–36.0)
MCHC: 33.6 g/dL (ref 32.0–36.0)
MCV: 93.4 fL (ref 80.0–100.0)
MCV: 92.5 fL (ref 80.0–100.0)
Platelets: 296 10*3/uL (ref 150–440)
Platelets: 301 10*3/uL (ref 150–440)
RBC: 3.54 MIL/uL — ABNORMAL LOW (ref 3.80–5.20)
RBC: 3.66 MIL/uL — ABNORMAL LOW (ref 3.80–5.20)
RDW: 15 % — ABNORMAL HIGH (ref 11.5–14.5)
RDW: 15.1 % — AB (ref 11.5–14.5)
WBC: 5.7 10*3/uL (ref 3.6–11.0)
WBC: 5.5 10*3/uL (ref 3.6–11.0)

## 2015-08-26 LAB — BASIC METABOLIC PANEL
ANION GAP: 6 (ref 5–15)
BUN: 13 mg/dL (ref 6–20)
CALCIUM: 8.4 mg/dL — AB (ref 8.9–10.3)
CO2: 25 mmol/L (ref 22–32)
Chloride: 92 mmol/L — ABNORMAL LOW (ref 101–111)
Creatinine, Ser: 0.37 mg/dL — ABNORMAL LOW (ref 0.44–1.00)
GLUCOSE: 116 mg/dL — AB (ref 65–99)
Potassium: 3.9 mmol/L (ref 3.5–5.1)
Sodium: 123 mmol/L — ABNORMAL LOW (ref 135–145)

## 2015-08-26 LAB — PROTEIN ELECTROPHORESIS, SERUM
A/G Ratio: 1.2 (ref 0.7–1.7)
ALPHA-1-GLOBULIN: 0.2 g/dL (ref 0.0–0.4)
ALPHA-2-GLOBULIN: 0.8 g/dL (ref 0.4–1.0)
Albumin ELP: 3 g/dL (ref 2.9–4.4)
Beta Globulin: 0.9 g/dL (ref 0.7–1.3)
GAMMA GLOBULIN: 0.7 g/dL (ref 0.4–1.8)
Globulin, Total: 2.6 g/dL (ref 2.2–3.9)
Total Protein ELP: 5.6 g/dL — ABNORMAL LOW (ref 6.0–8.5)

## 2015-08-26 LAB — BLOOD GAS, ARTERIAL
ALLENS TEST (PASS/FAIL): POSITIVE — AB
Acid-Base Excess: 0.3 mmol/L (ref 0.0–3.0)
BICARBONATE: 26.9 meq/L (ref 21.0–28.0)
FIO2: 0.4
O2 Saturation: 96.1 %
PH ART: 7.33 — AB (ref 7.350–7.450)
PO2 ART: 88 mmHg (ref 83.0–108.0)
Patient temperature: 37
pCO2 arterial: 51 mmHg — ABNORMAL HIGH (ref 32.0–48.0)

## 2015-08-26 LAB — COMPREHENSIVE METABOLIC PANEL
ALT: 71 U/L — AB (ref 14–54)
AST: 47 U/L — AB (ref 15–41)
Albumin: 3.3 g/dL — ABNORMAL LOW (ref 3.5–5.0)
Alkaline Phosphatase: 65 U/L (ref 38–126)
Anion gap: 6 (ref 5–15)
BILIRUBIN TOTAL: 0.5 mg/dL (ref 0.3–1.2)
BUN: 12 mg/dL (ref 6–20)
CALCIUM: 8.6 mg/dL — AB (ref 8.9–10.3)
CO2: 26 mmol/L (ref 22–32)
CREATININE: 0.51 mg/dL (ref 0.44–1.00)
Chloride: 91 mmol/L — ABNORMAL LOW (ref 101–111)
GFR calc Af Amer: >60 mL/min (ref >60)
Glucose, Bld: 109 mg/dL — ABNORMAL HIGH (ref 65–99)
Potassium: 3.8 mmol/L (ref 3.5–5.1)
Sodium: 123 mmol/L — ABNORMAL LOW (ref 135–145)
TOTAL PROTEIN: 5.9 g/dL — AB (ref 6.5–8.1)

## 2015-08-26 LAB — AMMONIA: Ammonia: 33 umol/L (ref 9–35)

## 2015-08-26 LAB — MRSA PCR SCREENING: MRSA by PCR: NEGATIVE

## 2015-08-26 LAB — GLUCOSE, CAPILLARY: GLUCOSE-CAPILLARY: 113 mg/dL — AB (ref 65–99)

## 2015-08-26 MED ORDER — ENSURE ENLIVE PO LIQD
237.0000 mL | Freq: Two times a day (BID) | ORAL | Status: DC
  Administered 2015-08-26 (×2): 237 mL via ORAL

## 2015-08-26 MED ORDER — TOLVAPTAN 15 MG PO TABS
15.0000 mg | ORAL_TABLET | ORAL | Status: AC
  Administered 2015-08-26 – 2015-08-28 (×2): 15 mg via ORAL
  Filled 2015-08-26 (×3): qty 1

## 2015-08-26 MED ORDER — FREE WATER: 200.0000 mL | Status: DC

## 2015-08-26 NOTE — Progress Notes (Signed)
Patient alert and oriented.  Up to the chair for meals.  Voiding minimal amount.  VSS.  No complaints of pain.  Nausea is intermittent.

## 2015-08-26 NOTE — Progress Notes (Signed)
Patient ID: [name], female   DOB: 1929-10-02, 81 y.o.   MRN: [id number] [facility] [place] [facility]   PATIENT NAME: [name]    MR#:  [id number]  DATE OF BIRTH:  1929-10-02  SUBJECTIVE:  Not feeling good, Na 123, says may be retaining urine and requests foley REVIEW OF SYSTEMS:   Review of Systems  Constitutional: Positive for malaise/fatigue. Negative for fever, chills and weight loss.  HENT: Negative for ear discharge, ear pain and nosebleeds.   Eyes: Negative for blurred vision, pain and discharge.  Respiratory: Negative for sputum production, shortness of breath, wheezing and stridor.   Cardiovascular: Negative for chest pain, palpitations, orthopnea and PND.  Gastrointestinal: Negative for nausea, vomiting, abdominal pain and diarrhea.  Genitourinary: Negative for urgency and frequency.  Musculoskeletal: Negative for back pain and joint pain.  Neurological: Positive for weakness. Negative for sensory change, speech change and focal weakness.  Psychiatric/Behavioral: Negative for depression and hallucinations. The patient is not nervous/anxious.    Tolerating Diet:yes Tolerating PT: pending  DRUG ALLERGIES:   Allergies  Allergen Reactions  . Strawberry Extract Anaphylaxis    Pt states her throat starts to close.   . Adhesive [Tape] Other (See Comments)    unknown  . Ciprofloxacin Other (See Comments)    unknown  . Codeine Other (See Comments)    unknown  . Furosemide Other (See Comments)    unknown  . Macrodantin [Nitrofurantoin Macrocrystal] Nausea Only  . Penicillins Hives  . Sulfa Antibiotics Other (See Comments)    unknown  . Valium [Diazepam] Other (See Comments)    Altered mental status  . Ziac [Bisoprolol-Hydrochlorothiazide] Other (See Comments)    unknown    VITALS:  Blood pressure 138/79, pulse 63, temperature 97.7 F (36.5 C), temperature source Oral, resp. rate 17, height 5\' 2"  (1.575 m), weight 72.213 kg  (159 lb 3.2 oz), SpO2 90 %.  PHYSICAL EXAMINATION:   Physical Exam  Constitutional: She is oriented to person, place, and time and well-developed, well-nourished, and in no distress. She appears lethargic. She appears unhealthy. She appears toxic. She has a sickly appearance.  HENT:  Head: Normocephalic and atraumatic.  Eyes: Conjunctivae and EOM are normal. Pupils are equal, round, and reactive to light.  Neck: Normal range of motion. Neck supple. No tracheal deviation present. No thyromegaly present.  Cardiovascular: Normal rate, regular rhythm and normal heart sounds.   Pulmonary/Chest: Effort normal and breath sounds normal. No respiratory distress. She has no wheezes. She exhibits no tenderness.  Abdominal: Soft. Bowel sounds are normal. She exhibits no distension. There is no tenderness.  Musculoskeletal: Normal range of motion.  Neurological: She is oriented to person, place, and time. She appears lethargic. No cranial nerve deficit.  Skin: Skin is warm and dry. No rash noted.  Psychiatric: Mood and affect normal.   LABORATORY PANEL:   CBC  Recent Labs Lab 08/26/15 0412  WBC 5.7  HGB 11.1*  HCT 33.1*  PLT 296    Chemistries   Recent Labs Lab 08/07/2015 1136  08/26/15 0412  NA 120*  < > 123*  K 4.3  < > 3.9  CL 87*  < > 92*  CO2 26  < > 25  GLUCOSE 126*  < > 116*  BUN 11  < > 13  CREATININE 0.51  < > 0.37*  CALCIUM 9.2  < > 8.4*  AST 27  --   --   ALT 49  --   --  ALKPHOS 59  --   --   BILITOT 0.4  --   --   < > = values in this interval not displayed.  Cardiac Enzymes  Recent Labs Lab 08/24/15 0513  TROPONINI <0.03    RADIOLOGY:  No results found. ASSESSMENT AND PLAN:   * Acute metabolic encephalopathy: unsure etio. CT head STAT. ABG STAT, transfer to ICU, insert foley. Neuro c/s. Check ammonia. D/w e-ICU via phone who will notify on call person for [facility].  Neurology consultation  1. Acute hyponatremia probably due to SIADH, d/w nephro.  tolvaptan started today by nephrology.  Sodium of 123.  Will check metabolic panel stat  2. Acute Sialadenitis IV clindamycin  -appreciate ENT eval  3. Recent diagnosis of Escherichia coli UTI sensitive to ciprofloxacin Continue by mouth ciprofloxacin for now. Can be stopped on 11/4.  4. History of hypothyroidism: TSH high, Appreciate Endocrine input.  Anterior Synthroid at current home dose  5. Essential hypertension-continue Lopressor her home medication and titrate as needed basis  6. Pulmonary nodule-outpatient follow-up with primary care physician for further evaluation Repeat CT shows stable nodules. From July 2016 ?pulmoary eval  7.  Urinary retention: Insert Foley  Case discussed with Care Management/Social Worker and e-ICU person  Management plans discussed with the patient, family and they are in agreement.  CODE STATUS: full  DVT Prophylaxis: lovenox  TOTAL TIME (Critical Care) TAKING CARE OF THIS PATIENT: 35 minutes.   >50% time spent on counselling and coordination of care (discussed with patient's son, [name] at [phone] who is coming to hospital. [name] in [place] at [phone])  Will transfer her to intensive care at this time for close monitoring.  She looks critically sick, I am worried about possible rapid correction of her sodium or new stroke.   [facility], Khailee Mick M.D on 08/26/2015 at 4:02 PM  Between 7am to 6pm - Pager - [phone]  After 6pm go to [email or web address] - password [email or web address]  [facility]  Office  [phone]  CC: Primary care physician; Danella Penton., MD

## 2015-08-26 NOTE — Progress Notes (Signed)
PT Cancellation Note  Patient Details Name: [name] MRN: [id number] DOB: 10-19-1929   Cancelled Treatment:    Reason Eval/Treat Not Completed: Medical issues which prohibited therapy (Upon arrival to room, patient sleeping but arousable to therapist voice.  Readily states she "isn't having a good day today".  Noted SOB with use of accessory muscles at times, audibly congested; appears to intermittent apneic episodes as patient drifts with sats fluctuating from low-80s to low-90s.  Applied 2L supplemental O2 per RN--sats remain >90% when awake, but continue to drift between 85-88% when sleeping.  RN and charge nurse informed/aware of patient status and respiratory concerns.)   Mae Denunzio H. Manson PasseyBrown, PT, DPT, NCS 08/26/2015, 3:50 PM [phone]

## 2015-08-26 NOTE — Progress Notes (Signed)
Endocrinology consult follow up:  SUBJECTIVE: Reason for Consultation: abnormal TSH, facial/neck swelling   History of Present Illness: [name] is a 81 y.o. female with PMH HTN, Postablative hypothyroidism, and chronic hyponatremia thought to be secondary to adrenal insufficiency admitted with generalized weakness and altered mental status. Her sodium level had declined from 130 range to 120 upon presentation despite free water restriction and taking 2 salt tablets a day. Endocrinology is consulted regarding mildly elevated TSH and facial/neck swelling. TSH inpatient was 5.378. TSH in the outpatient setting 3 weeks prior was wnl.  She was seen early this am. She reports fatigue. Denies shortness of breath. Receiving normal saline at 100 cc/hr. Na level 120-->123. Receiving hydrocortisone 10 mg bid. Levothyroxine was increased to 150 mcg daily. Tolvaptan ordered.  Inpatient medications:  Current facility-administered medications:  .  0.9 %  sodium chloride infusion, , Intravenous, Continuous, Arnaldo Natal, MD, Last Rate: 100 mL/hr at 08/26/15 0335 .  acetaminophen (TYLENOL) tablet 650 mg, 650 mg, Oral, Q6H PRN **OR** acetaminophen (TYLENOL) suppository 650 mg, 650 mg, Rectal, Q6H PRN, Ramonita Lab, MD .  aspirin EC tablet 81 mg, 81 mg, Oral, Daily, Ramonita Lab, MD, 81 mg at 08/26/15 0902 .  benzonatate (TESSALON) capsule 100 mg, 100 mg, Oral, BID, Ramonita Lab, MD, 100 mg at 08/26/15 0902 .  cholecalciferol (VITAMIN D) tablet 2,000 Units, 2,000 Units, Oral, Daily, Ramonita Lab, MD, 2,000 Units at 08/26/15 0901 .  ciprofloxacin (CIPRO) tablet 250 mg, 250 mg, Oral, BID, Ramonita Lab, MD, 250 mg at 08/26/15 0902 .  clindamycin (CLEOCIN) IVPB 600 mg, 600 mg, Intravenous, 3 times per day, Ramonita Lab, MD, 600 mg at 08/26/15 0505 .  docusate sodium (COLACE) capsule 100 mg, 100 mg, Oral, BID, Ramonita Lab, MD, 100 mg at 08/26/15 0901 .  enoxaparin (LOVENOX) injection 40 mg, 40 mg, Subcutaneous,  Q24H, Ramonita Lab, MD, 40 mg at 08/25/15 2155 .  feeding supplement (ENSURE ENLIVE) (ENSURE ENLIVE) liquid 237 mL, 237 mL, Oral, BID BM, Harmeet Singh, MD, 237 mL at 08/26/15 1000 .  ferrous sulfate tablet 325 mg, 325 mg, Oral, Once per day on Mon Wed Fri, 325 mg at 08/25/15 1610 **AND** vitamin C (ASCORBIC ACID) tablet 250 mg, 250 mg, Oral, Once per day on Mon Wed Fri, Aruna Gouru, MD, 250 mg at 08/25/15 9604 .  furosemide (LASIX) injection 40 mg, 40 mg, Intravenous, Once, Ramonita Lab, MD, 40 mg at 08/09/2015 1737 .  hydrALAZINE (APRESOLINE) tablet 50 mg, 50 mg, Oral, BID, Ramonita Lab, MD, 50 mg at 08/26/15 0902 .  HYDROcodone-acetaminophen (NORCO/VICODIN) 5-325 MG per tablet 1 tablet, 1 tablet, Oral, Q6H PRN, Ramonita Lab, MD .  hydrocortisone (CORTEF) tablet 10 mg, 10 mg, Oral, BID, Ramonita Lab, MD, 10 mg at 08/26/15 0902 .  irbesartan (AVAPRO) tablet 37.5 mg, 37.5 mg, Oral, Daily, Ramonita Lab, MD, 37.5 mg at 08/26/15 0902 .  levothyroxine (SYNTHROID, LEVOTHROID) tablet 150 mcg, 150 mcg, Oral, QAC breakfast, Delfino Lovett, MD, 150 mcg at 08/26/15 0902 .  lidocaine (LIDODERM) 5 % 1 patch, 1 patch, Transdermal, Daily, Ramonita Lab, MD, 1 patch at 08/26/15 0903 .  loperamide (IMODIUM) capsule 2 mg, 2 mg, Oral, BID PRN, Ramonita Lab, MD .  magnesium hydroxide (MILK OF MAGNESIA) suspension 45 mL, 45 mL, Oral, Daily PRN, Ramonita Lab, MD .  magnesium oxide (MAG-OX) tablet 400 mg, 400 mg, Oral, Daily, Ramonita Lab, MD, 400 mg at 08/26/15 0902 .  metoprolol (LOPRESSOR) tablet 100 mg, 100 mg, Oral, BID,  Ramonita Lab, MD, 100 mg at 08/26/15 0901 .  montelukast (SINGULAIR) tablet 10 mg, 10 mg, Oral, QHS, Ramonita Lab, MD, 10 mg at 08/25/15 2155 .  ondansetron (ZOFRAN) tablet 4 mg, 4 mg, Oral, Q6H PRN, 4 mg at 08/24/15 0803 **OR** ondansetron (ZOFRAN) injection 4 mg, 4 mg, Intravenous, Q6H PRN, Ramonita Lab, MD, 4 mg at 08/26/15 1208 .  ondansetron (ZOFRAN) tablet 4 mg, 4 mg, Oral, Q8H PRN, Deanna Artis Gouru, MD .  ondansetron  (ZOFRAN-ODT) disintegrating tablet 4 mg, 4 mg, Oral, TID, Ramonita Lab, MD, 4 mg at 08/26/15 0902 .  pantoprazole (PROTONIX) EC tablet 40 mg, 40 mg, Oral, BID, Ramonita Lab, MD, 40 mg at 08/26/15 0901 .  polyethylene glycol (MIRALAX / GLYCOLAX) packet 17 g, 17 g, Oral, Daily, Ramonita Lab, MD, 17 g at 08/25/15 0923 .  pramipexole (MIRAPEX) tablet 0.25 mg, 0.25 mg, Oral, TID, Ramonita Lab, MD, 0.25 mg at 08/26/15 0901 .  sodium chloride tablet 1 g, 1 g, Oral, BID, Ramonita Lab, MD, 1 g at 08/26/15 0902 .  sucralfate (CARAFATE) tablet 1 g, 1 g, Oral, TID AC, Ramonita Lab, MD, 1 g at 08/26/15 1156 .  tolvaptan (SAMSCA) tablet 15 mg, 15 mg, Oral, Q24H, Mosetta Pigeon, MD, 15 mg at 08/26/15 1156  O:  Filed Vitals:   08/25/15 2119 08/26/15 0412 08/26/15 0700 08/26/15 1157  BP: 151/75 159/78  138/79  Pulse: 76 77  63  Temp: 97.9 F (36.6 C) 98 F (36.7 C)  97.7 F (36.5 C)  TempSrc:  Oral  Oral  Resp: Height:      Weight:   72.213 kg (159 lb 3.2 oz)   SpO2: 91% 93%  90%  Physical Exam: Gen: sitting in chair; appearing cushingnoid Neuro: sleepy but arousable and oriented x 3, answering all questions appropriately   Face: rounded moon facies, no plethora HEENT: NCAT, eyes anicteric, PERRL, oral mucosa moist Neck: obese, no thyromegaly  CVS: RRR s1, s2 Pulm: rales in the mid to lower lung fields bilaterally Extr: thin extremities, muscular atrophy, no edema Abd: obese, no striae, soft, nontender Psych: normal affect, good insight into her medical condition  Labs:  BMP Latest Ref Rng 08/26/2015 08/25/2015 08/24/2015  Glucose 65 - 99 mg/dL 811(B) 147(W) -  BUN 6 - 20 mg/dL 13 13 -  Creatinine 2.95 - 1.00 mg/dL 6.21(H) 0.86 -  Sodium 135 - 145 mmol/L 123(L) 120(L) 120(L)  Potassium 3.5 - 5.1 mmol/L 3.9 4.1 -  Chloride 101 - 111 mmol/L 92(L) 86(L) -  CO2 22 - 32 mmol/L 25 28 -  Calcium 8.9 - 10.3 mg/dL 5.7(Q) 4.6(N) -    ASSESSMENT:  1. Hypothyroidism - stable 2. Hyponatremia -  secondary to SIADH  3. Adrenal insufficiency   RECOMMENDATIONS:  1.  Would switch back to patient's home dose of levothyroxine 125 mcg daily. TSH was 1.74 outpatient just 3 weeks ago and so biochemically she is euthyroid. Given her age, cardiac risk associated with over-replacement of thyroid hormone should be considered. TSH should be repeated outpatient in 6 weeks.  2. With regard to her hyponatremia, defer to Nephrology. Her steroid replacement is adequate and it is unlikely that her acute worsening of hyponatremia is due to adrenal insufficiency. Case was discussed with Dr. Tedd Sias who has seen her outpatient. Historically, she has not done better on higher doses of steroid. Would continue the current regimen of hydrocortisone 10 mg twice daily.   She should follow up with Dr.  Solum outpatient 2 weeks post-discharge. Will sign off. Thank you for allowing me to participate in this patient's care.  Doylene CanningAbby Abisogun, MD [facility]

## 2015-08-26 NOTE — Progress Notes (Signed)
[facility]  Patient: Laura LoraFrances D Rogan / Admit Date: 08/09/2015 / Date of Encounter: 08/26/2015, 8:51 AM   Subjective: Patient is feeling better with less shortness of breath Hemodynamically stable  Review of Systems: Positive for: Shortness of breath and weakness Negative for: Vision change, hearing change, syncope, dizziness, nausea, vomiting,diarrhea, bloody stool, stomach pain, cough, congestion, diaphoresis, urinary frequency, urinary pain,skin lesions, skin rashes Others previously listed  Objective: Telemetry: Normal sinus rhythm Physical Exam: Blood pressure 159/78, pulse 77, temperature 98 F (36.7 C), temperature source Oral, resp. rate 18, height 5\' 2"  (1.575 m), weight 159 lb 3.2 oz (72.213 kg), SpO2 93 %. Body mass index is 29.11 kg/(m^2). General: Well developed, well nourished, in no acute distress. Head: Normocephalic, atraumatic, sclera non-icteric, no xanthomas, nares are without discharge. Neck: No apparent masses Lungs: Normal respirations with few wheezes, no rhonchi, no rales , basilar crackles   Heart: Regular rate and rhythm, normal S1 S2, no murmur, no rub, no gallop, PMI is diffuse carotid upstroke normal without bruit, jugular venous pressure normal Abdomen: Soft, non-tender, non-distended with normoactive bowel sounds. No hepatosplenomegaly. Abdominal aorta is normal size without bruit Extremities: Trace to 1+ edema, no clubbing, no cyanosis, no ulcers,  Peripheral: 2+ radial, 2+ femoral, 2+ dorsal pedal pulses Neuro: Alert and oriented. Moves all extremities spontaneously. Psych:  Responds to questions appropriately with a normal affect.   Intake/Output Summary (Last 24 hours) at 08/26/15 0851 Last data filed at 08/26/15 0701  Gross per 24 hour  Intake      0 ml  Output    300 ml  Net   -300 ml    Inpatient Medications:  . aspirin EC  81 mg Oral Daily  . benzonatate  100 mg Oral BID  . cholecalciferol  2,000  Units Oral Daily  . ciprofloxacin  250 mg Oral BID  . clindamycin (CLEOCIN) IV  600 mg Intravenous 3 times per day  . docusate sodium  100 mg Oral BID  . enoxaparin (LOVENOX) injection  40 mg Subcutaneous Q24H  . ferrous sulfate  325 mg Oral Once per day on Mon Wed Fri   And  . vitamin C  250 mg Oral Once per day on Mon Wed Fri  . furosemide  40 mg Intravenous Once  . hydrALAZINE  50 mg Oral BID  . hydrocortisone  10 mg Oral BID  . irbesartan  37.5 mg Oral Daily  . levothyroxine  150 mcg Oral QAC breakfast  . lidocaine  1 patch Transdermal Daily  . magnesium oxide  400 mg Oral Daily  . metoprolol  100 mg Oral BID  . montelukast  10 mg Oral QHS  . ondansetron  4 mg Oral TID  . pantoprazole  40 mg Oral BID  . polyethylene glycol  17 g Oral Daily  . pramipexole  0.25 mg Oral TID  . sodium chloride  1 g Oral BID  . sucralfate  1 g Oral TID AC   Infusions:  . sodium chloride 100 mL/hr at 08/26/15 0335    Labs:  Recent Labs  08/25/15 1116 08/26/15 0412  NA 120* 123*  K 4.1 3.9  CL 86* 92*  CO2 28 25  GLUCOSE 123* 116*  BUN 13 13  CREATININE 0.63 0.37*  CALCIUM 8.5* 8.4*    Recent Labs  08/18/2015 1136  AST 27  ALT 49  ALKPHOS 59  BILITOT 0.4  PROT 6.4*  ALBUMIN 3.5    Recent Labs  08/13/2015 1136 08/20/2015 1726 08/26/15 0412  WBC 8.3 6.1 5.7  NEUTROABS 6.8*  --   --   HGB 12.2 12.2 11.1*  HCT 35.6 37.3 33.1*  MCV 92.2 93.3 93.4  PLT 302 300 296    Recent Labs  08/02/2015 1136 08/09/2015 1726 08/24/15 0006 08/24/15 0513  TROPONINI <0.03 <0.03 <0.03 <0.03   Invalid input(s): POCBNP No results for input(s): HGBA1C in the last 72 hours.   Weights: Filed Weights   08/24/15 0433 08/25/15 0445 08/26/15 0700  Weight: 158 lb 12.8 oz (72.031 kg) 158 lb 6.4 oz (71.85 kg) 159 lb 3.2 oz (72.213 kg)     Radiology/Studies:  Dg Chest 2 View  07/27/2015  CLINICAL DATA:  Dyspnea.  Nonproductive cough.  Chest tightness . EXAM: CHEST  2 VIEW COMPARISON:   08/20/2015 chest radiograph FINDINGS: Stable moderate eventration of the right hemidiaphragm. Stable cardiomediastinal silhouette with mild cardiomegaly. No pneumothorax. No pleural effusion. Stable mild curvilinear opacities at both lung bases, in keeping with mild bibasilar atelectasis. No pulmonary edema. Multiple thoracolumbar spine compression deformities are noted status post vertebroplasty. IMPRESSION: Stable chest radiograph with mild bibasilar atelectasis and chronic right hemidiaphragmatic eventration. Electronically Signed   By: Delbert Phenix M.D.   On: 07/29/2015 11:38   Dg Chest 2 View  08/20/2015  CLINICAL DATA:  Shortness of breath for 2 weeks, weakness today. History of asthma, hypertension, EXAM: CHEST  2 VIEW COMPARISON:  Acute abdominal series August 19, 2015 FINDINGS: Cardiomediastinal silhouette is unremarkable for this low inspiratory examination with crowded vascular markings. Elevated RIGHT hemidiaphragm. Similar bibasilar strandy densities without pleural effusion or focal consolidation. No pneumothorax. Multiple old thoracolumbar compression fractures, status post multilevel vertebral body cement augmentation. Partially imaged lumbar instrumentation, approximate L1 pedicle screws project above the superior endplate though, not tailored for evaluation. IMPRESSION: Low inspiratory examination with bibasilar atelectasis. Electronically Signed   By: Awilda Metro M.D.   On: 08/20/2015 22:44   Ct Soft Tissue Neck Wo Contrast  08/11/2015  CLINICAL DATA:  Facial swelling around mandible and extending into anterior neck for the last 3 days. EXAM: CT NECK WITHOUT CONTRAST TECHNIQUE: Multidetector CT imaging of the neck was performed following the standard protocol without intravenous contrast. COMPARISON:  None. FINDINGS: Both submandibular salivary glands appear relatively enlarged and edematous with surrounding inflammatory changes. Inflammation extends out towards the skin and is  slightly more prominent on the right compared to the left. Findings are suggestive of cellulitis involving the submandibular glands. No focal abscess identified. No obstructing salivary calculi or foreign bodies are identified by CT. The parotid glands appear normal. No focal masses or enlarged lymph nodes are seen. The visualized airway is normally patent. Paranasal sinuses and mastoid air cells are normally aerated. Diffuse degenerative disease noted of the cervical spine. There is a small nodule in the periphery of the left upper lobe abutting the lateral aspect of the major fissure and measuring approximately 4 x 6 mm. IMPRESSION: 1. Submandibular region cellulitis with inflammatory changes also seen involving both submandibular salivary glands. No focal abscess. 2. Incidental 4 x 6 mm left upper lobe pulmonary nodule. If the patient is at high risk for bronchogenic carcinoma, follow-up chest CT at 6-12 months is recommended. If the patient is at low risk for bronchogenic carcinoma, follow-up chest CT at 12 months is recommended. This recommendation follows the consensus statement: Guidelines for Management of Small Pulmonary Nodules Detected on CT Scans: A Statement from the Fleischner Society as published in Radiology 2005;237:395-400.  Electronically Signed   By: Irish Lack M.D.   On: 08/18/2015 16:51   Ct Chest Wo Contrast  08/24/2015  CLINICAL DATA:  81 year old female -followup pulmonary nodules. EXAM: CT CHEST WITHOUT CONTRAST TECHNIQUE: Multidetector CT imaging of the chest was performed following the standard protocol without IV contrast. COMPARISON:  03/24/2015 chest CT FINDINGS: Mediastinum/Nodes: Cardiomegaly and coronary/thoracic aortic atherosclerotic calcifications noted. No enlarged lymph nodes or pericardial effusion identified. Lungs/Pleura: Multiple bilateral pulmonary nodules are unchanged. Index nodules are as follows: 11 mm posteriomedial right lower lobe nodule (image 39) 5 mm right  lower lobe nodule (image 30) 5 mm left upper lobe nodule (image 15) No new or enlarging pulmonary nodules are identified. There is no evidence of airspace disease, mass, consolidation or endobronchial lesion. Minimal dependent and basilar atelectasis/scarring again noted. There is no evidence of pleural effusion Upper abdomen: Patient is status post cholecystectomy. A moderate hiatal hernia is again noted. Musculoskeletal: No acute abnormalities. Thoracic compression fractures and vertebral augmentation changes are again identified. IMPRESSION: Stable bilateral pulmonary nodules. Consider six-month CT followup to ensure 1 year stability. No evidence of acute abnormality within the chest. Moderate hiatal hernia, cardiomegaly, coronary disease and thoracic compression fractures/vertebral augmentation changes again noted. Electronically Signed   By: Harmon Pier M.D.   On: 08/24/2015 12:46   Ct Abdomen Pelvis W Contrast  08/21/2015  CLINICAL DATA:  Acute onset of generalized weakness and diffuse abdominal pain. Dyspnea. Initial encounter. EXAM: CT ABDOMEN AND PELVIS WITH CONTRAST TECHNIQUE: Multidetector CT imaging of the abdomen and pelvis was performed using the standard protocol following bolus administration of intravenous contrast. CONTRAST:  OMNIPAQUE IOHEXOL 300 MG/ML  SOLN COMPARISON:  CT of the abdomen and pelvis performed 11/24/2014 FINDINGS: Mild bibasilar atelectasis is noted. A small to moderate hiatal hernia is noted. The liver and spleen are unremarkable in appearance. The patient is status post cholecystectomy, with clips noted along the gallbladder fossa. The pancreas and adrenal glands are unremarkable. The kidneys are unremarkable in appearance. There is no evidence of hydronephrosis. No renal or ureteral stones are seen. Minimal nonspecific perinephric stranding is noted bilaterally. No free fluid is identified. The small bowel is unremarkable in appearance. The stomach is within normal  limits. No acute vascular abnormalities are seen. Mild calcification is noted along the abdominal aorta. Calcification is noted at the proximal renal arteries bilaterally. The appendix is normal in caliber and contains air, without evidence of appendicitis. Scattered diverticulosis is noted along the sigmoid colon, without evidence of diverticulitis. The bladder is mildly distended and grossly unremarkable. The uterus is unremarkable in appearance. The ovaries are grossly symmetric. No suspicious adnexal masses are seen. No inguinal lymphadenopathy is seen. No acute osseous abnormalities are identified. The patient is status post vertebroplasty at multiple levels along the lower thoracic and upper lumbar spine, and lumbar spinal fusion at L2-L5. The pedicle screws at L2 and L3 extend into the intervertebral discs. IMPRESSION: 1. No acute abnormality seen to explain the patient's symptoms. 2. Scattered diverticulosis along the sigmoid colon, without evidence of diverticulitis. 3. Small to moderate hiatal hernia noted. 4. Mild bibasilar atelectasis noted. 5. Mild calcification along the abdominal aorta, and at the proximal renal arteries bilaterally. 6. Numerous compression deformities along the thoracic and lumbar spine, status post lumbar spinal fusion at L2-L 5, and vertebroplasty at multiple levels. Pedicle screws at L2 and L3 extend into the intervertebral discs, grossly unchanged in appearance. Electronically Signed   By: Roanna Raider M.D.   On:  08/21/2015 00:28   Dg Abd Acute W/chest  08/19/2015  CLINICAL DATA:  Abnormal serum sodium levels. EXAM: DG ABDOMEN ACUTE W/ 1V CHEST COMPARISON:  07/26/2015 FINDINGS: The heart size is mildly enlarged. Normal pulmonary vascularity. Lung volumes are low. No focal airspace disease or consolidation in the lungs. No blunting of costophrenic angles. No pneumothorax. Mediastinal contours appear intact. There is a stable elevation of the right hemidiaphragm. Scattered  gas and stool in the colon. No small or large bowel distention. No free intra-abdominal air. No abnormal air-fluid levels. No radiopaque stones. Visualized bones appear intact. There are changes from prior kyphoplasty within the lower thoracic spine and posterior anterior fusion within the lumbosacral spine. IMPRESSION: No evidence of small-bowel obstruction. Low lung volumes with chronic elevation of the right hemidiaphragm. Stable enlargement of the cardiac silhouette. Electronically Signed   By: Ted Mcalpine M.D.   On: 08/19/2015 13:10     Assessment and Recommendation  81 y.o. female with known essential hypertension pulmonary hypertension coronary artery disease by CAT scan with significant hyponatremia and diastolic dysfunction congestive heart failure with edema needing further treatment options and no current evidence of myocardial infarction 1. Continue antihypertensive medication management for diastolic dysfunction heart failure and hypertension treatment including hydralazine and angiotensin receptor blocker as well as beta blocker 2. Patient will continue Lasix for current evidence of diastolic dysfunction heart failure and exam issues of lower field crackles to hypoxia and shortness of breath watching closely for chronic kidney disease and hyponatremia 4. No further cardiac diagnostics necessary at this time 4. Begin ambulation and follow for significant symptoms requiring further adjustments of medication and/or further intervention if necessary 5. Call if further questions  Signed, Arnoldo Hooker M.D. FACC

## 2015-08-26 NOTE — Consult Note (Signed)
[facility] [place] Critical Care Medicine Consultation     ASSESSMENT/PLAN   The patient is an 81 year old Caucasian female with history significant for adrenal insufficiency, SIADH, as well as obstructive sleep apnea, morbid obesity, chronic hypoventilation. Currently, she has had reduced mental status with low sodium levels, she was transferred to the ICU for closer monitoring. Subsequently, her respiratory status and mental status have improved slightly. Her sodium is trending upwards, she is on tolvaptan. We have started her on nocturnal BiPAP to help with her hypoventilation and bibasilar atelectasis.  PULMONARY  A: Acute hypoxic respiratory failure. -Acute on chronic hypercapnic respiratory failure, with chronic hypoventilation. -Possible element of obesity hypoventilation syndrome, with obstructive sleep apnea. -Elevated/paralyzed right diaphragm with likely restrictive lung disease. This may be due to morbid obesity and/or enlarged liver. -Lung nodules, which appear to have been stable for at least the last 6 months. -CT finding suggestive of congestive heart failure and pulmonary hypertension. -Hiatal hernia which may be causing some degree of reflux and aspiration. -Restless leg syndrome.  P:   -We'll start incentive spirometry for atelectasis. -Continue PPI for possible reflux. -Given the slightly elevated CO2 levels. Will start BiPAP daily at bedtime, which may also help with atelectasis. -Doubt pneumonia, though given the patient's immunosuppression would have a low threshold for starting antibiotics. -Pulmonary hypertension, likely secondary to mild LV dysfunction, as well as OSA, and obesity.  CARDIOVASCULAR  A: Suspect LV diastolic dysfunction. -Atrial fibrillation. P:  Might benefit from a trial of diuresis., Continue blood pressure control.  RENAL A: SIADH, currently on tolvaptan, nephrology following. -Adrenal insufficiency, currently on steroid replacement. P:     Continue tolvaptan per nephrology. -May benefit from a trial of diuresis.  GASTROINTESTINAL A:  GERD, hiatal hernia. -History of colitis, history of pancreatitis.  P:   Continue PPI  HEMATOLOGIC -   INFECTIOUS A: UTI with Escherichia coli. Sialodinitis. P:     ENDOCRINE A: History of adrenal insufficiency, currently on steroid replacement with cushingoid features. P:   Continue steroids, no need for stress dose at this point.  NEUROLOGIC --Altered mental status, likely due to metabolic encephalopathy, and multifactorial from above conditions. We'll continue to monitor.  CT chest from 08/24/15, comparison with 03/24/15; unchanged pulmonary nodules. Moderate to large hiatal hernia. Morbid obesity with bibasilar atelectasis. Enlarged liver with elevated/paralyzed right diaphragm.   Echocardiogram from 08/24/2015: Ejection fraction of 60 to 65%. Pulmonary artery systolic pressure elevated at 51 mmHg. ---------------------------------------  ---------------------------------------   Name: STEELE LEDONNE MRN: [id number] DOB: 28-Mar-1929    ADMISSION DATE:  September 06, 2015 CONSULTATION DATE:  08/26/2015  REFERRING MD :  Dr. Sherryll Burger  CHIEF COMPLAINT:  Altered mental status.   HISTORY OF PRESENT ILLNESS:   The patient is an 81 year old female who was admitted to the hospital and was on the general medical floor. She has a history of chronic SIADH, it was noted that her sodium was again low, she was started on tolvaptan and has been followed by the nephrology service. Her course is been completed by sialoadenitis, she is been seen by ENT and was started on IV clindamycin. The edition she had a recent UTI and was treated with antibiotics. Early today was noted that the patient was increasingly altered in her mental status and coupled with her reduced sodium. It was concerning that she may decompensate, she was therefore transferred to the intensive care unit. Currently she is on 3-4 L  nasal cannula Roxicet, saturation is around 95%. She is awake and alert 3.  She has no particular complaints other than some abdominal discomfort and some head congestion. She has no other particular complaints at this time. Subsequently, review of her lab testing shows mild respiratory acidosis with elevated CO2 level of 51 and a pH of 7.33. Sodium checked this afternoon was 123. This is similar to her previous level from early this morning and slightly increased from the evening level of 120.  PAST MEDICAL HISTORY :  Afib, DVT, colitis, diverticulitis, pancreatitis, OSA, RLS, lung nodules, vertebral compression fractures, chronic SIADH, history of adrenal insufficiency.  Past Medical History  Diagnosis Date  . Asthma   . Hypertension   . Arthritis   . Seizures [facility](HCC)    Past Surgical History  Procedure Laterality Date  . Joint replacement Left   . Cardiac catheterization    . Back surgery    . Kyphoplasty N/A 04/29/2015    Procedure: KYPHOPLASTY  T-12, T-10, T-7;  Surgeon: Kennedy BuckerMichael Menz, MD;  Location: [facility] ORS;  Service: Orthopedics;  Laterality: N/A;   Prior to Admission medications   Medication Sig Start Date End Date Taking? Authorizing Provider  acetaminophen (TYLENOL) 325 MG tablet Take 325 mg by mouth every 8 (eight) hours.    Yes Historical Provider, MD  acetaminophen (TYLENOL) 325 MG tablet Take 650 mg by mouth every 4 (four) hours as needed for mild pain, moderate pain, fever or headache.   Yes Historical Provider, MD  benzonatate (TESSALON) 100 MG capsule Take 100 mg by mouth 2 (two) times daily.   Yes Historical Provider, MD  Biotin 1000 MCG tablet Take 1,000 mcg by mouth daily.    Yes Historical Provider, MD  Cholecalciferol (VITAMIN D) 2000 UNITS tablet Take 2,000 Units by mouth daily.   Yes Historical Provider, MD  ciprofloxacin (CIPRO) 250 MG tablet Take 1 tablet (250 mg total) by mouth 2 (two) times daily. 08/21/15 08/27/15 Yes Irean HongJade J Sung, MD  hydrALAZINE (APRESOLINE) 50 MG  tablet Take 50 mg by mouth 2 (two) times daily.   Yes Historical Provider, MD  HYDROcodone-acetaminophen (NORCO/VICODIN) 5-325 MG per tablet Take 0.5 tablets by mouth 3 (three) times daily.    Yes Historical Provider, MD  HYDROcodone-acetaminophen (NORCO/VICODIN) 5-325 MG tablet Take 1 tablet by mouth every 6 (six) hours as needed for moderate pain or severe pain.   Yes Historical Provider, MD  hydrocortisone (CORTEF) 10 MG tablet Take 10 mg by mouth 2 (two) times daily.   Yes Historical Provider, MD  Iron-Vitamin C (VITRON-C) 65-125 MG TABS Take 1 tablet by mouth 3 (three) times a week. Take Monday - Wednesday - Friday   Yes Historical Provider, MD  levothyroxine (SYNTHROID, LEVOTHROID) 125 MCG tablet Take 125 mcg by mouth daily.    Yes Historical Provider, MD  lidocaine (LIDODERM) 5 % Place 1 patch onto the skin daily. Remove & Discard patch within 12 hours or as directed by MD   Yes Historical Provider, MD  loperamide (IMODIUM) 2 MG capsule Take 2 mg by mouth 2 (two) times daily as needed for diarrhea or loose stools.    Yes Historical Provider, MD  magnesium hydroxide (MILK OF MAGNESIA) 400 MG/5ML suspension Take 45 mLs by mouth daily as needed for mild constipation or moderate constipation.    Yes Historical Provider, MD  magnesium oxide (MAG-OX) 400 MG tablet Take 400 mg by mouth daily.   Yes Historical Provider, MD  metoprolol (LOPRESSOR) 100 MG tablet Take 100 mg by mouth 2 (two) times daily.   Yes Historical Provider, MD  montelukast (SINGULAIR) 10 MG tablet Take 10 mg by mouth at bedtime.   Yes Historical Provider, MD  olmesartan (BENICAR) 40 MG tablet Take 40 mg by mouth daily.   Yes Historical Provider, MD  ondansetron (ZOFRAN) 4 MG tablet Take 1 tablet (4 mg total) by mouth every 8 (eight) hours as needed for nausea or vomiting. 08/21/15  Yes Irean Hong, MD  ondansetron (ZOFRAN-ODT) 4 MG disintegrating tablet Take 4 mg by mouth 3 (three) times daily.    Yes Historical Provider, MD    pantoprazole (PROTONIX) 40 MG tablet Take 40 mg by mouth 2 (two) times daily. For 10 days then back down to 1 tablet daily 08/17/15  Yes Historical Provider, MD  polyethylene glycol (MIRALAX / GLYCOLAX) packet Take 17 g by mouth daily. Drink with water   Yes Historical Provider, MD  pramipexole (MIRAPEX) 0.25 MG tablet Take 0.25 mg by mouth 3 (three) times daily.   Yes Historical Provider, MD  sodium chloride 1 G tablet Take 1 g by mouth 2 (two) times daily.   Yes Historical Provider, MD  sucralfate (CARAFATE) 1 G tablet Take 1 g by mouth 3 (three) times daily before meals.   Yes Historical Provider, MD   Allergies  Allergen Reactions  . Strawberry Extract Anaphylaxis    Pt states her throat starts to close.   . Adhesive [Tape] Other (See Comments)    unknown  . Ciprofloxacin Other (See Comments)    unknown  . Codeine Other (See Comments)    unknown  . Furosemide Other (See Comments)    unknown  . Macrodantin [Nitrofurantoin Macrocrystal] Nausea Only  . Penicillins Hives  . Sulfa Antibiotics Other (See Comments)    unknown  . Valium [Diazepam] Other (See Comments)    Altered mental status  . Ziac [Bisoprolol-Hydrochlorothiazide] Other (See Comments)    unknown    FAMILY HISTORY:  Family History  Problem Relation Age of Onset  . Breast cancer Mother   . Brain cancer Father    SOCIAL HISTORY:  reports that she has never smoked. She has never used smokeless tobacco. She reports that she does not drink alcohol or use illicit drugs.  REVIEW OF SYSTEMS:   Constitutional: Feels well. Cardiovascular: No chest pain.  Pulmonary: Denies dyspnea.   The remainder of systems were reviewed and were found to be negative other than what is documented in the HPI.    VITAL SIGNS: Temp:  [97.7 F (36.5 C)-98 F (36.7 C)] 97.7 F (36.5 C) (11/03 1157) Pulse Rate:  [63-77] 63 (11/03 1157) Resp:  [17-20] 17 (11/03 1157) BP: (138-159)/(75-79) 138/79 mmHg (11/03 1157) SpO2:  [90 %-93 %]  90 % (11/03 1157) Weight:  [72.213 kg (159 lb 3.2 oz)] 72.213 kg (159 lb 3.2 oz) (11/03 0700) HEMODYNAMICS:   VENTILATOR SETTINGS:   INTAKE / OUTPUT:  Intake/Output Summary (Last 24 hours) at 08/26/15 1717 Last data filed at 08/26/15 1130  Gross per 24 hour  Intake    240 ml  Output    400 ml  Net   -160 ml    Physical Examination:   VS: BP 138/79 mmHg  Pulse 63  Temp(Src) 97.7 F (36.5 C) (Oral)  Resp 17  Ht 5\' 2"  (1.575 m)  Wt 72.213 kg (159 lb 3.2 oz)  BMI 29.11 kg/m2  SpO2 90%  General Appearance: No distress  Neuro:without focal findings, mental status, speech normal, HEENT: PERRLA, EOM intact, no ptosis, no other lesions noticed;  Pulmonary: normal breath  sounds., diaphragmatic excursion is reduced on the right side. CardiovascularNormal S1,S2.  No m/r/g.    Abdomen: Benign, Soft, non-tender, No masses, Renal:  No costovertebral tenderness  GU:  Not performed at this time. Endoc: No evident thyromegaly Skin:   warm, no rashes, no ecchymosis  Extremities: normal, no cyanosis, clubbing, no edema, warm with normal capillary refill.    LABS: Reviewed   LABORATORY PANEL:   CBC  Recent Labs Lab 08/26/15 0412  WBC 5.7  HGB 11.1*  HCT 33.1*  PLT 296    Chemistries   Recent Labs Lab 07/25/2015 1136  08/26/15 0412  NA 120*  < > 123*  K 4.3  < > 3.9  CL 87*  < > 92*  CO2 26  < > 25  GLUCOSE 126*  < > 116*  BUN 11  < > 13  CREATININE 0.51  < > 0.37*  CALCIUM 9.2  < > 8.4*  AST 27  --   --   ALT 49  --   --   ALKPHOS 59  --   --   BILITOT 0.4  --   --   < > = values in this interval not displayed.   Recent Labs Lab 08/26/15 1627  GLUCAP 113*    Recent Labs Lab 08/26/15 1630  PHART 7.33*  PCO2ART 51*  PO2ART 88    Recent Labs Lab 08/20/15 2210 08/05/2015 1136  AST 24 27  ALT 31 49  ALKPHOS 62 59  BILITOT 0.5 0.4  ALBUMIN 3.2* 3.5    Cardiac Enzymes  Recent Labs Lab 08/24/15 0513  TROPONINI <0.03    RADIOLOGY:  No  results found.     --Wells Guiles, MD.  Board Certified in Internal Medicine, Pulmonary Medicine, Critical Care Medicine, and Sleep Medicine.   [facility]  Santiago Glad, M.D.  Stephanie Acre, M.D.  Billy Fischer, M.D   08/26/2015, 5:17 PM  Critical Care Attestation.  I have personally obtained a history, examined the patient, evaluated laboratory and imaging results, formulated the assessment and plan and placed orders. The Patient requires high complexity decision making for assessment and support, frequent evaluation and titration of therapies, application of advanced monitoring technologies and extensive interpretation of multiple databases. The patient has critical illness that could lead imminently to failure of 1 or more organ systems and requires the highest level of physician preparedness to intervene.  Critical Care Time devoted to patient care services described in this note is 35 minutes and is exclusive of time spent in procedures.

## 2015-08-26 NOTE — Progress Notes (Signed)
Subjective:   Clinically she feels about the same Appetite is poor She is willing to try Ensure Sodium improved only up to 123  Objective:  Vital signs in last 24 hours:  Temp:  [97.7 F (36.5 C)-98 F (36.7 C)] 97.7 F (36.5 C) (11/03 1157) Pulse Rate:  [63-77] 63 (11/03 1157) Resp:  [17-20] 17 (11/03 1157) BP: (138-159)/(75-79) 138/79 mmHg (11/03 1157) SpO2:  [90 %-93 %] 90 % (11/03 1157) Weight:  [72.213 kg (159 lb 3.2 oz)] 72.213 kg (159 lb 3.2 oz) (11/03 0700)  Weight change: 0.363 kg (12.8 oz) Filed Weights   08/24/15 0433 08/25/15 0445 08/26/15 0700  Weight: 72.031 kg (158 lb 12.8 oz) 71.85 kg (158 lb 6.4 oz) 72.213 kg (159 lb 3.2 oz)    Intake/Output:    Intake/Output Summary (Last 24 hours) at 08/26/15 1533 Last data filed at 08/26/15 0923  Gross per 24 hour  Intake    240 ml  Output    400 ml  Net   -160 ml     Physical Exam: General:  no acute distress, cushingoid appearance   HEENT  redness over the upper neck, decreased hearing   Neck  supple   Pulm/lungs  bilateral diffuse crackles   CVS/Heart  irregular rhythm, no rub, soft systolic murmur   Abdomen:   obese, soft, nontender, nondistended   Extremities:  no peripheral edema   Neurologic:  alert,   Skin:  redness as described above   Access:        Basic Metabolic Panel:   Recent Labs Lab 08/20/15 2210 09/08/2015 1136 08-Sep-2015 1726 08/24/15 0006 08/24/15 0513 08/24/15 1114 08/25/15 1116 08/26/15 0412  NA 130* 120* 120* 120* 121* 120* 120* 123*  K 4.6 4.3  --   --   --   --  4.1 3.9  CL 98* 87*  --   --   --   --  86* 92*  CO2 26 26  --   --   --   --  28 25  GLUCOSE 131* 126*  --   --   --   --  123* 116*  BUN 14 11  --   --   --   --  13 13  CREATININE 0.65 0.51 0.48  --   --   --  0.63 0.37*  CALCIUM 8.9 9.2  --   --   --   --  8.5* 8.4*     CBC:  Recent Labs Lab 08/20/15 2210 08-Sep-2015 1136 09-08-2015 1726 08/26/15 0412  WBC 8.5 8.3 6.1 5.7  NEUTROABS 6.9* 6.8*  --   --    HGB 11.8* 12.2 12.2 11.1*  HCT 36.2 35.6 37.3 33.1*  MCV 93.5 92.2 93.3 93.4  PLT 308 302 300 296      Microbiology:  Recent Results (from the past 720 hour(s))  Urine culture     Status: None   Collection Time: 08/20/15 10:11 PM  Result Value Ref Range Status   Specimen Description URINE, RANDOM  Final   Special Requests NONE  Final   Culture >=100,000 COLONIES/mL ESCHERICHIA COLI  Final   Report Status 09-08-2015 FINAL  Final   Organism ID, Bacteria ESCHERICHIA COLI  Final      Susceptibility   Escherichia coli - MIC*    AMPICILLIN 4 SENSITIVE Sensitive     CEFTAZIDIME <=1 SENSITIVE Sensitive     CEFAZOLIN <=4 SENSITIVE Sensitive     CEFTRIAXONE <=1 SENSITIVE Sensitive  CIPROFLOXACIN <=0.25 SENSITIVE Sensitive     GENTAMICIN <=1 SENSITIVE Sensitive     IMIPENEM <=0.25 SENSITIVE Sensitive     TRIMETH/SULFA <=20 SENSITIVE Sensitive     PIP/TAZO Value in next row Sensitive      SENSITIVE<=4    * >=100,000 COLONIES/mL ESCHERICHIA COLI    Coagulation Studies: No results for input(s): LABPROT, INR in the last 72 hours.  Urinalysis: No results for input(s): COLORURINE, LABSPEC, PHURINE, GLUCOSEU, HGBUR, BILIRUBINUR, KETONESUR, PROTEINUR, UROBILINOGEN, NITRITE, LEUKOCYTESUR in the last 72 hours.  Invalid input(s): APPERANCEUR    Imaging: No results found.   Medications:   . sodium chloride 100 mL/hr at 08/26/15 1341   . aspirin EC  81 mg Oral Daily  . benzonatate  100 mg Oral BID  . cholecalciferol  2,000 Units Oral Daily  . ciprofloxacin  250 mg Oral BID  . clindamycin (CLEOCIN) IV  600 mg Intravenous 3 times per day  . docusate sodium  100 mg Oral BID  . enoxaparin (LOVENOX) injection  40 mg Subcutaneous Q24H  . feeding supplement (ENSURE ENLIVE)  237 mL Oral BID BM  . ferrous sulfate  325 mg Oral Once per day on Mon Wed Fri   And  . vitamin C  250 mg Oral Once per day on Mon Wed Fri  . furosemide  40 mg Intravenous Once  . hydrALAZINE  50 mg Oral BID   . hydrocortisone  10 mg Oral BID  . irbesartan  37.5 mg Oral Daily  . levothyroxine  150 mcg Oral QAC breakfast  . lidocaine  1 patch Transdermal Daily  . magnesium oxide  400 mg Oral Daily  . metoprolol  100 mg Oral BID  . montelukast  10 mg Oral QHS  . ondansetron  4 mg Oral TID  . pantoprazole  40 mg Oral BID  . polyethylene glycol  17 g Oral Daily  . pramipexole  0.25 mg Oral TID  . sodium chloride  1 g Oral BID  . sucralfate  1 g Oral TID AC  . tolvaptan  15 mg Oral Q24H   acetaminophen **OR** acetaminophen, HYDROcodone-acetaminophen, loperamide, magnesium hydroxide, ondansetron **OR** ondansetron (ZOFRAN) IV, ondansetron  Assessment/ Plan:  81 y.o. female With history of hypo-thyroidism, hypertension, degenerative joint disease, depression, TIAs, GERD, irritable bowel syndrome, restless leg syndrome, coronary disease, hyponatremia and mild renal insufficiency  1. Hyponatremia Chronic problem but her level was 133 back in August urine osmolality > S Osm suggesting SIADH  noncontrast CT shows no progression in pulm nodules Endocrine consultation to help assist in managing hypo-adrenalism Salt and Lasix did not have satisfactory improvement therefore, we will institute Tolvaptan   LOS: 3 Aashika Carta 11/3/20163:33 PM

## 2015-08-27 ENCOUNTER — Inpatient Hospital Stay: Payer: Medicare Other

## 2015-08-27 DIAGNOSIS — J9612 Chronic respiratory failure with hypercapnia: Secondary | ICD-10-CM

## 2015-08-27 DIAGNOSIS — R410 Disorientation, unspecified: Secondary | ICD-10-CM

## 2015-08-27 DIAGNOSIS — J9621 Acute and chronic respiratory failure with hypoxia: Secondary | ICD-10-CM

## 2015-08-27 DIAGNOSIS — J9620 Acute and chronic respiratory failure, unspecified whether with hypoxia or hypercapnia: Secondary | ICD-10-CM | POA: Insufficient documentation

## 2015-08-27 DIAGNOSIS — R063 Periodic breathing: Secondary | ICD-10-CM

## 2015-08-27 LAB — BLOOD GAS, ARTERIAL
ACID-BASE DEFICIT: 1.7 mmol/L (ref 0.0–2.0)
ACID-BASE DEFICIT: 0.7 mmol/L (ref 0.0–2.0)
ACID-BASE DEFICIT: 2.3 mmol/L — AB (ref 0.0–2.0)
ALLENS TEST (PASS/FAIL): POSITIVE — AB
ALLENS TEST (PASS/FAIL): POSITIVE — AB
Acid-base deficit: 3.1 mmol/L — ABNORMAL HIGH (ref 0.0–2.0)
Allens test (pass/fail): POSITIVE — AB
Allens test (pass/fail): POSITIVE — AB
BICARBONATE: 25.5 meq/L (ref 21.0–28.0)
BICARBONATE: 23.7 meq/L (ref 21.0–28.0)
Bicarbonate: 26.2 mEq/L (ref 21.0–28.0)
Bicarbonate: 20.9 mEq/L — ABNORMAL LOW (ref 21.0–28.0)
Delivery systems: POSITIVE
EXPIRATORY PAP: 8
FIO2: 0.32
FIO2: 0.25
FIO2: 0.24
FIO2: 0.24
Inspiratory PAP: 18
LHR: 12 {breaths}/min
MECHANICAL RATE: 8
O2 SAT: 86.4 %
O2 SAT: 97.5 %
O2 SAT: 95.6 %
O2 Saturation: 98.5 %
PATIENT TEMPERATURE: 37
PATIENT TEMPERATURE: 37
PCO2 ART: 53 mmHg — AB (ref 32.0–48.0)
PCO2 ART: 45 mmHg (ref 32.0–48.0)
PEEP/CPAP: 5 cmH2O
PEEP: 5 cmH2O
PH ART: 7.29 — AB (ref 7.350–7.450)
PH ART: 7.53 — AB (ref 7.350–7.450)
PH ART: 7.33 — AB (ref 7.350–7.450)
PO2 ART: 64 mmHg — AB (ref 83.0–108.0)
PO2 ART: 107 mmHg (ref 83.0–108.0)
Patient temperature: 37
Patient temperature: 37
RATE: 18 resp/min
VT: 500 mL
VT: 500 mL
pCO2 arterial: 67 mmHg — ABNORMAL HIGH (ref 32.0–48.0)
pCO2 arterial: 25 mmHg — ABNORMAL LOW (ref 32.0–48.0)
pH, Arterial: 7.2 — ABNORMAL LOW (ref 7.350–7.450)
pO2, Arterial: 102 mmHg (ref 83.0–108.0)
pO2, Arterial: 85 mmHg (ref 83.0–108.0)

## 2015-08-27 LAB — CBC
HEMATOCRIT: 35.7 % (ref 35.0–47.0)
HEMOGLOBIN: 11.9 g/dL — AB (ref 12.0–16.0)
MCH: 31.4 pg (ref 26.0–34.0)
MCHC: 33.3 g/dL (ref 32.0–36.0)
MCV: 94.3 fL (ref 80.0–100.0)
Platelets: 305 10*3/uL (ref 150–440)
RBC: 3.78 MIL/uL — ABNORMAL LOW (ref 3.80–5.20)
RDW: 15.4 % — AB (ref 11.5–14.5)
WBC: 5.1 10*3/uL (ref 3.6–11.0)

## 2015-08-27 LAB — BASIC METABOLIC PANEL
ANION GAP: 5 (ref 5–15)
BUN: 13 mg/dL (ref 6–20)
CHLORIDE: 94 mmol/L — AB (ref 101–111)
CO2: 28 mmol/L (ref 22–32)
Calcium: 8.6 mg/dL — ABNORMAL LOW (ref 8.9–10.3)
Creatinine, Ser: 0.71 mg/dL (ref 0.44–1.00)
GFR calc non Af Amer: >60 mL/min (ref >60)
GLUCOSE: 114 mg/dL — AB (ref 65–99)
POTASSIUM: 3.8 mmol/L (ref 3.5–5.1)
Sodium: 127 mmol/L — ABNORMAL LOW (ref 135–145)

## 2015-08-27 LAB — PROTEIN ELECTRO, RANDOM URINE
ALBUMIN ELP UR: 61.4 %
ALPHA-1-GLOBULIN, U: 6.3 %
Alpha-2-Globulin, U: 8.9 %
BETA GLOBULIN, U: 16.2 %
GAMMA GLOBULIN, U: 7.3 %
TOTAL PROTEIN, URINE-UPE24: 42.1 mg/dL

## 2015-08-27 LAB — SODIUM
SODIUM: 129 mmol/L — AB (ref 135–145)
Sodium: 128 mmol/L — ABNORMAL LOW (ref 135–145)
Sodium: 128 mmol/L — ABNORMAL LOW (ref 135–145)

## 2015-08-27 LAB — OSMOLALITY, URINE: OSMOLALITY UR: 327 mosm/kg (ref 300–900)

## 2015-08-27 MED ORDER — FREE WATER
25.0000 mL | Status: DC
  Administered 2015-08-27 – 2015-09-01 (×27): 25 mL

## 2015-08-27 MED ORDER — STERILE WATER FOR INJECTION IJ SOLN
INTRAMUSCULAR | Status: AC
  Administered 2015-08-27: 13:00:00
  Filled 2015-08-27: qty 10

## 2015-08-27 MED ORDER — DEXTROSE 5 % IV SOLN: 0.0000 ug/min | INTRAVENOUS | Status: DC

## 2015-08-27 MED ORDER — MIDAZOLAM HCL 2 MG/2ML IJ SOLN
2.0000 mg | INTRAMUSCULAR | Status: DC | PRN
  Administered 2015-08-27 (×3): 4 mg via INTRAVENOUS
  Administered 2015-08-28: 2 mg via INTRAVENOUS
  Administered 2015-08-28: 4 mg via INTRAVENOUS
  Administered 2015-08-28: 2 mg via INTRAVENOUS
  Administered 2015-08-28 – 2015-08-29 (×3): 4 mg via INTRAVENOUS
  Filled 2015-08-27: qty 4
  Filled 2015-08-27: qty 2
  Filled 2015-08-27 (×7): qty 4

## 2015-08-27 MED ORDER — MIDAZOLAM HCL 5 MG/5ML IJ SOLN
INTRAMUSCULAR | Status: AC
  Administered 2015-08-27: 4 mg via INTRAVENOUS
  Filled 2015-08-27: qty 5

## 2015-08-27 MED ORDER — MIDAZOLAM HCL 2 MG/2ML IJ SOLN: 2.0000 mg | INTRAMUSCULAR | Status: DC | PRN

## 2015-08-27 MED ORDER — MIDAZOLAM HCL 2 MG/2ML IJ SOLN
4.0000 mg | Freq: Once | INTRAMUSCULAR | Status: AC
  Administered 2015-08-27: 4 mg via INTRAVENOUS

## 2015-08-27 MED ORDER — MIDAZOLAM HCL 5 MG/5ML IJ SOLN
INTRAMUSCULAR | Status: AC
  Administered 2015-08-27: 4 mg
  Filled 2015-08-27: qty 5

## 2015-08-27 MED ORDER — FENTANYL 2500MCG IN NS 250ML (10MCG/ML) PREMIX INFUSION
10.0000 ug/h | INTRAVENOUS | Status: DC
  Administered 2015-08-27: 50 ug/h via INTRAVENOUS
  Administered 2015-08-27 – 2015-08-28 (×3): 10 ug/h via INTRAVENOUS
  Administered 2015-08-29: 125 ug/h via INTRAVENOUS
  Administered 2015-08-30: 150 ug/h via INTRAVENOUS
  Administered 2015-08-31: 10 ug/h via INTRAVENOUS
  Filled 2015-08-27 (×7): qty 250

## 2015-08-27 MED ORDER — NOREPINEPHRINE 4 MG/250ML-% IV SOLN
INTRAVENOUS | Status: AC
  Administered 2015-08-27: 5 ug/min via INTRAVENOUS
  Filled 2015-08-27: qty 250

## 2015-08-27 MED ORDER — ASPIRIN 81 MG PO CHEW
81.0000 mg | CHEWABLE_TABLET | Freq: Every day | ORAL | Status: DC
  Administered 2015-08-28 – 2015-09-01 (×5): 81 mg
  Filled 2015-08-27 (×5): qty 1

## 2015-08-27 MED ORDER — FENTANYL CITRATE (PF) 100 MCG/2ML IJ SOLN
INTRAMUSCULAR | Status: AC
  Administered 2015-08-27: 100 ug via INTRAVENOUS
  Filled 2015-08-27: qty 2

## 2015-08-27 MED ORDER — ANTISEPTIC ORAL RINSE SOLUTION (CORINZ)
7.0000 mL | Freq: Four times a day (QID) | OROMUCOSAL | Status: DC
  Administered 2015-08-27 – 2015-09-01 (×18): 7 mL via OROMUCOSAL
  Filled 2015-08-27 (×19): qty 7

## 2015-08-27 MED ORDER — METOPROLOL TARTRATE 50 MG PO TABS
50.0000 mg | ORAL_TABLET | Freq: Two times a day (BID) | ORAL | Status: DC
  Filled 2015-08-27: qty 1

## 2015-08-27 MED ORDER — METOPROLOL TARTRATE 50 MG PO TABS: 50.0000 mg | ORAL_TABLET | Freq: Two times a day (BID) | ORAL | Status: DC

## 2015-08-27 MED ORDER — VITAL HIGH PROTEIN PO LIQD
1000.0000 mL | ORAL | Status: DC
  Administered 2015-08-27 – 2015-08-29 (×3): 1000 mL

## 2015-08-27 MED ORDER — NOREPINEPHRINE 4 MG/250ML-% IV SOLN: 0.0000 ug/min | INTRAVENOUS | Status: DC

## 2015-08-27 MED ORDER — VECURONIUM BROMIDE 10 MG IV SOLR
10.0000 mg | Freq: Once | INTRAVENOUS | Status: AC
  Administered 2015-08-27: 10 mg via INTRAVENOUS

## 2015-08-27 MED ORDER — NOREPINEPHRINE 4 MG/250ML-% IV SOLN
0.0000 ug/min | INTRAVENOUS | Status: DC
  Administered 2015-08-27: 2 ug/min via INTRAVENOUS
  Administered 2015-08-27: 5 ug/min via INTRAVENOUS
  Administered 2015-08-29: 1 ug/min via INTRAVENOUS
  Filled 2015-08-27: qty 250

## 2015-08-27 MED ORDER — SENNOSIDES-DOCUSATE SODIUM 8.6-50 MG PO TABS
1.0000 | ORAL_TABLET | Freq: Two times a day (BID) | ORAL | Status: DC
  Administered 2015-08-27 – 2015-08-31 (×9): 1
  Filled 2015-08-27 (×9): qty 1

## 2015-08-27 MED ORDER — FENTANYL CITRATE (PF) 100 MCG/2ML IJ SOLN
100.0000 ug | Freq: Once | INTRAMUSCULAR | Status: AC
  Administered 2015-08-27: 100 ug via INTRAVENOUS

## 2015-08-27 MED ORDER — VECURONIUM BROMIDE 10 MG IV SOLR
INTRAVENOUS | Status: AC
  Administered 2015-08-27: 10 mg via INTRAVENOUS
  Filled 2015-08-27: qty 10

## 2015-08-27 MED ORDER — LEVOTHYROXINE SODIUM 100 MCG IV SOLR
75.0000 ug | Freq: Every day | INTRAVENOUS | Status: DC
  Administered 2015-08-28 – 2015-08-29 (×2): 75 ug via INTRAVENOUS
  Filled 2015-08-27 (×2): qty 5

## 2015-08-27 MED ORDER — MIDAZOLAM HCL 2 MG/2ML IJ SOLN: 4.0000 mg | INTRAMUSCULAR | Status: DC | PRN

## 2015-08-27 MED ORDER — CHLORHEXIDINE GLUCONATE 0.12% ORAL RINSE (MEDLINE KIT)
15.0000 mL | Freq: Two times a day (BID) | OROMUCOSAL | Status: DC
  Administered 2015-08-27 – 2015-09-01 (×10): 15 mL via OROMUCOSAL
  Filled 2015-08-27 (×12): qty 15

## 2015-08-27 NOTE — Procedures (Signed)
Endotracheal Intubation: Patient required placement of an artificial airway secondary to resp failure.   Consent: Emergent.   Hand washing performed prior to starting the procedure.   Medications administered for sedation prior to procedure: Midazolam 4 mg IV,  Vecuronium 10 mg IV, Fentanyl 100 mcg IV.   Procedure: A time out procedure was called and correct patient, name, & ID confirmed. Needed supplies and equipment were assembled and checked to include ETT, 10 ml syringe, Glidescope, Mac and Miller blades, suction, oxygen and bag mask valve, end tidal CO2 monitor. Patient was positioned to align the mouth and pharynx to facilitate visualization of the glottis.  Heart rate, SpO2 and blood pressure was continuously monitored during the procedure. Pre-oxygenation was conducted prior to intubation and endotracheal tube was placed through the vocal cords into the trachea.  During intubation an assistant applied gentle pressure to the cricoid cartilage.   The artificial airway was placed under direct visualization via glidescope route using a 8.0 ETT on the first attempt.    ETT was secured at 23 cm mark.    Placement was confirmed by auscuitation of lungs with good breath sounds bilaterally and no stomach sounds.  Condensation was noted on endotracheal tube.  Pulse ox %.  CO2 detector in place with appropriate color change.   Complications: None .   Operator: Raudel Bazen.   Chest radiograph ordered and pending.   Comments: OGT placed via glidescope.  [name] [name], M.D.  [facility]  Medical Director ICU-[facility] [place] Medical Director [facility] Cardio-Pulmonary Department       

## 2015-08-27 NOTE — Progress Notes (Addendum)
[facility] Critical Care Medicine Progess Note    ASSESSMENT/PLAN   Addendum: Called family son Emme Rosenau to give update and discuss possible change to DNR status. He referred me to son Torryn Fiske who is apparently healthcare PoA. Discussed case with son, Theodoro Grist, explained multiple chronic medical issues, worsening respiratory status. Explained possibility that given chronic respiratory issues it may be difficult to extubate her should we put her on the vent, though I could not be sure whether intubation in her would be permanent. He confirmed FULL CODE status and asked that we use life support if we feel it is necessary at this time  The patient is an 81 year old Caucasian female with history significant for adrenal insufficiency, SIADH, as well as obstructive sleep apnea, morbid obesity, chronic hypoventilation, diastolic CHF. Currently, she has had reduced mental status with low sodium levels, she was transferred to the ICU for closer monitoring. Subsequently, her respiratory status and mental status have improved slightly. Her sodium is trending upwards, she is on tolvaptan. We have started her on nocturnal BiPAP to help with her hypoventilation and bibasilar atelectasis.  PULMONARY  A: Acute hypoxic respiratory failure. -Acute on chronic hypercapnic respiratory failure, with chronic hypoventilation. -Possible element of obesity hypoventilation syndrome, with obstructive sleep apnea. -Elevated/paralyzed right diaphragm with likely restrictive lung disease. This may be due to morbid obesity and/or enlarged liver. -Lung nodules, which appear to have been stable for at least the last 6 months. -CT finding suggestive of congestive heart failure and pulmonary hypertension. -Hiatal hernia which may be causing some degree of reflux and aspiration. -Restless leg syndrome.  P:  - incentive spirometry for atelectasis. -Continue PPI for possible reflux. -Given the slightly elevated CO2 levels.  Continue BiPAP daily at bedtime, which may also help with atelectasis. -Doubt pneumonia, though given the patient's immunosuppression would have a low threshold for starting antibiotics. -Pulmonary hypertension, likely secondary to mild LV dysfunction, as well as OSA, and obesity.  CARDIOVASCULAR  A: Suspect LV diastolic dysfunction. -Atrial fibrillation. P:  Might benefit from a trial of diuresis., Continue blood pressure control.  RENAL A: SIADH, currently on tolvaptan, nephrology following. -Adrenal insufficiency, currently on steroid replacement. P:  Continue tolvaptan per nephrology. -May benefit from a trial of diuresis.  GASTROINTESTINAL A: GERD, hiatal hernia. -History of colitis, history of pancreatitis.  P:  Continue PPI  HEMATOLOGIC -   INFECTIOUS A: UTI with Escherichia coli. Sialodinitis. P:  -Currently on abx.   ENDOCRINE A: History of adrenal insufficiency, currently on steroid replacement with cushingoid features. P:  Continue steroids, no need for stress dose at this point.  NEUROLOGIC --Altered mental status, likely due to metabolic encephalopathy, and multifactorial from above conditions. We'll continue to monitor.  CT chest from 08/24/15, comparison with 03/24/15; unchanged pulmonary nodules. Moderate to large hiatal hernia. Morbid obesity with bibasilar atelectasis. Enlarged liver with elevated/paralyzed right diaphragm.   Echocardiogram from 08/24/2015: Ejection fraction of 60 to 65%. Pulmonary artery systolic pressure elevated at 51 mmHg. ---------------------------------------   ----------------------------------------   Name: NAKARI BRACKNELL MRN: [id number] DOB: 1928/12/27    ADMISSION DATE:  09-06-15  SUBJECTIVE:   The patient is currently resting on Bipap. Her respiratory pattern appears consistent with Cheyne-Stokes/Central sleep apnea.   Review of Systems:  Pt is currently lethargic and can not provide a review of  systems.    VITAL SIGNS: Temp:  [97.1 F (36.2 C)-97.7 F (36.5 C)] 97.1 F (36.2 C) (11/04 0000) Pulse Rate:  [55-75] 61 (11/04 0400) Resp:  [  17-37] 24 (11/04 0400) BP: (93-145)/(44-79) 104/50 mmHg (11/04 0400) SpO2:  [90 %-100 %] 100 % (11/04 0400) FiO2 (%):  [40 %] 40 % (11/04 0334) Weight:  [78.6 kg (173 lb 4.5 oz)] 78.6 kg (173 lb 4.5 oz) (11/04 0425) HEMODYNAMICS:   VENTILATOR SETTINGS: Vent Mode:  [-]  FiO2 (%):  [40 %] 40 % INTAKE / OUTPUT:  Intake/Output Summary (Last 24 hours) at 08/27/15 0832 Last data filed at 08/27/15 0500  Gross per 24 hour  Intake 4641.67 ml  Output   1350 ml  Net 3291.67 ml    PHYSICAL EXAMINATION: Physical Examination:   VS: BP 104/50 mmHg  Pulse 61  Temp(Src) 97.1 F (36.2 C) (Axillary)  Resp 24  Ht  (1.575 m)  Wt 78.6 kg (173 lb 4.5 oz)  BMI 31.69 kg/m2  SpO2 100%  General Appearance: No distress  Neuro:without focal findings, mental status normal. HEENT: PERRLA, EOM intact. Pulmonary: normal breath sounds   CardiovascularNormal S1,S2.  No m/r/g.   Abdomen: Benign, Soft, non-tender. Renal:  No costovertebral tenderness  GU:  Not performed at this time. Endocrine: No evident thyromegaly. Skin:   warm, no rashes, no ecchymosis  Extremities: normal, no cyanosis, clubbing.   LABS:   LABORATORY PANEL:   CBC  Recent Labs Lab 08/27/15 0439  WBC 5.1  HGB 11.9*  HCT 35.7  PLT 305    Chemistries   Recent Labs Lab 08/26/15 1653 08/27/15 0439  NA 123* 127*  K 3.8 3.8  CL 91* 94*  CO2 26 28  GLUCOSE 109* 114*  BUN 12 13  CREATININE 0.51 0.71  CALCIUM 8.6* 8.6*  AST 47*  --   ALT 71*  --   ALKPHOS 65  --   BILITOT 0.5  --      Recent Labs Lab 08/26/15 1627  GLUCAP 113*    Recent Labs Lab 08/26/15 1630  PHART 7.33*  PCO2ART 51*  PO2ART 88    Recent Labs Lab 08/20/15 2210 07/30/2015 1136 08/26/15 1653  AST 24 27 47*  ALT 31 49 71*  ALKPHOS 62 59 65  BILITOT 0.5 0.4 0.5  ALBUMIN  3.2* 3.5 3.3*    Cardiac Enzymes  Recent Labs Lab 08/24/15 0513  TROPONINI <0.03    RADIOLOGY:  Dg Chest 1 View  08/27/2015  CLINICAL DATA:  Shortness of Breath EXAM: CHEST 1 VIEW COMPARISON:  Chest radiograph August 23, 2015 and chest CT August 24, 2015 FINDINGS: There is stable elevation of the right hemidiaphragm. There is patchy bibasilar atelectatic change. Lungs elsewhere clear. Heart is upper normal in size with pulmonary vascularity within normal limits. No adenopathy. No pneumothorax. There is degenerative change in each shoulder. IMPRESSION: Stable elevation of the right hemidiaphragm. Patchy bibasilar atelectasis. No frank edema or consolidation. No change in cardiac silhouette. Electronically Signed   By: Bretta Bang III M.D.   On: 08/27/2015 07:14   Ct Head Wo Contrast  08/26/2015  CLINICAL DATA:  Altered mental status.  CVA.  Weakness. EXAM: CT HEAD WITHOUT CONTRAST TECHNIQUE: Contiguous axial images were obtained from the base of the skull through the vertex without intravenous contrast. COMPARISON:  08/02/2014 FINDINGS: Sinuses/Soft tissues: Surgical changes of bilateral lens extraction. Clear paranasal sinuses and mastoid air cells. Intracranial: Mild to moderate low density in the periventricular white matter likely related to small vessel disease. No mass lesion, hemorrhage, hydrocephalus, acute infarct, intra-axial, or extra-axial fluid collection. IMPRESSION: 1.  No acute intracranial abnormality. 2. Small vessel ischemic change.  Electronically Signed   By: Jeronimo GreavesKyle  Talbot M.D.   On: 08/26/2015 18:15       --Wells Guileseep Spencer Cardinal, MD.   [facility]   Santiago Gladavid Kasa, M.D.  Stephanie AcreVishal Mungal, M.D.  Billy Fischeravid Simonds, M.D  Critical Care Attestation.  I have personally obtained a history, examined the patient, evaluated laboratory and imaging results, formulated the assessment and plan and placed orders. The Patient requires high complexity decision  making for assessment and support, frequent evaluation and titration of therapies, application of advanced monitoring technologies and extensive interpretation of multiple databases. The patient has critical illness that could lead imminently to failure of 1 or more organ systems and requires the highest level of physician preparedness to intervene.  Critical Care Time devoted to patient care services described in this note is 35 minutes and is exclusive of time spent in procedures.

## 2015-08-27 NOTE — Progress Notes (Signed)
Per Dr. Belia HemanKasa, pt was not ventilating well. Pt intubated. OG placed with tube feedings to start. Left IJ central line placed. Pt sedated with fentanyl. Arnel Wymer E 3:35 PM 08/27/2015

## 2015-08-27 NOTE — Consult Note (Signed)
CC: AMS  HPI: [name] is an 81 y.o. female  with a known history of essential hypertension,  SIADH, dyastolic HF, hypothyroidism and recent diagnosis of Escherichia coli UTI on ciprofloxacin is presenting to the ED with a chief complaint of generalized weakness associated with some fogginess and confusion.  Neurology consulted for AMS    Past Medical History  Diagnosis Date  . Asthma   . Hypertension   . Arthritis   . Seizures [facility])     Past Surgical History  Procedure Laterality Date  . Joint replacement Left   . Cardiac catheterization    . Back surgery    . Kyphoplasty N/A 04/29/2015    Procedure: KYPHOPLASTY  T-12, T-10, T-7;  Surgeon: Kennedy Bucker, MD;  Location: [facility] ORS;  Service: Orthopedics;  Laterality: N/A;    Family History  Problem Relation Age of Onset  . Breast cancer Mother   . Brain cancer Father     Social History:  reports that she has never smoked. She has never used smokeless tobacco. She reports that she does not drink alcohol or use illicit drugs.  Allergies  Allergen Reactions  . Strawberry Extract Anaphylaxis    Pt states her throat starts to close.   . Adhesive [Tape] Other (See Comments)    unknown  . Codeine Other (See Comments)    unknown  . Furosemide Other (See Comments)    unknown  . Macrodantin [Nitrofurantoin Macrocrystal] Nausea Only  . Penicillins Hives  . Sulfa Antibiotics Other (See Comments)    unknown  . Valium [Diazepam] Other (See Comments)    Altered mental status  . Ziac [Bisoprolol-Hydrochlorothiazide] Other (See Comments)    unknown    Medications: I have reviewed the patient's current medications.  ROS: Unable to obtain due to current mental status   Physical Examination: Blood pressure 110/44, pulse 71, temperature 97 F (36.1 C), temperature source Axillary, resp. rate 24, height  (1.575 m), weight 173 lb 4.5 oz (78.6 kg), SpO2 93 %.  Pt is on BiPap She is able to track me through the  room EOM intact Visual fields appear to be intact as she is following commands Pt does have generalized weakness b/l upper and lower extremities  She clearly follows commands.   Laboratory Studies:   Basic Metabolic Panel:  Recent Labs Lab 08/01/2015 1136 08/20/2015 1726  08/25/15 1116 08/26/15 0412 08/26/15 1653 08/27/15 0439 08/27/15 1055  NA 120* 120*  < > 120* 123* 123* 127* 128*  K 4.3  --   --  4.1 3.9 3.8 3.8  --   CL 87*  --   --  86* 92* 91* 94*  --   CO2 26  --   --  --   GLUCOSE 126*  --   --  123* 116* 109* 114*  --   BUN 11  --   --  --   CREATININE 0.51 0.48  --  0.63 0.37* 0.51 0.71  --   CALCIUM 9.2  --   --  8.5* 8.4* 8.6* 8.6*  --   < > = values in this interval not displayed.  Liver Function Tests:  Recent Labs Lab 08/20/15 2210 08/02/2015 1136 08/26/15 1653  AST 24 27 47*  ALT 31 49 71*  ALKPHOS 62 59 65  BILITOT 0.5 0.4 0.5  PROT 5.9* 6.4* 5.9*  ALBUMIN 3.2* 3.5 3.3*    Recent Labs  Lab 08/20/15 2210 06-Mar-2015 1136  LIPASE 25 22    Recent Labs Lab 08/26/15 1653  AMMONIA 33    CBC:  Recent Labs Lab 08/20/15 2210 06-Mar-2015 1136 06-Mar-2015 1726 08/26/15 0412 08/26/15 1653 08/27/15 0439  WBC 8.5 8.3 6.1 5.7 5.5 5.1  NEUTROABS 6.9* 6.8*  --   --   --   --   HGB 11.8* 12.2 12.2 11.1* 11.4* 11.9*  HCT 36.2 35.6 37.3 33.1* 33.8* 35.7  MCV 93.5 92.2 93.3 93.4 92.5 94.3  PLT 308 302 300 296 301 305    Cardiac Enzymes:  Recent Labs Lab 08/20/15 2210 06-Mar-2015 1136 06-Mar-2015 1726 08/24/15 0006 08/24/15 0513  TROPONINI <0.03 <0.03 <0.03 <0.03 <0.03    BNP: Invalid input(s): POCBNP  CBG:  Recent Labs Lab 08/26/15 1627  GLUCAP 113*    Microbiology: Results for orders placed or performed during the hospital encounter of 06-Mar-2015  MRSA PCR Screening     Status: None   Collection Time: 08/26/15  4:00 PM  Result Value Ref Range Status   MRSA by PCR NEGATIVE NEGATIVE Final    Comment:        The  GeneXpert MRSA Assay (FDA approved for NASAL specimens only), is one component of a comprehensive MRSA colonization surveillance program. It is not intended to diagnose MRSA infection nor to guide or monitor treatment for MRSA infections.     Coagulation Studies: No results for input(s): LABPROT, INR in the last 72 hours.  Urinalysis:  Recent Labs Lab 08/20/15 2211 06-Mar-2015 1208  COLORURINE YELLOW* YELLOW*  LABSPEC 1.030 1.015  PHURINE 5.0 7.0  GLUCOSEU NEGATIVE NEGATIVE  HGBUR NEGATIVE NEGATIVE  BILIRUBINUR NEGATIVE NEGATIVE  KETONESUR NEGATIVE NEGATIVE  PROTEINUR NEGATIVE 30*  NITRITE POSITIVE* NEGATIVE  LEUKOCYTESUR 1+* NEGATIVE    Lipid Panel:  No results found for: CHOL, TRIG, HDL, CHOLHDL, VLDL, LDLCALC  HgbA1C:  Lab Results  Component Value Date   HGBA1C 6.1 06/24/2014    Urine Drug Screen:  No results found for: LABOPIA, COCAINSCRNUR, LABBENZ, AMPHETMU, THCU, LABBARB  Alcohol Level: No results for input(s): ETH in the last 168 hours.  Imaging: Dg Chest 1 View  08/27/2015  CLINICAL DATA:  Shortness of Breath EXAM: CHEST 1 VIEW COMPARISON:  Chest radiograph August 23, 2015 and chest CT August 24, 2015 FINDINGS: There is stable elevation of the right hemidiaphragm. There is patchy bibasilar atelectatic change. Lungs elsewhere clear. Heart is upper normal in size with pulmonary vascularity within normal limits. No adenopathy. No pneumothorax. There is degenerative change in each shoulder. IMPRESSION: Stable elevation of the right hemidiaphragm. Patchy bibasilar atelectasis. No frank edema or consolidation. No change in cardiac silhouette. Electronically Signed   By: Bretta BangWilliam  Woodruff III M.D.   On: 08/27/2015 07:14   Ct Head Wo Contrast  08/26/2015  CLINICAL DATA:  Altered mental status.  CVA.  Weakness. EXAM: CT HEAD WITHOUT CONTRAST TECHNIQUE: Contiguous axial images were obtained from the base of the skull through the vertex without intravenous contrast.  COMPARISON:  08/02/2014 FINDINGS: Sinuses/Soft tissues: Surgical changes of bilateral lens extraction. Clear paranasal sinuses and mastoid air cells. Intracranial: Mild to moderate low density in the periventricular white matter likely related to small vessel disease. No mass lesion, hemorrhage, hydrocephalus, acute infarct, intra-axial, or extra-axial fluid collection. IMPRESSION: 1.  No acute intracranial abnormality. 2. Small vessel ischemic change. Electronically Signed   By: Jeronimo GreavesKyle  Talbot M.D.   On: 08/26/2015 18:15     Assessment/Plan:  81 y.o. female  with  a known history of essential hypertension,  SIADH, dyastolic HF, hypothyroidism and recent diagnosis of Escherichia coli UTI on ciprofloxacin is presenting to the ED with a chief complaint of generalized weakness associated with some fogginess and confusion.  Neurology consulted for AMS  Pt clearly follows commands. She is able to track me through the room. Does show me 2 fingers on each hand and nods appropriately.    The currently mental status is likely a combination of medical problems leading to metabolic encephalopathy that includes  Hyponatremia in the setting of SIADH, hypercapnic respiratory failure as well as adrenal insuffuciency in need of hydrocortisone.   Steroid induce neuropathy can contribute to generalized weakness but likely not so rapidly.  She has generalized weakness but no focal deficits.  Do not think there is a need for any further imaging at this point.  Pauletta Browns  08/27/2015, 1:58 PM

## 2015-08-27 NOTE — Procedures (Signed)
Central Venous Catheter Placement: Indication: Patient receiving vesicant or irritant drug.; Patient receiving intravenous therapy for longer than 5 days.; Patient has limited or no vascular access.   Consent:emergent  Risks and benefits explained in detail including risk of infection, bleeding, respiratory failure and death..   Hand washing performed prior to starting the procedure.   Procedure: An active timeout was performed and correct patient, name, & ID confirmed.  After explaining risk and benefits, patient was positioned correctly for central venous access. Patient was prepped using strict sterile technique including chlorohexadine preps, sterile drape, sterile gown and sterile gloves.  The area was prepped, draped and anesthetized in the usual sterile manner. Patient comfort was obtained.  A triple lumen catheter was placed in LEFT Internal Jugular Vein There was good blood return, catheter caps were placed on lumens, catheter flushed easily, the line was secured and a sterile dressing and BIO-PATCH applied.   Ultrasound was used to visualize vasculature and guidance of needle.   Number of Attempts: 1 Complications:none Estimated Blood Loss: none Chest Radiograph indicated and ordered.  Operator: Bela Nyborg.   [name], M.D.  [facility]  Medical Director ICU-[facility] [place] Medical Director [facility] Cardio-Pulmonary Department     

## 2015-08-27 NOTE — Progress Notes (Signed)
[facility] Cardiology [facility] Encounter Note  Patient: [name] / Admit Date: 09-21-15 / Date of Encounter: 08/27/2015, 8:05 AM   Subjective: Mental status change yesterday with concerns of hypoxia and or hyponatremia Review of Systems: Positive for: Shortness of breath and weakness Negative for: Vision change, hearing change, syncope, dizziness, nausea, vomiting,diarrhea, bloody stool, stomach pain, cough, congestion, diaphoresis, urinary frequency, urinary pain,skin lesions, skin rashes Others previously listed  Objective: Telemetry: Normal sinus rhythm Physical Exam: Blood pressure 104/50, pulse 61, temperature 97.1 F (36.2 C), temperature source Axillary, resp. rate 24, height  (1.575 m), weight 173 lb 4.5 oz (78.6 kg), SpO2 100 %. Body mass index is 31.69 kg/(m^2). General: Well developed, well nourished, in no acute distress. Head: Normocephalic, atraumatic, sclera non-icteric, no xanthomas, nares are without discharge. Neck: No apparent masses Lungs: Normal respirations with few wheezes, no rhonchi, no rales , basilar crackles   Heart: Regular rate and rhythm, normal S1 S2, no murmur, no rub, no gallop, PMI is diffuse carotid upstroke normal without bruit, jugular venous pressure normal Abdomen: Soft, non-tender, non-distended with normoactive bowel sounds. No hepatosplenomegaly. Abdominal aorta is normal size without bruit Extremities: Trace to 1+ edema, no clubbing, no cyanosis, no ulcers,  Peripheral: 2+ radial, 2+ femoral, 2+ dorsal pedal pulses Neuro: not Alert and oriented. Moves all extremities spontaneously. Psych:  Responds to questions     Intake/Output Summary (Last 24 hours) at 08/27/15 0805 Last data filed at 08/27/15 0500  Gross per 24 hour  Intake 4881.67 ml  Output   1350 ml  Net 3531.67 ml    Inpatient Medications:  . aspirin EC  81 mg Oral Daily  . benzonatate  100 mg Oral BID  . cholecalciferol  2,000 Units Oral Daily  .  ciprofloxacin  250 mg Oral BID  . clindamycin (CLEOCIN) IV  600 mg Intravenous 3 times per day  . docusate sodium  100 mg Oral BID  . enoxaparin (LOVENOX) injection  40 mg Subcutaneous Q24H  . feeding supplement (ENSURE ENLIVE)  237 mL Oral BID BM  . ferrous sulfate  325 mg Oral Once per day on Mon Wed Fri   And  . vitamin C  250 mg Oral Once per day on Mon Wed Fri  . furosemide  40 mg Intravenous Once  . hydrALAZINE  50 mg Oral BID  . hydrocortisone  10 mg Oral BID  . irbesartan  37.5 mg Oral Daily  . levothyroxine  150 mcg Oral QAC breakfast  . lidocaine  1 patch Transdermal Daily  . magnesium oxide  400 mg Oral Daily  . metoprolol  100 mg Oral BID  . montelukast  10 mg Oral QHS  . ondansetron  4 mg Oral TID  . pantoprazole  40 mg Oral BID  . polyethylene glycol  17 g Oral Daily  . pramipexole  0.25 mg Oral TID  . sodium chloride  1 g Oral BID  . sucralfate  1 g Oral TID AC  . tolvaptan  15 mg Oral Q24H   Infusions:  . sodium chloride 100 mL/hr (08/27/15 0105)    Labs:  Recent Labs  08/26/15 1653 08/27/15 0439  NA 123* 127*  K 3.8 3.8  CL 91* 94*  CO2 26 28  GLUCOSE 109* 114*  BUN 12 13  CREATININE 0.51 0.71  CALCIUM 8.6* 8.6*    Recent Labs  08/26/15 1653  AST 47*  ALT 71*  ALKPHOS 65  BILITOT 0.5  PROT 5.9*  ALBUMIN 3.3*    Recent Labs  08/26/15 1653 08/27/15 0439  WBC 5.5 5.1  HGB 11.4* 11.9*  HCT 33.8* 35.7  MCV 92.5 94.3  PLT 301 305   No results for input(s): CKTOTAL, CKMB, TROPONINI in the last 72 hours. Invalid input(s): POCBNP No results for input(s): HGBA1C in the last 72 hours.   Weights: Filed Weights   08/25/15 0445 08/26/15 0700 08/27/15 0425  Weight: 158 lb 6.4 oz (71.85 kg) 159 lb 3.2 oz (72.213 kg) 173 lb 4.5 oz (78.6 kg)     Radiology/Studies:  Dg Chest 1 View  08/27/2015  CLINICAL DATA:  Shortness of Breath EXAM: CHEST 1 VIEW COMPARISON:  Chest radiograph August 23, 2015 and chest CT August 24, 2015 FINDINGS: There  is stable elevation of the right hemidiaphragm. There is patchy bibasilar atelectatic change. Lungs elsewhere clear. Heart is upper normal in size with pulmonary vascularity within normal limits. No adenopathy. No pneumothorax. There is degenerative change in each shoulder. IMPRESSION: Stable elevation of the right hemidiaphragm. Patchy bibasilar atelectasis. No frank edema or consolidation. No change in cardiac silhouette. Electronically Signed   By: Bretta BangWilliam  Woodruff III M.D.   On: 08/27/2015 07:14   Dg Chest 2 View  Jan 06, 2015  CLINICAL DATA:  Dyspnea.  Nonproductive cough.  Chest tightness . EXAM: CHEST  2 VIEW COMPARISON:  08/20/2015 chest radiograph FINDINGS: Stable moderate eventration of the right hemidiaphragm. Stable cardiomediastinal silhouette with mild cardiomegaly. No pneumothorax. No pleural effusion. Stable mild curvilinear opacities at both lung bases, in keeping with mild bibasilar atelectasis. No pulmonary edema. Multiple thoracolumbar spine compression deformities are noted status post vertebroplasty. IMPRESSION: Stable chest radiograph with mild bibasilar atelectasis and chronic right hemidiaphragmatic eventration. Electronically Signed   By: Delbert PhenixJason A Poff M.D.   On: Jan 06, 2015 11:38   Dg Chest 2 View  08/20/2015  CLINICAL DATA:  Shortness of breath for 2 weeks, weakness today. History of asthma, hypertension, EXAM: CHEST  2 VIEW COMPARISON:  Acute abdominal series August 19, 2015 FINDINGS: Cardiomediastinal silhouette is unremarkable for this low inspiratory examination with crowded vascular markings. Elevated RIGHT hemidiaphragm. Similar bibasilar strandy densities without pleural effusion or focal consolidation. No pneumothorax. Multiple old thoracolumbar compression fractures, status post multilevel vertebral body cement augmentation. Partially imaged lumbar instrumentation, approximate L1 pedicle screws project above the superior endplate though, not tailored for evaluation.  IMPRESSION: Low inspiratory examination with bibasilar atelectasis. Electronically Signed   By: Awilda Metroourtnay  Bloomer M.D.   On: 08/20/2015 22:44   Ct Head Wo Contrast  08/26/2015  CLINICAL DATA:  Altered mental status.  CVA.  Weakness. EXAM: CT HEAD WITHOUT CONTRAST TECHNIQUE: Contiguous axial images were obtained from the base of the skull through the vertex without intravenous contrast. COMPARISON:  08/02/2014 FINDINGS: Sinuses/Soft tissues: Surgical changes of bilateral lens extraction. Clear paranasal sinuses and mastoid air cells. Intracranial: Mild to moderate low density in the periventricular white matter likely related to small vessel disease. No mass lesion, hemorrhage, hydrocephalus, acute infarct, intra-axial, or extra-axial fluid collection. IMPRESSION: 1.  No acute intracranial abnormality. 2. Small vessel ischemic change. Electronically Signed   By: Jeronimo GreavesKyle  Talbot M.D.   On: 08/26/2015 18:15   Ct Soft Tissue Neck Wo Contrast  Jan 06, 2015  CLINICAL DATA:  Facial swelling around mandible and extending into anterior neck for the last 3 days. EXAM: CT NECK WITHOUT CONTRAST TECHNIQUE: Multidetector CT imaging of the neck was performed following the standard protocol without intravenous contrast. COMPARISON:  None. FINDINGS: Both submandibular salivary glands appear  relatively enlarged and edematous with surrounding inflammatory changes. Inflammation extends out towards the skin and is slightly more prominent on the right compared to the left. Findings are suggestive of cellulitis involving the submandibular glands. No focal abscess identified. No obstructing salivary calculi or foreign bodies are identified by CT. The parotid glands appear normal. No focal masses or enlarged lymph nodes are seen. The visualized airway is normally patent. Paranasal sinuses and mastoid air cells are normally aerated. Diffuse degenerative disease noted of the cervical spine. There is a small nodule in the periphery of the  left upper lobe abutting the lateral aspect of the major fissure and measuring approximately 4 x 6 mm. IMPRESSION: 1. Submandibular region cellulitis with inflammatory changes also seen involving both submandibular salivary glands. No focal abscess. 2. Incidental 4 x 6 mm left upper lobe pulmonary nodule. If the patient is at high risk for bronchogenic carcinoma, follow-up chest CT at 6-12 months is recommended. If the patient is at low risk for bronchogenic carcinoma, follow-up chest CT at 12 months is recommended. This recommendation follows the consensus statement: Guidelines for Management of Small Pulmonary Nodules Detected on CT Scans: A Statement from the Fleischner Society as published in Radiology 2005;237:395-400. Electronically Signed   By: Irish Lack M.D.   On: 08/20/2015 16:51   Ct Chest Wo Contrast  08/24/2015  CLINICAL DATA:  81 year old female -followup pulmonary nodules. EXAM: CT CHEST WITHOUT CONTRAST TECHNIQUE: Multidetector CT imaging of the chest was performed following the standard protocol without IV contrast. COMPARISON:  03/24/2015 chest CT FINDINGS: Mediastinum/Nodes: Cardiomegaly and coronary/thoracic aortic atherosclerotic calcifications noted. No enlarged lymph nodes or pericardial effusion identified. Lungs/Pleura: Multiple bilateral pulmonary nodules are unchanged. Index nodules are as follows: 11 mm posteriomedial right lower lobe nodule (image 39) 5 mm right lower lobe nodule (image 30) 5 mm left upper lobe nodule (image 15) No new or enlarging pulmonary nodules are identified. There is no evidence of airspace disease, mass, consolidation or endobronchial lesion. Minimal dependent and basilar atelectasis/scarring again noted. There is no evidence of pleural effusion Upper abdomen: Patient is status post cholecystectomy. A moderate hiatal hernia is again noted. Musculoskeletal: No acute abnormalities. Thoracic compression fractures and vertebral augmentation changes are again  identified. IMPRESSION: Stable bilateral pulmonary nodules. Consider six-month CT followup to ensure 1 year stability. No evidence of acute abnormality within the chest. Moderate hiatal hernia, cardiomegaly, coronary disease and thoracic compression fractures/vertebral augmentation changes again noted. Electronically Signed   By: Harmon Pier M.D.   On: 08/24/2015 12:46   Ct Abdomen Pelvis W Contrast  08/21/2015  CLINICAL DATA:  Acute onset of generalized weakness and diffuse abdominal pain. Dyspnea. Initial encounter. EXAM: CT ABDOMEN AND PELVIS WITH CONTRAST TECHNIQUE: Multidetector CT imaging of the abdomen and pelvis was performed using the standard protocol following bolus administration of intravenous contrast. CONTRAST:  OMNIPAQUE IOHEXOL 300 MG/ML  SOLN COMPARISON:  CT of the abdomen and pelvis performed 11/24/2014 FINDINGS: Mild bibasilar atelectasis is noted. A small to moderate hiatal hernia is noted. The liver and spleen are unremarkable in appearance. The patient is status post cholecystectomy, with clips noted along the gallbladder fossa. The pancreas and adrenal glands are unremarkable. The kidneys are unremarkable in appearance. There is no evidence of hydronephrosis. No renal or ureteral stones are seen. Minimal nonspecific perinephric stranding is noted bilaterally. No free fluid is identified. The small bowel is unremarkable in appearance. The stomach is within normal limits. No acute vascular abnormalities are seen. Mild calcification is  noted along the abdominal aorta. Calcification is noted at the proximal renal arteries bilaterally. The appendix is normal in caliber and contains air, without evidence of appendicitis. Scattered diverticulosis is noted along the sigmoid colon, without evidence of diverticulitis. The bladder is mildly distended and grossly unremarkable. The uterus is unremarkable in appearance. The ovaries are grossly symmetric. No suspicious adnexal masses are seen. No  inguinal lymphadenopathy is seen. No acute osseous abnormalities are identified. The patient is status post vertebroplasty at multiple levels along the lower thoracic and upper lumbar spine, and lumbar spinal fusion at L2-L5. The pedicle screws at L2 and L3 extend into the intervertebral discs. IMPRESSION: 1. No acute abnormality seen to explain the patient's symptoms. 2. Scattered diverticulosis along the sigmoid colon, without evidence of diverticulitis. 3. Small to moderate hiatal hernia noted. 4. Mild bibasilar atelectasis noted. 5. Mild calcification along the abdominal aorta, and at the proximal renal arteries bilaterally. 6. Numerous compression deformities along the thoracic and lumbar spine, status post lumbar spinal fusion at L2-L 5, and vertebroplasty at multiple levels. Pedicle screws at L2 and L3 extend into the intervertebral discs, grossly unchanged in appearance. Electronically Signed   By: Roanna Raider M.D.   On: 08/21/2015 00:28   Dg Abd Acute W/chest  08/19/2015  CLINICAL DATA:  Abnormal serum sodium levels. EXAM: DG ABDOMEN ACUTE W/ 1V CHEST COMPARISON:  07/26/2015 FINDINGS: The heart size is mildly enlarged. Normal pulmonary vascularity. Lung volumes are low. No focal airspace disease or consolidation in the lungs. No blunting of costophrenic angles. No pneumothorax. Mediastinal contours appear intact. There is a stable elevation of the right hemidiaphragm. Scattered gas and stool in the colon. No small or large bowel distention. No free intra-abdominal air. No abnormal air-fluid levels. No radiopaque stones. Visualized bones appear intact. There are changes from prior kyphoplasty within the lower thoracic spine and posterior anterior fusion within the lumbosacral spine. IMPRESSION: No evidence of small-bowel obstruction. Low lung volumes with chronic elevation of the right hemidiaphragm. Stable enlargement of the cardiac silhouette. Electronically Signed   By: Ted Mcalpine M.D.    On: 08/19/2015 13:10     Assessment and Recommendation  81 y.o. female with known essential hypertension pulmonary hypertension coronary artery disease by CAT scan with significant hyponatremia and diastolic dysfunction congestive heart failure with edema relatively controlled on appropriate meds and hemodynamically stable currently with sleep apnea and respiratory failure concerns 1. Continue antihypertensive medication management for diastolic dysfunction heart failure and hypertension treatment including hydralazine and angiotensin receptor blocker as well as beta blocker if able 2. Treatment of respiratory failure and hypoixa  4. No further cardiac diagnostics necessary at this time 4. Begin ambulation and follow for significant symptoms requiring further adjustments of medication and/or further intervention if necessary 5. Call if further questions  Signed, Arnoldo Hooker M.D. FACC

## 2015-08-27 NOTE — Progress Notes (Signed)
Subjective:   Transferred to ICU yesterday Appetite is poor Sodium improved to 127 tolvaptan started yesterday   Objective:  Vital signs in last 24 hours:  Temp:  [97 F (36.1 C)-97.7 F (36.5 C)] 97 F (36.1 C) (11/04 0700) Pulse Rate:  [55-75] 61 (11/04 0800) Resp:  [17-37] 26 (11/04 0800) BP: (89-145)/(44-79) 89/45 mmHg (11/04 0800) SpO2:  [90 %-100 %] 99 % (11/04 0800) FiO2 (%):  [40 %] 40 % (11/04 0334) Weight:  [78.6 kg (173 lb 4.5 oz)] 78.6 kg (173 lb 4.5 oz) (11/04 0425)  Weight change: 6.387 kg (14 lb 1.3 oz) Filed Weights   08/25/15 0445 08/26/15 0700 08/27/15 0425  Weight: 71.85 kg (158 lb 6.4 oz) 72.213 kg (159 lb 3.2 oz) 78.6 kg (173 lb 4.5 oz)    Intake/Output:    Intake/Output Summary (Last 24 hours) at 08/27/15 0912 Last data filed at 08/27/15 0500  Gross per 24 hour  Intake 4641.67 ml  Output   1350 ml  Net 3291.67 ml     Physical Exam: General:  cushingoid appearance   HEENT  redness over the upper neck, decreased hearing   Neck  supple   Pulm/lungs  bilateral diffuse crackles , OSA breathing pattern  CVS/Heart  irregular rhythm, no rub, soft systolic murmur   Abdomen:   obese, soft, nontender, nondistended   Extremities:  no peripheral edema   Neurologic:  lethargic  Skin:  redness as described above   Access:        Basic Metabolic Panel:   Recent Labs Lab 08/13/2015 1136 08/14/2015 1726  08/24/15 1114 08/25/15 1116 08/26/15 0412 08/26/15 1653 08/27/15 0439  NA 120* 120*  < > 120* 120* 123* 123* 127*  K 4.3  --   --   --  4.1 3.9 3.8 3.8  CL 87*  --   --   --  86* 92* 91* 94*  CO2 26  --   --   --  28 25 26 28   GLUCOSE 126*  --   --   --  123* 116* 109* 114*  BUN 11  --   --   --  13 13 12 13   CREATININE 0.51 0.48  --   --  0.63 0.37* 0.51 0.71  CALCIUM 9.2  --   --   --  8.5* 8.4* 8.6* 8.6*  < > = values in this interval not displayed.   CBC:  Recent Labs Lab 08/20/15 2210 08/12/2015 1136 08/01/2015 1726 08/26/15 0412  08/26/15 1653 08/27/15 0439  WBC 8.5 8.3 6.1 5.7 5.5 5.1  NEUTROABS 6.9* 6.8*  --   --   --   --   HGB 11.8* 12.2 12.2 11.1* 11.4* 11.9*  HCT 36.2 35.6 37.3 33.1* 33.8* 35.7  MCV 93.5 92.2 93.3 93.4 92.5 94.3  PLT 308 302 300 296 301 305      Microbiology:  Recent Results (from the past 720 hour(s))  Urine culture     Status: None   Collection Time: 08/20/15 10:11 PM  Result Value Ref Range Status   Specimen Description URINE, RANDOM  Final   Special Requests NONE  Final   Culture >=100,000 COLONIES/mL ESCHERICHIA COLI  Final   Report Status 08/03/2015 FINAL  Final   Organism ID, Bacteria ESCHERICHIA COLI  Final      Susceptibility   Escherichia coli - MIC*    AMPICILLIN 4 SENSITIVE Sensitive     CEFTAZIDIME <=1 SENSITIVE Sensitive     CEFAZOLIN <=  4 SENSITIVE Sensitive     CEFTRIAXONE <=1 SENSITIVE Sensitive     CIPROFLOXACIN <=0.25 SENSITIVE Sensitive     GENTAMICIN <=1 SENSITIVE Sensitive     IMIPENEM <=0.25 SENSITIVE Sensitive     TRIMETH/SULFA <=20 SENSITIVE Sensitive     PIP/TAZO Value in next row Sensitive      SENSITIVE<=4    * >=100,000 COLONIES/mL ESCHERICHIA COLI  MRSA PCR Screening     Status: None   Collection Time: 08/26/15  4:00 PM  Result Value Ref Range Status   MRSA by PCR NEGATIVE NEGATIVE Final    Comment:        The GeneXpert MRSA Assay (FDA approved for NASAL specimens only), is one component of a comprehensive MRSA colonization surveillance program. It is not intended to diagnose MRSA infection nor to guide or monitor treatment for MRSA infections.     Coagulation Studies: No results for input(s): LABPROT, INR in the last 72 hours.  Urinalysis: No results for input(s): COLORURINE, LABSPEC, PHURINE, GLUCOSEU, HGBUR, BILIRUBINUR, KETONESUR, PROTEINUR, UROBILINOGEN, NITRITE, LEUKOCYTESUR in the last 72 hours.  Invalid input(s): APPERANCEUR    Imaging: Dg Chest 1 View  08/27/2015  CLINICAL DATA:  Shortness of Breath EXAM: CHEST 1 VIEW  COMPARISON:  Chest radiograph August 23, 2015 and chest CT August 24, 2015 FINDINGS: There is stable elevation of the right hemidiaphragm. There is patchy bibasilar atelectatic change. Lungs elsewhere clear. Heart is upper normal in size with pulmonary vascularity within normal limits. No adenopathy. No pneumothorax. There is degenerative change in each shoulder. IMPRESSION: Stable elevation of the right hemidiaphragm. Patchy bibasilar atelectasis. No frank edema or consolidation. No change in cardiac silhouette. Electronically Signed   By: Bretta Bang III M.D.   On: 08/27/2015 07:14   Ct Head Wo Contrast  08/26/2015  CLINICAL DATA:  Altered mental status.  CVA.  Weakness. EXAM: CT HEAD WITHOUT CONTRAST TECHNIQUE: Contiguous axial images were obtained from the base of the skull through the vertex without intravenous contrast. COMPARISON:  08/02/2014 FINDINGS: Sinuses/Soft tissues: Surgical changes of bilateral lens extraction. Clear paranasal sinuses and mastoid air cells. Intracranial: Mild to moderate low density in the periventricular white matter likely related to small vessel disease. No mass lesion, hemorrhage, hydrocephalus, acute infarct, intra-axial, or extra-axial fluid collection. IMPRESSION: 1.  No acute intracranial abnormality. 2. Small vessel ischemic change. Electronically Signed   By: Jeronimo Greaves M.D.   On: 08/26/2015 18:15     Medications:   . sodium chloride 100 mL/hr (08/27/15 0105)   . aspirin EC  81 mg Oral Daily  . benzonatate  100 mg Oral BID  . cholecalciferol  2,000 Units Oral Daily  . ciprofloxacin  250 mg Oral BID  . clindamycin (CLEOCIN) IV  600 mg Intravenous 3 times per day  . docusate sodium  100 mg Oral BID  . enoxaparin (LOVENOX) injection  40 mg Subcutaneous Q24H  . feeding supplement (ENSURE ENLIVE)  237 mL Oral BID BM  . ferrous sulfate  325 mg Oral Once per day on Mon Wed Fri   And  . vitamin C  250 mg Oral Once per day on Mon Wed Fri  . furosemide   40 mg Intravenous Once  . hydrALAZINE  50 mg Oral BID  . hydrocortisone  10 mg Oral BID  . irbesartan  37.5 mg Oral Daily  . levothyroxine  150 mcg Oral QAC breakfast  . lidocaine  1 patch Transdermal Daily  . magnesium oxide  400 mg Oral  Daily  . metoprolol  100 mg Oral BID  . montelukast  10 mg Oral QHS  . ondansetron  4 mg Oral TID  . pantoprazole  40 mg Oral BID  . polyethylene glycol  17 g Oral Daily  . pramipexole  0.25 mg Oral TID  . sodium chloride  1 g Oral BID  . sucralfate  1 g Oral TID AC  . tolvaptan  15 mg Oral Q24H   acetaminophen **OR** acetaminophen, HYDROcodone-acetaminophen, loperamide, magnesium hydroxide, ondansetron **OR** ondansetron (ZOFRAN) IV, ondansetron  Assessment/ Plan:  81 y.o. female With history of hypo-thyroidism, hypertension, degenerative joint disease, depression, TIAs, GERD, irritable bowel syndrome, restless leg syndrome, coronary disease, hyponatremia and mild renal insufficiency  1. Hyponatremia Chronic problem but her level was 133 back in August urine osmolality > S Osm suggesting SIADH  noncontrast CT shows no progression in pulm nodules Salt and Lasix did not have satisfactory improvement   Tolvaptan day #2 today UOP appears to have improved 2. HTN - hold hydralazine as BP is low. 3. Pulm edema - dc IV fluids    LOS: 4 Angelic Schnelle 11/4/20169:12 AM

## 2015-08-27 NOTE — Care Management Note (Signed)
Case Management Note  Patient Details  Name: Wyvonnia LoraFrances D Exline MRN: [id number] Date of Birth: 1929/06/19  Subjective/Objective:  Transferred to ICU with decreased mental status and hyponatremia. NA 161 today. Patient lethargic. BiPAP at HS.                   Action/Plan:   Expected Discharge Date:                  Expected Discharge Plan:     In-House Referral:     Discharge planning Services     Post Acute Care Choice:    Choice offered to:     DME Arranged:    DME Agency:     HH Arranged:    HH Agency:     Status of Service:     Medicare Important Message Given:  Yes-second notification given Date Medicare IM Given:    Medicare IM give by:    Date Additional Medicare IM Given:    Additional Medicare Important Message give by:     If discussed at Long Length of Stay Meetings, dates discussed:    Additional Comments:  Marily MemosLisa M Lopez Dentinger, RN 08/27/2015, 11:12 AM

## 2015-08-27 NOTE — Progress Notes (Signed)
eLink Physician-Brief Progress Note Patient Name: Wyvonnia LoraFrances D Bayliss DOB: 03/22/1929 MRN: [id number]   Date of Service  08/27/2015  HPI/Events of Note  ABG on 24%/PRVC 18/TV 500/P 5 = 7.53/25/102/  eICU Interventions  Will decrease PRVC rate to 12 and recheck ABG at 5 PM.     Intervention Category Major Interventions: Respiratory failure - evaluation and management  Sommer,Steven Eugene 08/27/2015, 3:42 PM

## 2015-08-27 NOTE — Progress Notes (Signed)
Patient ID: [name], female   DOB: 03/11/1929, 81 y.o.   MRN: [id number] [facility] [place] [facility]   PATIENT NAME: [name]    MR#:  [id number]  DATE OF BIRTH:  [birth year]  SUBJECTIVE:  Was very lethargic yesterday requiring transfer to CCU, got more acidotic and required intubation early afternoon on 11/4 REVIEW OF SYSTEMS:   Review of Systems  Unable to perform ROS: intubated   DRUG ALLERGIES:   Allergies  Allergen Reactions  . Strawberry Extract Anaphylaxis    Pt states her throat starts to close.   . Adhesive [Tape] Other (See Comments)    unknown  . Codeine Other (See Comments)    unknown  . Furosemide Other (See Comments)    unknown  . Macrodantin [Nitrofurantoin Macrocrystal] Nausea Only  . Penicillins Hives  . Sulfa Antibiotics Other (See Comments)    unknown  . Valium [Diazepam] Other (See Comments)    Altered mental status  . Ziac [Bisoprolol-Hydrochlorothiazide] Other (See Comments)    unknown    VITALS:  Blood pressure 94/45, pulse 51, temperature 98.2 F (36.8 C), temperature source Oral, resp. rate 16, height  (1.575 m), weight 78.6 kg (173 lb 4.5 oz), SpO2 100 %.  PHYSICAL EXAMINATION:   Physical Exam  Constitutional: She appears lethargic. She appears unhealthy. She appears toxic. She has a sickly appearance.  HENT:  Head: Normocephalic and atraumatic.  Intubated.  Endotracheal tube in proper position  Eyes: Conjunctivae and EOM are normal. Pupils are equal, round, and reactive to light.  Neck: Normal range of motion. Neck supple. No tracheal deviation present. No thyromegaly present.  Cardiovascular: Normal rate, regular rhythm and normal heart sounds.   Pulmonary/Chest: Effort normal and breath sounds normal. No respiratory distress. She has no wheezes. She exhibits no tenderness.  Abdominal: Soft. Bowel sounds are normal. She exhibits no distension. There is no tenderness.  Musculoskeletal:  Normal range of motion.  Neurological: She appears lethargic. No cranial nerve deficit.  Unable to evaluate due to her being on sedation for intubation  Skin: Skin is warm and dry. No rash noted.  Psychiatric: Mood and affect normal.  Sedated, intubated   LABORATORY PANEL:   CBC  Recent Labs Lab 08/27/15 0439  WBC 5.1  HGB 11.9*  HCT 35.7  PLT 305    Chemistries   Recent Labs Lab 08/26/15 1653 08/27/15 0439 08/27/15 1055  NA 123* 127* 128*  K 3.8 3.8  --   CL 91* 94*  --   CO2 26 28  --   GLUCOSE 109* 114*  --   BUN 12 13  --   CREATININE 0.51 0.71  --   CALCIUM 8.6* 8.6*  --   AST 47*  --   --   ALT 71*  --   --   ALKPHOS 65  --   --   BILITOT 0.5  --   --     Cardiac Enzymes  Recent Labs Lab 08/24/15 0513  TROPONINI <0.03    RADIOLOGY:  Dg Chest 1 View  08/27/2015  CLINICAL DATA:  Shortness of Breath EXAM: CHEST 1 VIEW COMPARISON:  Chest radiograph 08/24/15 and chest CT August 24, 2015 FINDINGS: There is stable elevation of the right hemidiaphragm. There is patchy bibasilar atelectatic change. Lungs elsewhere clear. Heart is upper normal in size with pulmonary vascularity within normal limits. No adenopathy. No pneumothorax. There is degenerative change in each shoulder. IMPRESSION: Stable elevation  of the right hemidiaphragm. Patchy bibasilar atelectasis. No frank edema or consolidation. No change in cardiac silhouette. Electronically Signed   By: Bretta BangWilliam  Woodruff III M.D.   On: 08/27/2015 07:14   Ct Head Wo Contrast  08/26/2015  CLINICAL DATA:  Altered mental status.  CVA.  Weakness. EXAM: CT HEAD WITHOUT CONTRAST TECHNIQUE: Contiguous axial images were obtained from the base of the skull through the vertex without intravenous contrast. COMPARISON:  08/02/2014 FINDINGS: Sinuses/Soft tissues: Surgical changes of bilateral lens extraction. Clear paranasal sinuses and mastoid air cells. Intracranial: Mild to moderate low density in the periventricular  white matter likely related to small vessel disease. No mass lesion, hemorrhage, hydrocephalus, acute infarct, intra-axial, or extra-axial fluid collection. IMPRESSION: 1.  No acute intracranial abnormality. 2. Small vessel ischemic change. Electronically Signed   By: Jeronimo GreavesKyle  Talbot M.D.   On: 08/26/2015 18:15   Dg Chest Port 1 View  08/27/2015  CLINICAL DATA:  Ventilatory support, respiratory failure, left IJ central line insertion EXAM: PORTABLE CHEST 1 VIEW COMPARISON:  08/27/2015 FINDINGS: Endotracheal tube 2.8 cm above the carina. NG tube extends below the hemidiaphragms into the stomach, tip not visualized. Left IJ central line tip SVC RA junction level. Persistent low lung volumes with vascular congestion and basilar atelectasis. Small effusions suspected. No pneumothorax. Stable heart size. IMPRESSION: Support apparatus as above Persistent low lung volumes with vascular congestion and basilar atelectasis. Electronically Signed   By: Judie PetitM.  Shick M.D.   On: 08/27/2015 14:00   Dg Abd Portable 1v  08/27/2015  CLINICAL DATA:  Encounter for central line placement. EXAM: PORTABLE ABDOMEN - 1 VIEW COMPARISON:  August 19, 2015. FINDINGS: The bowel gas pattern is normal. Nasogastric tube tip is seen in expected position of stomach. Status post surgical posterior fusion of lumbar spine. IMPRESSION: No evidence of bowel obstruction or ileus. Nasogastric tube tip seen in expected position of stomach. Electronically Signed   By: Lupita RaiderJames  Green Jr, M.D.   On: 08/27/2015 13:58   ASSESSMENT AND PLAN:   * Acute respiratory failure: Requiring intubation earlier today, intensivist following the patient while in the ICU. Left  IJ central line also placed on 11/4 by Dr Belia HemanKasa  * Acute metabolic encephalopathy: unsure etio. CT head neg, Appreciate neuro input.  1. Acute hyponatremia probably due to SIADH, d/w nephro. tolvaptan continued per nephrology.  Sodium trending in right direction. 123->127->128.  Will check  metabolic panel i am  2. Acute Sialadenitis continue IV clindamycin  -appreciate ENT eval  3. Recent diagnosis of Escherichia coli UTI sensitive to ciprofloxacin Continue by mouth ciprofloxacin for now. Can be stopped on 11/4.  4. History of hypothyroidism: TSH high, Appreciate Endocrine input. Continue  Synthroid at current home dose  5. Essential hypertension-continue Lopressor her home medication and titrate as needed basis  6. Pulmonary nodule-outpatient follow-up with primary care physician for further evaluation Repeat CT shows stable nodules. From July 2016 ?pulmoary eval  7.  Urinary retention: Foley in  Case discussed with Care Management/Social Worker and Dr Belia HemanKasa Management plans discussed with the patient, family and they are in agreement.  CODE STATUS: full  DVT Prophylaxis: lovenox  TOTAL TIME (Critical Care) TAKING CARE OF THIS PATIENT: 35 minutes.   >50% time spent on counselling and coordination of care (discussed with patient's son, Rosanne AshingJim at [phone], Gavin PoundDeborah in [place] at [phone]) - discussed with all family in family room in ICU.  She looks critically sick.   [facility], Kyliegh Jester M.D on 08/27/2015 at 4:34 PM  Between 7am to 6pm - Pager - [phone]  After 6pm go to [email or web address] - password [email or web address] [facility]  [facility]  Office  [phone]  CC: Primary care physician; Danella Penton., MD

## 2015-08-27 NOTE — Progress Notes (Signed)
PT Cancellation Note  Patient Details Name: [name] MRN: [id number] DOB: 07-16-1929   Cancelled Treatment:    Reason Eval/Treat Not Completed: Medical issues which prohibited therapy (Per chart review, patient with continued medical decline; now intubated for airway protection.  Will discontinue initial PT order; please reconsult as medically appropriate.)  Jennings Stirling H. Manson PasseyBrown, PT, DPT, NCS 08/27/2015, 1:41 PM [phone]

## 2015-08-27 NOTE — Progress Notes (Signed)
Initial Nutrition Assessment   INTERVENTION:   EN: recommend initiation of Vital HIgh Protein at rate of 20 ml/hr with goal rate of 45 ml/hr providing 1080 kcals, 95 g of protein, 907 mL of free water. Recommend minimal free water flushes at this time. Recommend checking magnesium and phosphorus in AM post TF intiation, noted BMP already ordered for AM. Continue to assess   NUTRITION DIAGNOSIS:   Inadequate oral intake related to acute illness as evidenced by NPO status, meal completion < 50%.  GOAL:   Provide needs based on ASPEN/SCCM guidelines  MONITOR:    (Energy Intake, Anthropometrics, Pulmonary, Digestive System, Electrolyte/Renal Profile)  REASON FOR ASSESSMENT:   LOS, NPO/Clear Liquid Diet, Ventilator, Consult Enteral/tube feeding initiation and management  ASSESSMENT:    Pt admitted with acute encephalopathy, acute hyponatremia, recent UTI, acute sialadenitis; pt transferred to ICU for monitoring; pt with worsening respiratory failure today requiring intubation  Past Medical History  Diagnosis Date  . Asthma   . Hypertension   . Arthritis   . Seizures [facility](HCC)    Past Surgical History  Procedure Laterality Date  . Joint replacement Left   . Cardiac catheterization    . Back surgery    . Kyphoplasty N/A 04/29/2015    Procedure: KYPHOPLASTY  T-12, T-10, T-7;  Surgeon: Kennedy BuckerMichael Menz, MD;  Location: [facility] ORS;  Service: Orthopedics;  Laterality: N/A;    Diet Order:  Diet NPO time specified   Energy Intake: recorded po intake very poor prior to intubation, 0-30% of meals since admission  Electrolyte and Renal Profile:  Recent Labs Lab 08/26/15 0412 08/26/15 1653 08/27/15 0439 08/27/15 1055  BUN 13 12 13   --   CREATININE 0.37* 0.51 0.71  --   NA 123* 123* 127* 128*  K 3.9 3.8 3.8  --    Glucose Profile:   Recent Labs  08/26/15 1627  GLUCAP 113*   Meds: lasix, mag ox,fentanyl  Food and Nutrition Related history:  Unable to complete Nutrition-Focused  physical exam at this time.   Height:   Ht Readings from Last 1 Encounters:  08/12/2015 5\' 2"  (1.575 m)    Weight:   Wt Readings from Last 1 Encounters:  08/27/15 173 lb 4.5 oz (78.6 kg)    Filed Weights   08/25/15 0445 08/26/15 0700 08/27/15 0425  Weight: 158 lb 6.4 oz (71.85 kg) 159 lb 3.2 oz (72.213 kg) 173 lb 4.5 oz (78.6 kg)    BMI:  Body mass index is 31.69 kg/(m^2).  Estimated Nutritional Needs:   Kcal:  [phone] kcals (11-14 kcals/kg) using wt of 78.6 kg  Protein:  100 g (2.0 g/kg) using IBW 50 kg  Fluid:  1250-1500 mL (25-20 ml/kg)     HIGH Care Level  Romelle Starcherate Khaleem Burchill MS, RD, LDN [phone] Pager

## 2015-08-28 ENCOUNTER — Inpatient Hospital Stay: Payer: Medicare Other

## 2015-08-28 DIAGNOSIS — R221 Localized swelling, mass and lump, neck: Secondary | ICD-10-CM

## 2015-08-28 DIAGNOSIS — R0602 Shortness of breath: Secondary | ICD-10-CM

## 2015-08-28 DIAGNOSIS — R220 Localized swelling, mass and lump, head: Secondary | ICD-10-CM

## 2015-08-28 DIAGNOSIS — R0600 Dyspnea, unspecified: Secondary | ICD-10-CM

## 2015-08-28 LAB — CBC
HCT: 28.2 % — ABNORMAL LOW (ref 35.0–47.0)
HEMOGLOBIN: 9.4 g/dL — AB (ref 12.0–16.0)
MCH: 31.3 pg (ref 26.0–34.0)
MCHC: 33.3 g/dL (ref 32.0–36.0)
MCV: 94 fL (ref 80.0–100.0)
Platelets: 236 10*3/uL (ref 150–440)
RBC: 3 MIL/uL — ABNORMAL LOW (ref 3.80–5.20)
RDW: 15.9 % — AB (ref 11.5–14.5)
WBC: 5.9 10*3/uL (ref 3.6–11.0)

## 2015-08-28 LAB — BASIC METABOLIC PANEL
Anion gap: 7 (ref 5–15)
BUN: 16 mg/dL (ref 6–20)
CALCIUM: 8.5 mg/dL — AB (ref 8.9–10.3)
CO2: 23 mmol/L (ref 22–32)
CREATININE: 0.82 mg/dL (ref 0.44–1.00)
Chloride: 100 mmol/L — ABNORMAL LOW (ref 101–111)
GFR calc non Af Amer: >60 mL/min (ref >60)
Glucose, Bld: 120 mg/dL — ABNORMAL HIGH (ref 65–99)
Potassium: 3.3 mmol/L — ABNORMAL LOW (ref 3.5–5.1)
SODIUM: 130 mmol/L — AB (ref 135–145)

## 2015-08-28 LAB — PHOSPHORUS: PHOSPHORUS: 2.3 mg/dL — AB (ref 2.5–4.6)

## 2015-08-28 LAB — SODIUM
Sodium: 130 mmol/L — ABNORMAL LOW (ref 135–145)
Sodium: 131 mmol/L — ABNORMAL LOW (ref 135–145)

## 2015-08-28 LAB — MAGNESIUM: MAGNESIUM: 1.8 mg/dL (ref 1.7–2.4)

## 2015-08-28 MED ORDER — POTASSIUM CHLORIDE 10 MEQ/100ML IV SOLN
10.0000 meq | INTRAVENOUS | Status: AC
  Administered 2015-08-28 – 2015-08-29 (×4): 10 meq via INTRAVENOUS
  Filled 2015-08-28 (×4): qty 100

## 2015-08-28 MED ORDER — MAGNESIUM SULFATE IN D5W 10-5 MG/ML-% IV SOLN
1.0000 g | Freq: Once | INTRAVENOUS | Status: AC
  Administered 2015-08-28: 1 g via INTRAVENOUS
  Filled 2015-08-28: qty 100

## 2015-08-28 MED ORDER — SODIUM CHLORIDE 1 G PO TABS
1.0000 g | ORAL_TABLET | Freq: Two times a day (BID) | ORAL | Status: DC
  Administered 2015-08-28 – 2015-09-01 (×9): 1 g
  Filled 2015-08-28 (×10): qty 1

## 2015-08-28 MED ORDER — ONDANSETRON HCL 4 MG PO TABS: 4.0000 mg | ORAL_TABLET | Freq: Three times a day (TID) | ORAL | Status: DC | PRN

## 2015-08-28 MED ORDER — SODIUM BICARBONATE 8.4 % IV SOLN: 50.0000 meq | Freq: Once | INTRAVENOUS | Status: DC

## 2015-08-28 MED ORDER — CALCIUM GLUCONATE 10 % IV SOLN
1.0000 g | Freq: Once | INTRAVENOUS | Status: AC
  Administered 2015-08-28: 1 g via INTRAVENOUS
  Filled 2015-08-28: qty 10

## 2015-08-28 MED ORDER — PRAMIPEXOLE DIHYDROCHLORIDE 0.25 MG PO TABS
0.2500 mg | ORAL_TABLET | Freq: Three times a day (TID) | ORAL | Status: DC
  Administered 2015-08-28 – 2015-09-01 (×13): 0.25 mg
  Filled 2015-08-28 (×13): qty 1

## 2015-08-28 MED ORDER — AMIODARONE HCL IN DEXTROSE 360-4.14 MG/200ML-% IV SOLN
30.0000 mg/h | INTRAVENOUS | Status: DC
  Administered 2015-08-29 – 2015-08-30 (×3): 30 mg/h via INTRAVENOUS
  Filled 2015-08-28 (×5): qty 200

## 2015-08-28 MED ORDER — HYDROCORTISONE 10 MG PO TABS
10.0000 mg | ORAL_TABLET | Freq: Two times a day (BID) | ORAL | Status: DC
  Administered 2015-08-28 – 2015-08-31 (×8): 10 mg
  Filled 2015-08-28 (×8): qty 1

## 2015-08-28 MED ORDER — AMIODARONE IV BOLUS ONLY 150 MG/100ML
150.0000 mg | Freq: Once | INTRAVENOUS | Status: AC
  Administered 2015-08-28: 150 mg via INTRAVENOUS
  Filled 2015-08-28: qty 100

## 2015-08-28 MED ORDER — AMIODARONE HCL IN DEXTROSE 360-4.14 MG/200ML-% IV SOLN
60.0000 mg/h | INTRAVENOUS | Status: AC
  Administered 2015-08-28: 60 mg/h via INTRAVENOUS
  Filled 2015-08-28: qty 200

## 2015-08-28 MED ORDER — PANTOPRAZOLE SODIUM 40 MG PO PACK
40.0000 mg | PACK | Freq: Every day | ORAL | Status: DC
  Administered 2015-08-28 – 2015-09-01 (×5): 40 mg
  Filled 2015-08-28 (×5): qty 20

## 2015-08-28 MED ORDER — POLYETHYLENE GLYCOL 3350 17 G PO PACK
17.0000 g | PACK | Freq: Every day | ORAL | Status: DC
  Administered 2015-08-30 – 2015-08-31 (×2): 17 g
  Filled 2015-08-28 (×2): qty 1

## 2015-08-28 MED ORDER — SODIUM BICARBONATE 8.4 % IV SOLN
INTRAVENOUS | Status: AC
  Filled 2015-08-28: qty 50

## 2015-08-28 MED ORDER — SODIUM BICARBONATE 8.4 % IV SOLN
50.0000 meq | Freq: Once | INTRAVENOUS | Status: AC
  Administered 2015-08-28: 50 meq via INTRAVENOUS

## 2015-08-28 NOTE — Progress Notes (Signed)
Nutrition Follow-up    INTERVENTION:  EN: continue vital high protein at goal rate of 28ml/hr at this time to meet nutritional needs   NUTRITION DIAGNOSIS:   Inadequate oral intake related to acute illness as evidenced by NPO status, meal completion < 50%, being addressed with tube feeding    GOAL:   Provide needs based on ASPEN/SCCM guidelines    MONITOR:    (Energy Intake, Anthropometrics, Pulmonary, Digestive System, Electrolyte/Renal Profile)  REASON FOR ASSESSMENT:   LOS, NPO/Clear Liquid Diet, Ventilator, Consult Enteral/tube feeding initiation and management  ASSESSMENT:      Pt remains on vent    Current Nutrition: tolerating vital high protein at 81ml/hr. Spoke with RN, Trula Ore   Gastrointestinal Profile: no signs of GI intolerance, abdomen soft, residuals Last BM: 11/3   Scheduled Medications:  . antiseptic oral rinse  7 mL Mouth Rinse QID  . aspirin  81 mg Per Tube Daily  . chlorhexidine gluconate  15 mL Mouth Rinse BID  . cholecalciferol  2,000 Units Oral Daily  . clindamycin (CLEOCIN) IV  600 mg Intravenous 3 times per day  . enoxaparin (LOVENOX) injection  40 mg Subcutaneous Q24H  . feeding supplement (VITAL HIGH PROTEIN)  1,000 mL Per Tube Q24H  . ferrous sulfate  325 mg Oral Once per day on Mon Wed Fri   And  . vitamin C  250 mg Oral Once per day on Mon Wed Fri  . free water  25 mL Per Tube 6 times per day  . furosemide  40 mg Intravenous Once  . hydrocortisone  10 mg Per Tube BID  . levothyroxine  75 mcg Intravenous Daily  . lidocaine  1 patch Transdermal Daily  . pantoprazole sodium  40 mg Per Tube Daily  . polyethylene glycol  17 g Per Tube Daily  . pramipexole  0.25 mg Per Tube TID  . senna-docusate  1 tablet Per Tube BID  . sodium chloride  1 g Per Tube BID  . tolvaptan  15 mg Oral Q24H    Continuous Medications:  . fentaNYL infusion INTRAVENOUS 125 mcg/hr (08/28/15 0822)  . norepinephrine 3 mcg/min (08/27/15 1900)      Electrolyte/Renal Profile and Glucose Profile:   Recent Labs Lab 08/26/15 1653 08/27/15 0439  08/27/15 1646 08/27/15 2304 08/28/15 0420  NA 123* 127*  < > 129* 128* 130*  K 3.8 3.8  --   --   --  3.3*  CL 91* 94*  --   --   --  100*  CO2 26 28  --   --   --  23  BUN 12 13  --   --   --  16  CREATININE 0.51 0.71  --   --   --  0.82  CALCIUM 8.6* 8.6*  --   --   --  8.5*  MG  --   --   --   --   --  1.8  PHOS  --   --   --   --   --  2.3*  GLUCOSE 109* 114*  --   --   --  120*  < > = values in this interval not displayed. Protein Profile:   Recent Labs Lab 09/12/15 1136 08/26/15 1653  ALBUMIN 3.5 3.3*      Weight Trend since Admission: Filed Weights   08/26/15 0700 08/27/15 0425 08/28/15 0635  Weight: 159 lb 3.2 oz (72.213 kg) 173 lb 4.5 oz (78.6 kg) 170  lb 10.2 oz (77.4 kg)      Diet Order:  Diet NPO time specified  Skin:   reviewed   Height:   Ht Readings from Last 1 Encounters:  Feb 20, 2015 5\' 2"  (1.575 m)    Weight:   Wt Readings from Last 1 Encounters:  08/28/15 170 lb 10.2 oz (77.4 kg)         BMI:  Body mass index is 31.2 kg/(m^2).  Estimated Nutritional Needs:   Kcal:  [phone] kcals (11-14 kcals/kg) using wt of 78.6 kg  Protein:  100 g (2.0 g/kg) using IBW 50 kg  Fluid:  1250-1500 mL (25-20 ml/kg)   EDUCATION NEEDS:   No education needs identified at this time  HIGH Care Level  Berneice Zettlemoyer B. Freida BusmanAllen, RD, LDN [phone] (pager)

## 2015-08-28 NOTE — Progress Notes (Signed)
Subjective:   Intubated now Low dose pressors Na improved to 130   Objective:  Vital signs in last 24 hours:  Temp:  [98.2 F (36.8 C)-99.8 F (37.7 C)] 99.1 F (37.3 C) (11/05 0700) Pulse Rate:  [45-72] 45 (11/05 0800) Resp:  [12-24] 14 (11/05 0800) BP: (60-208)/(37-99) 114/46 mmHg (11/05 0800) SpO2:  [93 %-100 %] 97 % (11/05 0800) FiO2 (%):  [24 %] 24 % (11/05 0700) Weight:  [77.4 kg (170 lb 10.2 oz)] 77.4 kg (170 lb 10.2 oz) (11/05 0635)  Weight change: -1.2 kg (-2 lb 10.3 oz) Filed Weights   08/26/15 0700 08/27/15 0425 08/28/15 0635  Weight: 72.213 kg (159 lb 3.2 oz) 78.6 kg (173 lb 4.5 oz) 77.4 kg (170 lb 10.2 oz)    Intake/Output:    Intake/Output Summary (Last 24 hours) at 08/28/15 0843 Last data filed at 08/28/15 [id number]  Gross per 24 hour  Intake   1319 ml  Output    975 ml  Net    344 ml     Physical Exam: General:  cushingoid appearance   HEENT  ETT  Neck  supple   Pulm/lungs Clear ant/lat , vent assisted  CVS/Heart  irregular rhythm, no rub, soft systolic murmur   Abdomen:   obese, soft, nontender, nondistended   Extremities:  no peripheral edema   Neurologic:  lethargic               Basic Metabolic Panel:   Recent Labs Lab 08/25/15 1116 08/26/15 0412 08/26/15 1653 08/27/15 0439 08/27/15 1055 08/27/15 1646 08/27/15 2304 08/28/15 0420  NA 120* 123* 123* 127* 128* 129* 128* 130*  K 4.1 3.9 3.8 3.8  --   --   --  3.3*  CL 86* 92* 91* 94*  --   --   --  100*  CO2 28 25 26 28   --   --   --  23  GLUCOSE 123* 116* 109* 114*  --   --   --  120*  BUN 13 13 12 13   --   --   --  16  CREATININE 0.63 0.37* 0.51 0.71  --   --   --  0.82  CALCIUM 8.5* 8.4* 8.6* 8.6*  --   --   --  8.5*  MG  --   --   --   --   --   --   --  1.8  PHOS  --   --   --   --   --   --   --  2.3*     CBC:  Recent Labs Lab 04/04/15 1136 04/04/15 1726 08/26/15 0412 08/26/15 1653 08/27/15 0439 08/28/15 0420  WBC 8.3 6.1 5.7 5.5 5.1 5.9  NEUTROABS 6.8*  --    --   --   --   --   HGB 12.2 12.2 11.1* 11.4* 11.9* 9.4*  HCT 35.6 37.3 33.1* 33.8* 35.7 28.2*  MCV 92.2 93.3 93.4 92.5 94.3 94.0  PLT 302 300 296 301 305 236      Microbiology:  Recent Results (from the past 720 hour(s))  Urine culture     Status: None   Collection Time: 08/20/15 10:11 PM  Result Value Ref Range Status   Specimen Description URINE, RANDOM  Final   Special Requests NONE  Final   Culture >=100,000 COLONIES/mL ESCHERICHIA COLI  Final   Report Status July 01, 2015 FINAL  Final   Organism ID, Bacteria ESCHERICHIA COLI  Final  Susceptibility   Escherichia coli - MIC*    AMPICILLIN 4 SENSITIVE Sensitive     CEFTAZIDIME <=1 SENSITIVE Sensitive     CEFAZOLIN <=4 SENSITIVE Sensitive     CEFTRIAXONE <=1 SENSITIVE Sensitive     CIPROFLOXACIN <=0.25 SENSITIVE Sensitive     GENTAMICIN <=1 SENSITIVE Sensitive     IMIPENEM <=0.25 SENSITIVE Sensitive     TRIMETH/SULFA <=20 SENSITIVE Sensitive     PIP/TAZO Value in next row Sensitive      SENSITIVE<=4    * >=100,000 COLONIES/mL ESCHERICHIA COLI  MRSA PCR Screening     Status: None   Collection Time: 08/26/15  4:00 PM  Result Value Ref Range Status   MRSA by PCR NEGATIVE NEGATIVE Final    Comment:        The GeneXpert MRSA Assay (FDA approved for NASAL specimens only), is one component of a comprehensive MRSA colonization surveillance program. It is not intended to diagnose MRSA infection nor to guide or monitor treatment for MRSA infections.     Coagulation Studies: No results for input(s): LABPROT, INR in the last 72 hours.  Urinalysis: No results for input(s): COLORURINE, LABSPEC, PHURINE, GLUCOSEU, HGBUR, BILIRUBINUR, KETONESUR, PROTEINUR, UROBILINOGEN, NITRITE, LEUKOCYTESUR in the last 72 hours.  Invalid input(s): APPERANCEUR    Imaging: Dg Chest 1 View  08/27/2015  CLINICAL DATA:  Shortness of Breath EXAM: CHEST 1 VIEW COMPARISON:  Chest radiograph 09/06/15 and chest CT August 24, 2015  FINDINGS: There is stable elevation of the right hemidiaphragm. There is patchy bibasilar atelectatic change. Lungs elsewhere clear. Heart is upper normal in size with pulmonary vascularity within normal limits. No adenopathy. No pneumothorax. There is degenerative change in each shoulder. IMPRESSION: Stable elevation of the right hemidiaphragm. Patchy bibasilar atelectasis. No frank edema or consolidation. No change in cardiac silhouette. Electronically Signed   By: Bretta Bang III M.D.   On: 08/27/2015 07:14   Ct Head Wo Contrast  08/26/2015  CLINICAL DATA:  Altered mental status.  CVA.  Weakness. EXAM: CT HEAD WITHOUT CONTRAST TECHNIQUE: Contiguous axial images were obtained from the base of the skull through the vertex without intravenous contrast. COMPARISON:  08/02/2014 FINDINGS: Sinuses/Soft tissues: Surgical changes of bilateral lens extraction. Clear paranasal sinuses and mastoid air cells. Intracranial: Mild to moderate low density in the periventricular white matter likely related to small vessel disease. No mass lesion, hemorrhage, hydrocephalus, acute infarct, intra-axial, or extra-axial fluid collection. IMPRESSION: 1.  No acute intracranial abnormality. 2. Small vessel ischemic change. Electronically Signed   By: Jeronimo Greaves M.D.   On: 08/26/2015 18:15   Dg Chest Port 1 View  08/27/2015  CLINICAL DATA:  Ventilatory support, respiratory failure, left IJ central line insertion EXAM: PORTABLE CHEST 1 VIEW COMPARISON:  08/27/2015 FINDINGS: Endotracheal tube 2.8 cm above the carina. NG tube extends below the hemidiaphragms into the stomach, tip not visualized. Left IJ central line tip SVC RA junction level. Persistent low lung volumes with vascular congestion and basilar atelectasis. Small effusions suspected. No pneumothorax. Stable heart size. IMPRESSION: Support apparatus as above Persistent low lung volumes with vascular congestion and basilar atelectasis. Electronically Signed   By: Judie Petit.   Shick M.D.   On: 08/27/2015 14:00   Dg Abd Portable 1v  08/27/2015  CLINICAL DATA:  Encounter for central line placement. EXAM: PORTABLE ABDOMEN - 1 VIEW COMPARISON:  August 19, 2015. FINDINGS: The bowel gas pattern is normal. Nasogastric tube tip is seen in expected position of stomach. Status post surgical posterior  fusion of lumbar spine. IMPRESSION: No evidence of bowel obstruction or ileus. Nasogastric tube tip seen in expected position of stomach. Electronically Signed   By: Lupita Raider, M.D.   On: 08/27/2015 13:58     Medications:   . fentaNYL infusion INTRAVENOUS 125 mcg/hr (08/28/15 0822)  . norepinephrine 3 mcg/min (08/27/15 1900)   . antiseptic oral rinse  7 mL Mouth Rinse QID  . aspirin  81 mg Per Tube Daily  . chlorhexidine gluconate  15 mL Mouth Rinse BID  . cholecalciferol  2,000 Units Oral Daily  . clindamycin (CLEOCIN) IV  600 mg Intravenous 3 times per day  . enoxaparin (LOVENOX) injection  40 mg Subcutaneous Q24H  . feeding supplement (ENSURE ENLIVE)  237 mL Oral BID BM  . feeding supplement (VITAL HIGH PROTEIN)  1,000 mL Per Tube Q24H  . ferrous sulfate  325 mg Oral Once per day on Mon Wed Fri   And  . vitamin C  250 mg Oral Once per day on Mon Wed Fri  . free water  25 mL Per Tube 6 times per day  . furosemide  40 mg Intravenous Once  . hydrocortisone  10 mg Oral BID  . levothyroxine  75 mcg Intravenous Daily  . lidocaine  1 patch Transdermal Daily  . pantoprazole  40 mg Oral BID  . polyethylene glycol  17 g Oral Daily  . pramipexole  0.25 mg Oral TID  . senna-docusate  1 tablet Per Tube BID  . sodium chloride  1 g Oral BID  . tolvaptan  15 mg Oral Q24H   acetaminophen **OR** acetaminophen, HYDROcodone-acetaminophen, loperamide, magnesium hydroxide, midazolam, ondansetron **OR** ondansetron (ZOFRAN) IV, ondansetron  Assessment/ Plan:  81 y.o. female With history of hypo-thyroidism, hypertension, degenerative joint disease, depression, TIAs, GERD,  irritable bowel syndrome, restless leg syndrome, coronary disease, hyponatremia and mild renal insufficiency  1. Hyponatremia  Chronic problem but her level was 133 back in August  urine osmolality > S Osm suggesting SIADH  noncontrast CT shows no progression in pulm nodules  Salt and Lasix did not have satisfactory improvement   Tolvaptan day # 3 today. May d/c after today's dose    2. HTN - hold hydralazine as BP is low.  3. Pulm edema - dc IV fluids - vent assisted now.    LOS: 5 Toyia Jelinek 11/5/20168:43 AM

## 2015-08-28 NOTE — Progress Notes (Signed)
[facility] - [place] at [facility]   PATIENT NAME: [name]    MR#:  [id number]  DATE OF BIRTH:  01/15/1929  SUBJECTIVE:  CHIEF COMPLAINT:   Chief Complaint  Patient presents with  . Abdominal Pain    x 2 weeks   -Patient admitted for hyponatremia, became very lethargic requiring transfer to ICU. Intubated for hypoxia and acidosis -Remains on vent, day 2 today. -2 g of Levothroid for hypotension and fentanyl for sedation. Able to open eyes and follows some simple commands  REVIEW OF SYSTEMS:  Review of Systems  Unable to perform ROS: intubated    DRUG ALLERGIES:   Allergies  Allergen Reactions  . Strawberry Extract Anaphylaxis    Pt states her throat starts to close.   . Adhesive [Tape] Other (See Comments)    unknown  . Codeine Other (See Comments)    unknown  . Furosemide Other (See Comments)    unknown  . Macrodantin [Nitrofurantoin Macrocrystal] Nausea Only  . Penicillins Hives  . Sulfa Antibiotics Other (See Comments)    unknown  . Valium [Diazepam] Other (See Comments)    Altered mental status  . Ziac [Bisoprolol-Hydrochlorothiazide] Other (See Comments)    unknown    VITALS:  Blood pressure 131/45, pulse 68, temperature 99.1 F (37.3 C), temperature source Oral, resp. rate 12, height  (1.575 m), weight 77.4 kg (170 lb 10.2 oz), SpO2 97 %.  PHYSICAL EXAMINATION:  Physical Exam  Constitutional: Chronically ill-appearing patient, intubated and sedated HENT:  Head: Normocephalic and atraumatic.  Intubated. Endotracheal tube in proper position  Eyes: Conjunctivae and EOM are normal. Pupils are equal, round, and reactive to light.  Neck: Normal range of motion. Neck supple. No tracheal deviation present. No thyromegaly present.  Cardiovascular: Normal rate, regular rhythm and normal heart sounds.  Pulmonary/Chest: Effort normal and breath sounds normal. No respiratory distress. She has no wheezes. She exhibits no  tenderness. Decreased bibasilar breath sounds. No rhonchi or crepitations. Abdominal: Soft. Bowel sounds are normal. She exhibits no distension. There is no tenderness.  Musculoskeletal: Normal range of motion.  Neurological: Patient is sedated. Following some simple commands. Able to move all extremities in bed. No cranial nerve deficit.  Unable to evaluate due to her being on sedation for intubation  Skin: Skin is warm and dry. No rash noted.  Psychiatric: Mood and affect normal.  Sedated, intubated    LABORATORY PANEL:   CBC  Recent Labs Lab 08/28/15 0420  WBC 5.9  HGB 9.4*  HCT 28.2*  PLT 236   ------------------------------------------------------------------------------------------------------------------  Chemistries   Recent Labs Lab 08/26/15 1653  08/28/15 0420 08/28/15 1151  NA 123*  < > 130* 130*  K 3.8  < > 3.3*  --   CL 91*  < > 100*  --   CO2 26  < > 23  --   GLUCOSE 109*  < > 120*  --   BUN 12  < > 16  --   CREATININE 0.51  < > 0.82  --   CALCIUM 8.6*  < > 8.5*  --   MG  --   --  1.8  --   AST 47*  --   --   --   ALT 71*  --   --   --   ALKPHOS 65  --   --   --   BILITOT 0.5  --   --   --   < > = values in  this interval not displayed. ------------------------------------------------------------------------------------------------------------------  Cardiac Enzymes  Recent Labs Lab 08/24/15 0513  TROPONINI <0.03   ------------------------------------------------------------------------------------------------------------------  RADIOLOGY:  Dg Chest 1 View  08/27/2015  CLINICAL DATA:  Shortness of Breath EXAM: CHEST 1 VIEW COMPARISON:  Chest radiograph 2015/09/03 and chest CT August 24, 2015 FINDINGS: There is stable elevation of the right hemidiaphragm. There is patchy bibasilar atelectatic change. Lungs elsewhere clear. Heart is upper normal in size with pulmonary vascularity within normal limits. No adenopathy. No pneumothorax. There is  degenerative change in each shoulder. IMPRESSION: Stable elevation of the right hemidiaphragm. Patchy bibasilar atelectasis. No frank edema or consolidation. No change in cardiac silhouette. Electronically Signed   By: Bretta Bang III M.D.   On: 08/27/2015 07:14   Ct Head Wo Contrast  08/26/2015  CLINICAL DATA:  Altered mental status.  CVA.  Weakness. EXAM: CT HEAD WITHOUT CONTRAST TECHNIQUE: Contiguous axial images were obtained from the base of the skull through the vertex without intravenous contrast. COMPARISON:  08/02/2014 FINDINGS: Sinuses/Soft tissues: Surgical changes of bilateral lens extraction. Clear paranasal sinuses and mastoid air cells. Intracranial: Mild to moderate low density in the periventricular white matter likely related to small vessel disease. No mass lesion, hemorrhage, hydrocephalus, acute infarct, intra-axial, or extra-axial fluid collection. IMPRESSION: 1.  No acute intracranial abnormality. 2. Small vessel ischemic change. Electronically Signed   By: Jeronimo Greaves M.D.   On: 08/26/2015 18:15   Dg Chest Port 1 View  08/28/2015  CLINICAL DATA:  81 year old female patient with shortness of breath. EXAM: PORTABLE CHEST 1 VIEW COMPARISON:  Chest x-ray 08/27/2015. FINDINGS: Endotracheal tube is in position with tip immediately above the level of the carina and directed toward the orifice of the right mainstem bronchus. A nasogastric tube is seen extending into the stomach, however, the tip of the nasogastric tube extends below the lower margin of the image. There is a left-sided internal jugular central venous catheter with tip terminating in the superior cavoatrial junction. Lung volumes are low and there are bibasilar opacities which may reflect areas of atelectasis and/or consolidation. Mild blunting of the costophrenic sulci bilaterally may suggest small bilateral pleural effusions. No evidence of pulmonary edema. Heart size appears borderline enlarged, accentuated by low lung  volumes, portable technique and patient's rotation to the left, which also grossly distorts mediastinal contours. Atherosclerosis in the thoracic aorta. IMPRESSION: 1. Support apparatus, as above. 2. Low lung volumes with bibasilar areas of atelectasis and/or consolidation and probable small bilateral pleural effusions. 3. Atherosclerosis. Electronically Signed   By: Trudie Reed M.D.   On: 08/28/2015 09:34   Dg Chest Port 1 View  08/27/2015  CLINICAL DATA:  Ventilatory support, respiratory failure, left IJ central line insertion EXAM: PORTABLE CHEST 1 VIEW COMPARISON:  08/27/2015 FINDINGS: Endotracheal tube 2.8 cm above the carina. NG tube extends below the hemidiaphragms into the stomach, tip not visualized. Left IJ central line tip SVC RA junction level. Persistent low lung volumes with vascular congestion and basilar atelectasis. Small effusions suspected. No pneumothorax. Stable heart size. IMPRESSION: Support apparatus as above Persistent low lung volumes with vascular congestion and basilar atelectasis. Electronically Signed   By: Judie Petit.  Shick M.D.   On: 08/27/2015 14:00   Dg Abd Portable 1v  08/27/2015  CLINICAL DATA:  Encounter for central line placement. EXAM: PORTABLE ABDOMEN - 1 VIEW COMPARISON:  August 19, 2015. FINDINGS: The bowel gas pattern is normal. Nasogastric tube tip is seen in expected position of stomach. Status post surgical  posterior fusion of lumbar spine. IMPRESSION: No evidence of bowel obstruction or ileus. Nasogastric tube tip seen in expected position of stomach. Electronically Signed   By: Lupita RaiderJames  Green Jr, M.D.   On: 08/27/2015 13:58    EKG:   Orders placed or performed during the hospital encounter of 09/18/15  . ED EKG  . ED EKG  . EKG 12-Lead  . EKG 12-Lead    ASSESSMENT AND PLAN:   81 year old female with past medical history significant for hypertension, diastolic CHF, SIADH, Escherichia coli UTI as outpatient presented to the hospital secondary to worsening  weakness and confusion. Noted to have hyponatremia on presentation.  #1 acute on chronic hyponatremia-known history of SIADH. Sodium on admission was 120. -Appreciate nephrology consult. Started on tolvaptan -Sodium has improved up to 130 -And tinnitus monitor at this time.  #2 acute respiratory failure-intubated for respiratory acidosis. -Remains on ventilator, minimal vent settings at this time. FiO2 of 24%. -Management per pulmonary team. -Likely spontaneous breathing trial in the next day -Echocardiogram gram with normal ejection fraction of 60-65%. Likely has diastolic dysfunction. But elevated pulmonary artery pressures  #3 acute sialoadenitis and recent Escherichia coli UTI. Continue clindamycin. Appreciate ENT evaluation. -Finished ciprofloxacin for UTI  #4 Pulmonary nodules-noted on CT chest, has remained stable from past evaluations. -No further imaging/intervention needed  #5 gastroesophageal reflux disease-also has a large hiatal hernia on chest x-ray. -Continue PPI  #6 history of adrenal insufficiency-continue oral hydrocortisone. -Per intensivist, no indication for stress dose steroids at this time.  #7 DVT prophylaxis-on Lovenox  Dr. Belia HemanKasa has discussed with  the family members this morning. According to his discussion details-patient is changed to DO NOT RESUSCITATE at this time   All the records are reviewed and case discussed with Care Management/Social Workerr. Management plans discussed with the patient, family and they are in agreement.  CODE STATUS: DO NOT RESUSCITATE  TOTAL CRITICAL CARE TIME SPENT IN TAKING CARE OF THIS PATIENT: 38 minutes.   POSSIBLE D/C IN 3-4 DAYS, DEPENDING ON CLINICAL CONDITION.   Enid BaasKALISETTI,Imoni Kohen M.D on 08/28/2015 at 1:36 PM  Between 7am to 6pm - Pager - [phone]  After 6pm go to [email or web address] - password [email or web address] [facility]  [facility]  Office  [phone]  CC: Primary care physician; Danella PentonMILLER,MARK F., MD

## 2015-08-28 NOTE — Consult Note (Signed)
After further discussion with family, she has advanced directives and she is NOW a DNR.

## 2015-08-28 NOTE — Progress Notes (Signed)
Tube Feeding residual checked: 170ml. Residual returned. [name] E 7:49 AM 08/28/2015

## 2015-08-28 NOTE — Consult Note (Signed)
[facility] [place] Critical Care Medicine Consultation     ASSESSMENT/PLAN   The patient is an 81 year old Caucasian female with history significant for adrenal insufficiency, SIADH, as well as obstructive sleep apnea, morbid obesity, chronic hypoventilation. Currently, she has had reduced mental status and resp failure in setting of submandibular cellulitis with low sodium levels, she was transferred to the ICU for closer monitoring. Likely related to sepsis/UTI with Subsequen respiratory failure and encephalopathy.   PULMONARY  A: Acute hypoxic respiratory failure. Intubated,sedated -Acute on chronic hypercapnic respiratory failure, with chronic hypoventilation. -element of obesity hypoventilation syndrome, with obstructive sleep apnea. -Elevated/paralyzed right diaphragm with likely restrictive lung disease. This may be due to morbid obesity and/or enlarged liver. -Lung nodules, which appear to have been stable for at least the last 6 months. -CT finding suggestive of congestive heart failure and pulmonary hypertension. -Hiatal hernia which may be causing some degree of reflux and aspiration. -Restless leg syndrome.  P:   -continue full vent support, follow up ABg and CXR as needed -Pulmonary hypertension, likely secondary to mild LV dysfunction, as well as OSA, and obesity. -will attempt SAT/SBT in next 24-48 hrs   CARDIOVASCULAR  A: LV diastolic dysfunction. -Atrial fibrillation. P:  Continue blood pressure control.  RENAL A: SIADH, currently on tolvaptan, nephrology following. -Adrenal insufficiency, currently on steroid replacement. P:   Continue tolvaptan per nephrology.  GASTROINTESTINAL A:  GERD, hiatal hernia. -History of colitis, history of pancreatitis. P:   Continue PPI   INFECTIOUS A: UTI with Escherichia coli/Sialodinitis. P:  Continue clindaymycin   ENDOCRINE A: History of adrenal insufficiency, currently on steroid replacement with cushingoid  features. P:   Continue steroids, no need for stress dose at this point.  NEUROLOGIC --Altered mental status, likely due to metabolic encephalopathy, and multifactorial from above conditions. We'll continue to monitor.  CT chest from 08/24/15, comparison with 03/24/15; unchanged pulmonary nodules. Moderate to large hiatal hernia. Morbid obesity with bibasilar atelectasis. Enlarged liver with elevated/paralyzed right diaphragm.   Echocardiogram from 08/24/2015: Ejection fraction of 60 to 65%. Pulmonary artery systolic pressure elevated at 51 mmHg. ---------------------------------------  ---------------------------------------   Name: [name] MRN: [id number] DOB: 1929/07/30    ADMISSION DATE:  09/18/15 CONSULTATION DATE:  08/26/2015  REFERRING MD :  Dr. Sherryll Burger  CHIEF COMPLAINT:  Altered mental status.   HISTORY OF PRESENT ILLNESS:   Patient with increased WOB and increased somnelence yesterday, failed biPAP therapy Patient was intubated,sedated  REVIEW OF SYSTEMS:   Review of Systems  Unable to perform ROS: critical illness      VITAL SIGNS: Temp:  [98.2 F (36.8 C)-99.8 F (37.7 C)] 99.1 F (37.3 C) (11/05 0700) Pulse Rate:  [45-72] 45 (11/05 0800) Resp:  [12-24] 14 (11/05 0800) BP: (60-208)/(37-99) 114/46 mmHg (11/05 0800) SpO2:  [93 %-100 %] 97 % (11/05 0800) FiO2 (%):  [24 %] 24 % (11/05 0700) Weight:  [170 lb 10.2 oz (77.4 kg)] 170 lb 10.2 oz (77.4 kg) (11/05 0635) HEMODYNAMICS:   VENTILATOR SETTINGS: Vent Mode:  [-] PRVC FiO2 (%):  [24 %] 24 % Set Rate:  [12 bmp-18 bmp] 12 bmp Vt Set:  [500 mL] 500 mL PEEP:  [5 cmH20] 5 cmH20 Plateau Pressure:  [18 cmH20] 18 cmH20 INTAKE / OUTPUT:  Intake/Output Summary (Last 24 hours) at 08/28/15 0843 Last data filed at 08/28/15 4098  Gross per 24 hour  Intake   1319 ml  Output    975 ml  Net    344 ml  Physical Examination:   VS: BP 114/46 mmHg  Pulse 45  Temp(Src) 99.1 F (37.3 C) (Oral)  Resp 14   Ht 5\' 2"  (1.575 m)  Wt 170 lb 10.2 oz (77.4 kg)  BMI 31.20 kg/m2  SpO2 97%  General Appearance: sedated,intubated GCS8T  HEENT: PERRLA, EOM intact, no ptosis, no other lesions noticed;  Pulmonary: rhonchi CardiovascularNormal S1,S2.  No m/r/g.    Abdomen: Benign, Soft, non-tender, No masses, Renal:  No costovertebral tenderness  GU:  Not performed at this time. Endoc: No evident thyromegaly Skin:   warm, no rashes, no ecchymosis  Extremities: normal, no cyanosis, clubbing, no edema, warm with normal capillary refill.    LABS: Reviewed   LABORATORY PANEL:   CBC  Recent Labs Lab 08/28/15 0420  WBC 5.9  HGB 9.4*  HCT 28.2*  PLT 236    Chemistries   Recent Labs Lab 08/26/15 1653  08/28/15 0420  NA 123*  < > 130*  K 3.8  < > 3.3*  CL 91*  < > 100*  CO2 26  < > 23  GLUCOSE 109*  < > 120*  BUN 12  < > 16  CREATININE 0.51  < > 0.82  CALCIUM 8.6*  < > 8.5*  MG  --   --  1.8  PHOS  --   --  2.3*  AST 47*  --   --   ALT 71*  --   --   ALKPHOS 65  --   --   BILITOT 0.5  --   --   < > = values in this interval not displayed.   Recent Labs Lab 08/26/15 1627  GLUCAP 113*    Recent Labs Lab 08/27/15 1215 08/27/15 1450 08/27/15 1700  PHART 7.29* 7.53* 7.33*  PCO2ART 53* 25* 45  PO2ART 107 102 85    Recent Labs Lab 2015-02-22 1136 08/26/15 1653  AST 27 47*  ALT 49 71*  ALKPHOS 59 65  BILITOT 0.4 0.5  ALBUMIN 3.5 3.3*    Cardiac Enzymes  Recent Labs Lab 08/24/15 0513  TROPONINI <0.03    RADIOLOGY:  Dg Chest 1 View  08/27/2015  CLINICAL DATA:  Shortness of Breath EXAM: CHEST 1 VIEW COMPARISON:  Chest radiograph August 23, 2015 and chest CT August 24, 2015 FINDINGS: There is stable elevation of the right hemidiaphragm. There is patchy bibasilar atelectatic change. Lungs elsewhere clear. Heart is upper normal in size with pulmonary vascularity within normal limits. No adenopathy. No pneumothorax. There is degenerative change in each shoulder.  IMPRESSION: Stable elevation of the right hemidiaphragm. Patchy bibasilar atelectasis. No frank edema or consolidation. No change in cardiac silhouette. Electronically Signed   By: Bretta BangWilliam  Woodruff III M.D.   On: 08/27/2015 07:14   Ct Head Wo Contrast  08/26/2015  CLINICAL DATA:  Altered mental status.  CVA.  Weakness. EXAM: CT HEAD WITHOUT CONTRAST TECHNIQUE: Contiguous axial images were obtained from the base of the skull through the vertex without intravenous contrast. COMPARISON:  08/02/2014 FINDINGS: Sinuses/Soft tissues: Surgical changes of bilateral lens extraction. Clear paranasal sinuses and mastoid air cells. Intracranial: Mild to moderate low density in the periventricular white matter likely related to small vessel disease. No mass lesion, hemorrhage, hydrocephalus, acute infarct, intra-axial, or extra-axial fluid collection. IMPRESSION: 1.  No acute intracranial abnormality. 2. Small vessel ischemic change. Electronically Signed   By: Jeronimo GreavesKyle  Talbot M.D.   On: 08/26/2015 18:15   Dg Chest Port 1 View  08/27/2015  CLINICAL DATA:  Ventilatory support, respiratory failure, left IJ central line insertion EXAM: PORTABLE CHEST 1 VIEW COMPARISON:  08/27/2015 FINDINGS: Endotracheal tube 2.8 cm above the carina. NG tube extends below the hemidiaphragms into the stomach, tip not visualized. Left IJ central line tip SVC RA junction level. Persistent low lung volumes with vascular congestion and basilar atelectasis. Small effusions suspected. No pneumothorax. Stable heart size. IMPRESSION: Support apparatus as above Persistent low lung volumes with vascular congestion and basilar atelectasis. Electronically Signed   By: Judie Petit.  Shick M.D.   On: 08/27/2015 14:00   Dg Abd Portable 1v  08/27/2015  CLINICAL DATA:  Encounter for central line placement. EXAM: PORTABLE ABDOMEN - 1 VIEW COMPARISON:  August 19, 2015. FINDINGS: The bowel gas pattern is normal. Nasogastric tube tip is seen in expected position of stomach.  Status post surgical posterior fusion of lumbar spine. IMPRESSION: No evidence of bowel obstruction or ileus. Nasogastric tube tip seen in expected position of stomach. Electronically Signed   By: Lupita Raider, M.D.   On: 08/27/2015 13:58     I have personally obtained a history, examined the patient, evaluated Pertinent laboratory and RadioGraphic/imaging results, and  formulated the assessment and plan   The Patient requires high complexity decision making for assessment and support, frequent evaluation and titration of therapies, application of advanced monitoring technologies and extensive interpretation of multiple databases. Critical Care Time devoted to patient care services described in this note is 40 minutes.   Overall, patient is critically ill, prognosis is guarded.  Patient with Multiorgan failure and at high risk for cardiac arrest and death.  Will update family when they arrive   Lucie Leather, M.D.  [facility]  Medical Director [facility] Medical Director [facility] Cardio-Pulmonary Department

## 2015-08-29 ENCOUNTER — Inpatient Hospital Stay: Payer: Medicare Other

## 2015-08-29 LAB — GLUCOSE, CAPILLARY
GLUCOSE-CAPILLARY: 111 mg/dL — AB (ref 65–99)
GLUCOSE-CAPILLARY: 146 mg/dL — AB (ref 65–99)
GLUCOSE-CAPILLARY: 150 mg/dL — AB (ref 65–99)
GLUCOSE-CAPILLARY: 156 mg/dL — AB (ref 65–99)
GLUCOSE-CAPILLARY: 161 mg/dL — AB (ref 65–99)
GLUCOSE-CAPILLARY: 170 mg/dL — AB (ref 65–99)
GLUCOSE-CAPILLARY: 174 mg/dL — AB (ref 65–99)
GLUCOSE-CAPILLARY: 176 mg/dL — AB (ref 65–99)
GLUCOSE-CAPILLARY: 186 mg/dL — AB (ref 65–99)
GLUCOSE-CAPILLARY: 188 mg/dL — AB (ref 65–99)
GLUCOSE-CAPILLARY: 206 mg/dL — AB (ref 65–99)
GLUCOSE-CAPILLARY: 216 mg/dL — AB (ref 65–99)
Glucose-Capillary: 161 mg/dL — ABNORMAL HIGH (ref 65–99)
Glucose-Capillary: 178 mg/dL — ABNORMAL HIGH (ref 65–99)
Glucose-Capillary: 159 mg/dL — ABNORMAL HIGH (ref 65–99)
Glucose-Capillary: 174 mg/dL — ABNORMAL HIGH (ref 65–99)
Glucose-Capillary: 166 mg/dL — ABNORMAL HIGH (ref 65–99)

## 2015-08-29 LAB — MAGNESIUM: Magnesium: 1.8 mg/dL (ref 1.7–2.4)

## 2015-08-29 LAB — BASIC METABOLIC PANEL
Anion gap: 3 — ABNORMAL LOW (ref 5–15)
BUN: 13 mg/dL (ref 6–20)
CO2: 30 mmol/L (ref 22–32)
Calcium: 8.4 mg/dL — ABNORMAL LOW (ref 8.9–10.3)
Chloride: 99 mmol/L — ABNORMAL LOW (ref 101–111)
Creatinine, Ser: 0.52 mg/dL (ref 0.44–1.00)
GFR calc Af Amer: >60 mL/min (ref >60)
Glucose, Bld: 196 mg/dL — ABNORMAL HIGH (ref 65–99)
POTASSIUM: 3 mmol/L — AB (ref 3.5–5.1)
Sodium: 132 mmol/L — ABNORMAL LOW (ref 135–145)

## 2015-08-29 LAB — URINALYSIS COMPLETE WITH MICROSCOPIC (ARMC ONLY)
BILIRUBIN URINE: NEGATIVE
GLUCOSE, UA: NEGATIVE mg/dL
Hgb urine dipstick: NEGATIVE
KETONES UR: NEGATIVE mg/dL
LEUKOCYTES UA: NEGATIVE
NITRITE: NEGATIVE
PH: 5 (ref 5.0–8.0)
Protein, ur: NEGATIVE mg/dL
SPECIFIC GRAVITY, URINE: 1.018 (ref 1.005–1.030)

## 2015-08-29 LAB — SODIUM
SODIUM: 132 mmol/L — AB (ref 135–145)
SODIUM: 130 mmol/L — AB (ref 135–145)
SODIUM: 131 mmol/L — AB (ref 135–145)
Sodium: 131 mmol/L — ABNORMAL LOW (ref 135–145)

## 2015-08-29 LAB — CBC
HCT: 31.5 % — ABNORMAL LOW (ref 35.0–47.0)
Hemoglobin: 10.5 g/dL — ABNORMAL LOW (ref 12.0–16.0)
MCH: 30.9 pg (ref 26.0–34.0)
MCHC: 33.3 g/dL (ref 32.0–36.0)
MCV: 92.9 fL (ref 80.0–100.0)
PLATELETS: 269 10*3/uL (ref 150–440)
RBC: 3.39 MIL/uL — AB (ref 3.80–5.20)
RDW: 15.4 % — ABNORMAL HIGH (ref 11.5–14.5)
WBC: 7.7 10*3/uL (ref 3.6–11.0)

## 2015-08-29 MED ORDER — SODIUM CHLORIDE 0.9 % IV SOLN
INTRAVENOUS | Status: DC
  Administered 2015-08-29: 2.1 [IU]/h via INTRAVENOUS
  Administered 2015-08-29: 1.1 [IU]/h via INTRAVENOUS
  Filled 2015-08-29: qty 2.5

## 2015-08-29 MED ORDER — POTASSIUM CHLORIDE 10 MEQ/100ML IV SOLN
10.0000 meq | INTRAVENOUS | Status: AC
  Administered 2015-08-29 (×4): 10 meq via INTRAVENOUS
  Filled 2015-08-29 (×4): qty 100

## 2015-08-29 NOTE — Progress Notes (Signed)
Subjective:   remains on ventilator, critically ill Urine output improved to 2800 cc Sodium improved to 132 Potassium 3.0 Nursing staff reports V. tach yesterday. Patient was placed on amiodarone and electrolytes were replaced    Objective:  Vital signs in last 24 hours:  Temp:  [98.9 F (37.2 C)-100.3 F (37.9 C)] 100.3 F (37.9 C) (11/06 0700) Pulse Rate:  [68-94] 89 (11/06 0900) Resp:  [12-14] 12 (11/06 0900) BP: (108-153)/(43-66) 115/52 mmHg (11/06 0900) SpO2:  [94 %-97 %] 95 % (11/06 0900) FiO2 (%):  [24 %] 24 % (11/06 0822) Weight:  [76.7 kg (169 lb 1.5 oz)] 76.7 kg (169 lb 1.5 oz) (11/06 0822)  Weight change:  Filed Weights   08/27/15 0425 08/28/15 0635 08/29/15 0822  Weight: 78.6 kg (173 lb 4.5 oz) 77.4 kg (170 lb 10.2 oz) 76.7 kg (169 lb 1.5 oz)    Intake/Output:    Intake/Output Summary (Last 24 hours) at 08/29/15 1019 Last data filed at 08/29/15 0747  Gross per 24 hour  Intake 251.68 ml  Output   3005 ml  Net -2753.32 ml     Physical Exam: General:  cushingoid appearance   HEENT  ETT  Neck  supple   Pulm/lungs  Clear ant/lat , vent assisted  CVS/Heart  irregular rhythm, no rub, soft systolic murmur   Abdomen:   obese, soft, nontender, nondistended   Extremities:  + dependent peripheral edema   Neurologic:  lethargic               Basic Metabolic Panel:   Recent Labs Lab 08/26/15 0412 08/26/15 1653 08/27/15 0439  08/28/15 0420 08/28/15 1151 08/28/15 1710 08/28/15 2300 08/29/15 0530  NA 123* 123* 127*  < > 130* 130* 131* 131* 132*  K 3.9 3.8 3.8  --  3.3*  --   --   --  3.0*  CL 92* 91* 94*  --  100*  --   --   --  99*  CO2 25 26 28   --  23  --   --   --  30  GLUCOSE 116* 109* 114*  --  120*  --   --   --  196*  BUN 13 12 13   --  16  --   --   --  13  CREATININE 0.37* 0.51 0.71  --  0.82  --   --   --  0.52  CALCIUM 8.4* 8.6* 8.6*  --  8.5*  --   --   --  8.4*  MG  --   --   --   --  1.8  --   --   --   --   PHOS  --   --   --    --  2.3*  --   --   --   --   < > = values in this interval not displayed.   CBC:  Recent Labs Lab 05-May-2015 1136  08/26/15 0412 08/26/15 1653 08/27/15 0439 08/28/15 0420 08/29/15 0530  WBC 8.3  < > 5.7 5.5 5.1 5.9 7.7  NEUTROABS 6.8*  --   --   --   --   --   --   HGB 12.2  < > 11.1* 11.4* 11.9* 9.4* 10.5*  HCT 35.6  < > 33.1* 33.8* 35.7 28.2* 31.5*  MCV 92.2  < > 93.4 92.5 94.3 94.0 92.9  PLT 302  < > 296 301 305 236 269  < > = values  in this interval not displayed.    Microbiology:  Recent Results (from the past 720 hour(s))  Urine culture     Status: None   Collection Time: 08/20/15 10:11 PM  Result Value Ref Range Status   Specimen Description URINE, RANDOM  Final   Special Requests NONE  Final   Culture >=100,000 COLONIES/mL ESCHERICHIA COLI  Final   Report Status 08/26/2015 FINAL  Final   Organism ID, Bacteria ESCHERICHIA COLI  Final      Susceptibility   Escherichia coli - MIC*    AMPICILLIN 4 SENSITIVE Sensitive     CEFTAZIDIME <=1 SENSITIVE Sensitive     CEFAZOLIN <=4 SENSITIVE Sensitive     CEFTRIAXONE <=1 SENSITIVE Sensitive     CIPROFLOXACIN <=0.25 SENSITIVE Sensitive     GENTAMICIN <=1 SENSITIVE Sensitive     IMIPENEM <=0.25 SENSITIVE Sensitive     TRIMETH/SULFA <=20 SENSITIVE Sensitive     PIP/TAZO Value in next row Sensitive      SENSITIVE<=4    * >=100,000 COLONIES/mL ESCHERICHIA COLI  MRSA PCR Screening     Status: None   Collection Time: 08/26/15  4:00 PM  Result Value Ref Range Status   MRSA by PCR NEGATIVE NEGATIVE Final    Comment:        The GeneXpert MRSA Assay (FDA approved for NASAL specimens only), is one component of a comprehensive MRSA colonization surveillance program. It is not intended to diagnose MRSA infection nor to guide or monitor treatment for MRSA infections.     Coagulation Studies: No results for input(s): LABPROT, INR in the last 72 hours.  Urinalysis: No results for input(s): COLORURINE, LABSPEC, PHURINE,  GLUCOSEU, HGBUR, BILIRUBINUR, KETONESUR, PROTEINUR, UROBILINOGEN, NITRITE, LEUKOCYTESUR in the last 72 hours.  Invalid input(s): APPERANCEUR    Imaging: Dg Chest Port 1 View  08/28/2015  CLINICAL DATA:  81 year old female patient with shortness of breath. EXAM: PORTABLE CHEST 1 VIEW COMPARISON:  Chest x-ray 08/27/2015. FINDINGS: Endotracheal tube is in position with tip immediately above the level of the carina and directed toward the orifice of the right mainstem bronchus. A nasogastric tube is seen extending into the stomach, however, the tip of the nasogastric tube extends below the lower margin of the image. There is a left-sided internal jugular central venous catheter with tip terminating in the superior cavoatrial junction. Lung volumes are low and there are bibasilar opacities which may reflect areas of atelectasis and/or consolidation. Mild blunting of the costophrenic sulci bilaterally may suggest small bilateral pleural effusions. No evidence of pulmonary edema. Heart size appears borderline enlarged, accentuated by low lung volumes, portable technique and patient's rotation to the left, which also grossly distorts mediastinal contours. Atherosclerosis in the thoracic aorta. IMPRESSION: 1. Support apparatus, as above. 2. Low lung volumes with bibasilar areas of atelectasis and/or consolidation and probable small bilateral pleural effusions. 3. Atherosclerosis. Electronically Signed   By: Trudie Reed M.D.   On: 08/28/2015 09:34   Dg Chest Port 1 View  08/27/2015  CLINICAL DATA:  Ventilatory support, respiratory failure, left IJ central line insertion EXAM: PORTABLE CHEST 1 VIEW COMPARISON:  08/27/2015 FINDINGS: Endotracheal tube 2.8 cm above the carina. NG tube extends below the hemidiaphragms into the stomach, tip not visualized. Left IJ central line tip SVC RA junction level. Persistent low lung volumes with vascular congestion and basilar atelectasis. Small effusions suspected. No  pneumothorax. Stable heart size. IMPRESSION: Support apparatus as above Persistent low lung volumes with vascular congestion and basilar atelectasis. Electronically Signed  By: Osvaldo Shipper M.D.   On: 08/27/2015 14:00   Dg Abd Portable 1v  08/27/2015  CLINICAL DATA:  Encounter for central line placement. EXAM: PORTABLE ABDOMEN - 1 VIEW COMPARISON:  August 19, 2015. FINDINGS: The bowel gas pattern is normal. Nasogastric tube tip is seen in expected position of stomach. Status post surgical posterior fusion of lumbar spine. IMPRESSION: No evidence of bowel obstruction or ileus. Nasogastric tube tip seen in expected position of stomach. Electronically Signed   By: Lupita Raider, M.D.   On: 08/27/2015 13:58     Medications:   . amiodarone 30 mg/hr (08/29/15 0416)  . fentaNYL infusion INTRAVENOUS 10 mcg/hr (08/28/15 1816)  . insulin (NOVOLIN-R) infusion 1.1 Units/hr (08/29/15 0933)  . norepinephrine Stopped (08/29/15 6045)   . antiseptic oral rinse  7 mL Mouth Rinse QID  . aspirin  81 mg Per Tube Daily  . chlorhexidine gluconate  15 mL Mouth Rinse BID  . cholecalciferol  2,000 Units Oral Daily  . clindamycin (CLEOCIN) IV  600 mg Intravenous 3 times per day  . enoxaparin (LOVENOX) injection  40 mg Subcutaneous Q24H  . feeding supplement (VITAL HIGH PROTEIN)  1,000 mL Per Tube Q24H  . ferrous sulfate  325 mg Oral Once per day on Mon Wed Fri   And  . vitamin C  250 mg Oral Once per day on Mon Wed Fri  . free water  25 mL Per Tube 6 times per day  . furosemide  40 mg Intravenous Once  . hydrocortisone  10 mg Per Tube BID  . levothyroxine  75 mcg Intravenous Daily  . lidocaine  1 patch Transdermal Daily  . pantoprazole sodium  40 mg Per Tube Daily  . polyethylene glycol  17 g Per Tube Daily  . potassium chloride  10 mEq Intravenous Q1 Hr x 4  . pramipexole  0.25 mg Per Tube TID  . senna-docusate  1 tablet Per Tube BID  . sodium chloride  1 g Per Tube BID   acetaminophen **OR**  acetaminophen, HYDROcodone-acetaminophen, loperamide, magnesium hydroxide, midazolam, ondansetron **OR** ondansetron (ZOFRAN) IV, ondansetron  Assessment/ Plan:  81 y.o. female With history of hypo-thyroidism, hypertension, degenerative joint disease, depression, TIAs, GERD, irritable bowel syndrome, restless leg syndrome, coronary disease, hyponatremia and mild renal insufficiency  1. Hyponatremia  Chronic problem but her level was 133 back in August  urine osmolality > S Osm suggesting SIADH  noncontrast CT shows no progression in pulm nodules  Salt and Lasix did not have satisfactory improvement   Tolvaptan given daily for 3 doses.   level has now improved to 132    2. Hypokalemia - agree with aggressive replacement  3. Pulm edema - dc IV fluids - vent assisted now.    LOS: 6 Swayze Kozuch 11/6/201610:19 AM

## 2015-08-29 NOTE — Progress Notes (Signed)
Assessed gastric residual prior to initiating next tube feed - of yellow, bile-appearing fluid aspirated and returned. MM, Student nurse

## 2015-08-29 NOTE — Progress Notes (Signed)
[facility] [place] [facility]   PATIENT NAME: Laura BureauFrances Dingus    MR#:  [id number]  DATE OF BIRTH:  [birth year]  SUBJECTIVE:  CHIEF COMPLAINT:   Chief Complaint  Patient presents with  . Abdominal Pain    x 2 weeks   -patient remains on vent, had an episode of v tach last night- electrolytes replaced, on amiodarone drip - off levophed, on fentanyl for sedation- following some simple commands  REVIEW OF SYSTEMS:  Review of Systems  Unable to perform ROS: intubated    DRUG ALLERGIES:   Allergies  Allergen Reactions  . Strawberry Extract Anaphylaxis    Pt states her throat starts to close.   . Adhesive [Tape] Other (See Comments)    unknown  . Codeine Other (See Comments)    unknown  . Furosemide Other (See Comments)    unknown  . Macrodantin [Nitrofurantoin Macrocrystal] Nausea Only  . Penicillins Hives  . Sulfa Antibiotics Other (See Comments)    unknown  . Valium [Diazepam] Other (See Comments)    Altered mental status  . Ziac [Bisoprolol-Hydrochlorothiazide] Other (See Comments)    unknown    VITALS:  Blood pressure 115/52, pulse 89, temperature 100.3 F (37.9 C), temperature source Oral, resp. rate 12, height 5\' 2"  (1.575 m), weight 76.7 kg (169 lb 1.5 oz), SpO2 95 %.  PHYSICAL EXAMINATION:  Physical Exam  Constitutional: Chronically ill-appearing patient, intubated and sedated HENT:  Head: Normocephalic and atraumatic.  Intubated. Endotracheal tube in proper position  Eyes: Conjunctivae and EOM are normal. Pupils are equal, round, and reactive to light.  Neck: Normal range of motion. Neck supple. No tracheal deviation present. No thyromegaly present.  Cardiovascular: Normal rate, regular rhythm and normal heart sounds.  Pulmonary/Chest: Effort normal and breath sounds normal. No respiratory distress. She has no wheezes. Coarse rhonchi heard on auscultation today. She exhibits no tenderness. Decreased bibasilar breath sounds.   Abdominal: Soft. Bowel sounds are normal. She exhibits no distension. There is no tenderness.  Musculoskeletal: Normal range of motion.  Neurological: Patient is sedated. Arousable, gets anxious at times, Following some simple commands. Able to move all extremities in bed. No cranial nerve deficit.  Unable to evaluate in detail due to her being on sedation for intubation  Skin: Skin is warm and dry. No rash noted.  Psychiatric: Mood and affect normal.  Sedated, intubated    LABORATORY PANEL:   CBC  Recent Labs Lab 08/29/15 0530  WBC 7.7  HGB 10.5*  HCT 31.5*  PLT 269   ------------------------------------------------------------------------------------------------------------------  Chemistries   Recent Labs Lab 08/26/15 1653  08/28/15 0420  08/29/15 0530  NA 123*  < > 130*  < > 132*  K 3.8  < > 3.3*  --  3.0*  CL 91*  < > 100*  --  99*  CO2 26  < > 23  --  30  GLUCOSE 109*  < > 120*  --  196*  BUN 12  < > 16  --  13  CREATININE 0.51  < > 0.82  --  0.52  CALCIUM 8.6*  < > 8.5*  --  8.4*  MG  --   --  1.8  --   --   AST 47*  --   --   --   --   ALT 71*  --   --   --   --   ALKPHOS 65  --   --   --   --  BILITOT 0.5  --   --   --   --   < > = values in this interval not displayed. ------------------------------------------------------------------------------------------------------------------  Cardiac Enzymes  Recent Labs Lab 08/24/15 0513  TROPONINI <0.03   ------------------------------------------------------------------------------------------------------------------  RADIOLOGY:  Dg Chest Port 1 View  08/28/2015  CLINICAL DATA:  81 year old female patient with shortness of breath. EXAM: PORTABLE CHEST 1 VIEW COMPARISON:  Chest x-ray 08/27/2015. FINDINGS: Endotracheal tube is in position with tip immediately above the level of the carina and directed toward the orifice of the right mainstem bronchus. A nasogastric tube is seen extending into the stomach,  however, the tip of the nasogastric tube extends below the lower margin of the image. There is a left-sided internal jugular central venous catheter with tip terminating in the superior cavoatrial junction. Lung volumes are low and there are bibasilar opacities which may reflect areas of atelectasis and/or consolidation. Mild blunting of the costophrenic sulci bilaterally may suggest small bilateral pleural effusions. No evidence of pulmonary edema. Heart size appears borderline enlarged, accentuated by low lung volumes, portable technique and patient's rotation to the left, which also grossly distorts mediastinal contours. Atherosclerosis in the thoracic aorta. IMPRESSION: 1. Support apparatus, as above. 2. Low lung volumes with bibasilar areas of atelectasis and/or consolidation and probable small bilateral pleural effusions. 3. Atherosclerosis. Electronically Signed   By: Trudie Reed M.D.   On: 08/28/2015 09:34   Dg Chest Port 1 View  08/27/2015  CLINICAL DATA:  Ventilatory support, respiratory failure, left IJ central line insertion EXAM: PORTABLE CHEST 1 VIEW COMPARISON:  08/27/2015 FINDINGS: Endotracheal tube 2.8 cm above the carina. NG tube extends below the hemidiaphragms into the stomach, tip not visualized. Left IJ central line tip SVC RA junction level. Persistent low lung volumes with vascular congestion and basilar atelectasis. Small effusions suspected. No pneumothorax. Stable heart size. IMPRESSION: Support apparatus as above Persistent low lung volumes with vascular congestion and basilar atelectasis. Electronically Signed   By: Judie Petit.  Shick M.D.   On: 08/27/2015 14:00   Dg Abd Portable 1v  08/27/2015  CLINICAL DATA:  Encounter for central line placement. EXAM: PORTABLE ABDOMEN - 1 VIEW COMPARISON:  August 19, 2015. FINDINGS: The bowel gas pattern is normal. Nasogastric tube tip is seen in expected position of stomach. Status post surgical posterior fusion of lumbar spine. IMPRESSION: No  evidence of bowel obstruction or ileus. Nasogastric tube tip seen in expected position of stomach. Electronically Signed   By: Lupita Raider, M.D.   On: 08/27/2015 13:58    EKG:   Orders placed or performed during the hospital encounter of 08-30-2015  . ED EKG  . ED EKG  . EKG 12-Lead  . EKG 12-Lead    ASSESSMENT AND PLAN:   81 year old female with past medical history significant for hypertension, diastolic CHF, SIADH, Escherichia coli UTI as outpatient presented to the hospital secondary to worsening weakness and confusion. Noted to have hyponatremia on presentation.  #1 acute on chronic hyponatremia-known history of SIADH. Sodium on admission was 120. -Appreciate nephrology consult. off tolvaptan now -Sodium has improved.  #2 acute respiratory failure-intubated for respiratory acidosis. -Remains on ventilator, minimal vent settings at this time. FiO2 of 24%. -Management per pulmonary team. -SBT, not today due to v tach last night, will let her rest on vent -Echocardiogram gram with normal ejection fraction of 60-65%. Likely has diastolic dysfunction. But elevated pulmonary artery pressures  #3 acute sialoadenitis and recent Escherichia coli UTI. Continue clindamycin. Appreciate ENT evaluation. -Finished  ciprofloxacin for UTI - low grade fever today- check ua and cxr again  #4 v tach- electrolytes replaced, in nsr now - cardiology f/u - cont amiodarone drip for now - recent Echo normal  #5 gastroesophageal reflux disease-also has a large hiatal hernia on chest x-ray. -Continue PPI  #6 history of adrenal insufficiency-continue oral hydrocortisone. -Per intensivist, no indication for stress dose steroids at this time.  #7 DVT prophylaxis-on Lovenox  Dr. Belia Heman will  Discuss with  the family members this morning. i have updated the daughter at bedside.   All the records are reviewed and case discussed with Care Management/Social Workerr. Management plans discussed with  the patient, family and they are in agreement.  CODE STATUS: DO NOT RESUSCITATE  TOTAL CRITICAL CARE TIME SPENT IN TAKING CARE OF THIS PATIENT: 38 minutes.   POSSIBLE D/C IN 3-4 DAYS, DEPENDING ON CLINICAL CONDITION.   Enid Baas M.D on 08/29/2015 at 10:06 AM  Between 7am to 6pm - Pager - [phone]  After 6pm go to [email or web address] - password [email or web address]  [facility]  Office  [phone]  CC: Primary care physician; Danella Penton., MD

## 2015-08-29 NOTE — Consult Note (Signed)
[facility] Consultation     ASSESSMENT/PLAN   The patient is an 81 year old Caucasian female with history significant for adrenal insufficiency, SIADH, as well as obstructive sleep apnea, morbid obesity, chronic hypoventilation. Currently, she has had reduced mental status and resp failure in setting of submandibular cellulitis with low sodium levels, she was transferred to the ICU for closer monitoring. Likely related to sepsis/UTI with Subsequent respiratory failure and encephalopathy.   PULMONARY  A: Acute hypoxic respiratory failure. Intubated,sedated-will not atempt wean today due to Vtach last night -Acute on chronic hypercapnic respiratory failure, with chronic hypoventilation. -element of obesity hypoventilation syndrome, with obstructive sleep apnea. -Elevated/paralyzed right diaphragm with likely restrictive lung disease. This may be due to morbid obesity and/or enlarged liver. -Lung nodules, which appear to have been stable for at least the last 6 months. -CT finding suggestive of congestive heart failure and pulmonary hypertension. -Hiatal hernia which may be causing some degree of reflux and aspiration. -Restless leg syndrome.  P:   -continue full vent support, follow up ABg and CXR as needed -Pulmonary hypertension, likely secondary to mild LV dysfunction, as well as OSA, and obesity. -will attempt SAT/SBt in 1-2 days   CARDIOVASCULAR  A: LV diastolic dysfunction. Vtach last night -Atrial fibrillation. P:  Continue blood pressure control. -started on amiodarone  RENAL A: SIADH, currently on tolvaptan, nephrology following. -Adrenal insufficiency, currently on steroid replacement. P:   Continue tolvaptan per nephrology.  GASTROINTESTINAL A:  GERD, hiatal hernia. -History of colitis, history of pancreatitis. P:   Continue PPI   INFECTIOUS A: UTI with Escherichia coli/Sialodinitis. P:  Continue clindaymycin   ENDOCRINE A:  History of adrenal insufficiency, currently on steroid replacement with cushingoid features. P:   Continue steroids, no need for stress dose at this point.  NEUROLOGIC --Altered mental status, likely due to metabolic encephalopathy, and multifactorial from above conditions. We'll continue to monitor.  CT chest from 08/24/15, comparison with 03/24/15; unchanged pulmonary nodules. Moderate to large hiatal hernia. Morbid obesity with bibasilar atelectasis. Enlarged liver with elevated/paralyzed right diaphragm.   Echocardiogram from 08/24/2015: Ejection fraction of 60 to 65%. Pulmonary artery systolic pressure elevated at 51 mmHg. ---------------------------------------  ---------------------------------------   Name: [name] MRN: [id number] DOB: 02/09/1929    ADMISSION DATE:  08-25-2015 CONSULTATION DATE:  08/26/2015  REFERRING MD :  Dr. Sherryll Burger  CHIEF COMPLAINT:  Altered mental status.   HISTORY OF PRESENT ILLNESS:    Patient was intubated,sedated Patient with V-tach started on amiodarone High risk for cardiac arrest, will not wean vent today   REVIEW OF SYSTEMS:   Review of Systems  Unable to perform ROS: critical illness      VITAL SIGNS: Temp:  [98.9 F (37.2 C)-99.2 F (37.3 C)] 98.9 F (37.2 C) (11/05 1500) Pulse Rate:  [63-94] 80 (11/06 0100) Resp:  [12-14] 12 (11/06 0100) BP: (108-153)/(43-66) 133/57 mmHg (11/06 0100) SpO2:  [94 %-97 %] 97 % (11/06 0100) FiO2 (%):  [24 %] 24 % (11/06 0449) HEMODYNAMICS:   VENTILATOR SETTINGS: Vent Mode:  [-] PRVC FiO2 (%):  [24 %] 24 % Set Rate:  [12 bmp] 12 bmp Vt Set:  [500 mL] 500 mL PEEP:  [5 cmH20] 5 cmH20 Plateau Pressure:  [19 cmH20] 19 cmH20 INTAKE / OUTPUT:  Intake/Output Summary (Last 24 hours) at 08/29/15 0800 Last data filed at 08/29/15 0747  Gross per 24 hour  Intake 251.68 ml  Output   3005 ml  Net -2753.32 ml    Physical  Examination:   VS: BP 133/57 mmHg  Pulse 80  Temp(Src) 98.9 F (37.2  C) (Oral)  Resp 12  Ht 5\' 2"  (1.575 m)  Wt 170 lb 10.2 oz (77.4 kg)  BMI 31.20 kg/m2  SpO2 97%  General Appearance: sedated,intubated GCS8T  HEENT: PERRLA, EOM intact, no ptosis, no other lesions noticed;  Pulmonary: rhonchi CardiovascularNormal S1,S2.  No m/r/g.    Abdomen: Benign, Soft, non-tender, No masses, Renal:  No costovertebral tenderness  GU:  Not performed at this time. Endoc: No evident thyromegaly Skin:   warm, no rashes, no ecchymosis  Extremities: normal, no cyanosis, clubbing, no edema, warm with normal capillary refill.    LABS: Reviewed   LABORATORY PANEL:   CBC  Recent Labs Lab 08/29/15 0530  WBC 7.7  HGB 10.5*  HCT 31.5*  PLT 269    Chemistries   Recent Labs Lab 08/26/15 1653  08/28/15 0420  08/29/15 0530  NA 123*  < > 130*  < > 132*  K 3.8  < > 3.3*  --  3.0*  CL 91*  < > 100*  --  99*  CO2 26  < > 23  --  30  GLUCOSE 109*  < > 120*  --  196*  BUN 12  < > 16  --  13  CREATININE 0.51  < > 0.82  --  0.52  CALCIUM 8.6*  < > 8.5*  --  8.4*  MG  --   --  1.8  --   --   PHOS  --   --  2.3*  --   --   AST 47*  --   --   --   --   ALT 71*  --   --   --   --   ALKPHOS 65  --   --   --   --   BILITOT 0.5  --   --   --   --   < > = values in this interval not displayed.   Recent Labs Lab 08/26/15 1627  GLUCAP 113*    Recent Labs Lab 08/27/15 1215 08/27/15 1450 08/27/15 1700  PHART 7.29* 7.53* 7.33*  PCO2ART 53* 25* 45  PO2ART 107 102 85    Recent Labs Lab 08/07/2015 1136 08/26/15 1653  AST 27 47*  ALT 49 71*  ALKPHOS 59 65  BILITOT 0.4 0.5  ALBUMIN 3.5 3.3*    Cardiac Enzymes  Recent Labs Lab 08/24/15 0513  TROPONINI <0.03    RADIOLOGY:  Dg Chest Port 1 View  08/28/2015  CLINICAL DATA:  81 year old female patient with shortness of breath. EXAM: PORTABLE CHEST 1 VIEW COMPARISON:  Chest x-ray 08/27/2015. FINDINGS: Endotracheal tube is in position with tip immediately above the level of the carina and directed  toward the orifice of the right mainstem bronchus. A nasogastric tube is seen extending into the stomach, however, the tip of the nasogastric tube extends below the lower margin of the image. There is a left-sided internal jugular central venous catheter with tip terminating in the superior cavoatrial junction. Lung volumes are low and there are bibasilar opacities which may reflect areas of atelectasis and/or consolidation. Mild blunting of the costophrenic sulci bilaterally may suggest small bilateral pleural effusions. No evidence of pulmonary edema. Heart size appears borderline enlarged, accentuated by low lung volumes, portable technique and patient's rotation to the left, which also grossly distorts mediastinal contours. Atherosclerosis in the thoracic aorta. IMPRESSION: 1. Support apparatus, as above. 2.  Low lung volumes with bibasilar areas of atelectasis and/or consolidation and probable small bilateral pleural effusions. 3. Atherosclerosis. Electronically Signed   By: Trudie Reed M.D.   On: 08/28/2015 09:34   Dg Chest Port 1 View  08/27/2015  CLINICAL DATA:  Ventilatory support, respiratory failure, left IJ central line insertion EXAM: PORTABLE CHEST 1 VIEW COMPARISON:  08/27/2015 FINDINGS: Endotracheal tube 2.8 cm above the carina. NG tube extends below the hemidiaphragms into the stomach, tip not visualized. Left IJ central line tip SVC RA junction level. Persistent low lung volumes with vascular congestion and basilar atelectasis. Small effusions suspected. No pneumothorax. Stable heart size. IMPRESSION: Support apparatus as above Persistent low lung volumes with vascular congestion and basilar atelectasis. Electronically Signed   By: Judie Petit.  Shick M.D.   On: 08/27/2015 14:00   Dg Abd Portable 1v  08/27/2015  CLINICAL DATA:  Encounter for central line placement. EXAM: PORTABLE ABDOMEN - 1 VIEW COMPARISON:  August 19, 2015. FINDINGS: The bowel gas pattern is normal. Nasogastric tube tip is seen  in expected position of stomach. Status post surgical posterior fusion of lumbar spine. IMPRESSION: No evidence of bowel obstruction or ileus. Nasogastric tube tip seen in expected position of stomach. Electronically Signed   By: Lupita Raider, M.D.   On: 08/27/2015 13:58     I have personally obtained a history, examined the patient, evaluated Pertinent laboratory and RadioGraphic/imaging results, and  formulated the assessment and plan   The Patient requires high complexity decision making for assessment and support, frequent evaluation and titration of therapies, application of advanced monitoring technologies and extensive interpretation of multiple databases. Critical Care Time devoted to patient care services described in this note is 40 minutes.   Overall, patient is critically ill, prognosis is guarded.  Patient with Multiorgan failure and at high risk for cardiac arrest and death.  Will update family when they arrive   Lucie Leather, M.D.  [facility]  Medical Director [facility] Medical Director [facility] Cardio-Pulmonary Department

## 2015-08-30 DIAGNOSIS — L8990 Pressure ulcer of unspecified site, unspecified stage: Secondary | ICD-10-CM | POA: Insufficient documentation

## 2015-08-30 LAB — GLUCOSE, CAPILLARY
GLUCOSE-CAPILLARY: 110 mg/dL — AB (ref 65–99)
GLUCOSE-CAPILLARY: 124 mg/dL — AB (ref 65–99)
GLUCOSE-CAPILLARY: 126 mg/dL — AB (ref 65–99)
GLUCOSE-CAPILLARY: 137 mg/dL — AB (ref 65–99)
GLUCOSE-CAPILLARY: 137 mg/dL — AB (ref 65–99)
GLUCOSE-CAPILLARY: 138 mg/dL — AB (ref 65–99)
GLUCOSE-CAPILLARY: 152 mg/dL — AB (ref 65–99)
GLUCOSE-CAPILLARY: 161 mg/dL — AB (ref 65–99)
GLUCOSE-CAPILLARY: 162 mg/dL — AB (ref 65–99)
GLUCOSE-CAPILLARY: 172 mg/dL — AB (ref 65–99)
Glucose-Capillary: 162 mg/dL — ABNORMAL HIGH (ref 65–99)
Glucose-Capillary: 156 mg/dL — ABNORMAL HIGH (ref 65–99)
Glucose-Capillary: 147 mg/dL — ABNORMAL HIGH (ref 65–99)
Glucose-Capillary: 142 mg/dL — ABNORMAL HIGH (ref 65–99)
Glucose-Capillary: 143 mg/dL — ABNORMAL HIGH (ref 65–99)
Glucose-Capillary: 115 mg/dL — ABNORMAL HIGH (ref 65–99)

## 2015-08-30 LAB — CBC
HCT: 29.4 % — ABNORMAL LOW (ref 35.0–47.0)
Hemoglobin: 9.6 g/dL — ABNORMAL LOW (ref 12.0–16.0)
MCH: 30.6 pg (ref 26.0–34.0)
MCHC: 32.7 g/dL (ref 32.0–36.0)
MCV: 93.6 fL (ref 80.0–100.0)
PLATELETS: 249 10*3/uL (ref 150–440)
RBC: 3.14 MIL/uL — ABNORMAL LOW (ref 3.80–5.20)
RDW: 15.7 % — ABNORMAL HIGH (ref 11.5–14.5)
WBC: 8.3 10*3/uL (ref 3.6–11.0)

## 2015-08-30 LAB — BASIC METABOLIC PANEL
Anion gap: 5 (ref 5–15)
BUN: 16 mg/dL (ref 6–20)
CO2: 30 mmol/L (ref 22–32)
CREATININE: 0.49 mg/dL (ref 0.44–1.00)
Calcium: 8.5 mg/dL — ABNORMAL LOW (ref 8.9–10.3)
Chloride: 98 mmol/L — ABNORMAL LOW (ref 101–111)
GFR calc Af Amer: >60 mL/min (ref >60)
GFR calc non Af Amer: >60 mL/min (ref >60)
GLUCOSE: 153 mg/dL — AB (ref 65–99)
Potassium: 3.7 mmol/L (ref 3.5–5.1)
Sodium: 133 mmol/L — ABNORMAL LOW (ref 135–145)

## 2015-08-30 LAB — EXPECTORATED SPUTUM ASSESSMENT W REFEX TO RESP CULTURE

## 2015-08-30 LAB — EXPECTORATED SPUTUM ASSESSMENT W GRAM STAIN, RFLX TO RESP C

## 2015-08-30 MED ORDER — FLORANEX PO PACK
1.0000 g | PACK | Freq: Once | ORAL | Status: DC
  Filled 2015-08-30: qty 1

## 2015-08-30 MED ORDER — LEVOTHYROXINE SODIUM 75 MCG PO TABS
125.0000 ug | ORAL_TABLET | Freq: Every day | ORAL | Status: DC
  Administered 2015-08-30 – 2015-09-01 (×3): 125 ug via ORAL
  Filled 2015-08-30 (×3): qty 1

## 2015-08-30 MED ORDER — RISAQUAD PO CAPS: 1.0000 | ORAL_CAPSULE | Freq: Three times a day (TID) | ORAL | Status: DC

## 2015-08-30 MED ORDER — FLORANEX PO PACK
1.0000 g | PACK | Freq: Three times a day (TID) | ORAL | Status: DC
  Filled 2015-08-30 (×3): qty 1

## 2015-08-30 MED ORDER — INSULIN ASPART 100 UNIT/ML ~~LOC~~ SOLN
0.0000 [IU] | SUBCUTANEOUS | Status: DC
  Administered 2015-08-30: 2 [IU] via SUBCUTANEOUS
  Administered 2015-09-01: 8 [IU] via SUBCUTANEOUS
  Filled 2015-08-30: qty 8
  Filled 2015-08-30: qty 2

## 2015-08-30 MED ORDER — AMIODARONE HCL 200 MG PO TABS
400.0000 mg | ORAL_TABLET | Freq: Two times a day (BID) | ORAL | Status: DC
  Administered 2015-08-30 – 2015-09-01 (×5): 400 mg via ORAL
  Filled 2015-08-30 (×5): qty 2

## 2015-08-30 MED ORDER — RISAQUAD PO CAPS
1.0000 | ORAL_CAPSULE | Freq: Three times a day (TID) | ORAL | Status: DC
  Administered 2015-08-30 – 2015-09-01 (×7): 1 via ORAL
  Filled 2015-08-30 (×7): qty 1

## 2015-08-30 NOTE — Consult Note (Signed)
[facility] Consultation     ASSESSMENT/PLAN   The patient is an 81 year old Caucasian female with history significant for adrenal insufficiency, SIADH, as well as obstructive sleep apnea, morbid obesity, chronic hypoventilation. She has had reduced mental status and resp failure in setting of submandibular cellulitis with low sodium levels, she was transferred to the ICU for closer monitoring. Likely related to sepsis/UTI with Subsequent respiratory failure and encephalopathy.   PULMONARY  A/P: Acute hypoxic respiratory failure. Intubated,sedated-will attempt wean today  -Acute on chronic hypercapnic respiratory failure, with chronic hypoventilation. -element of obesity hypoventilation syndrome, with obstructive sleep apnea. -Elevated/paralyzed right diaphragm with likely restrictive lung disease. This may be due to morbid obesity and/or enlarged liver. -Lung nodules, which appear to have been stable for at least the last 6 months. -CT finding suggestive of congestive heart failure and pulmonary hypertension. -Hiatal hernia which may be causing some degree of reflux and aspiration. -Restless leg syndrome. -Pulmonary hypertension, likely secondary to mild LV dysfunction, as well as OSA, and obesity. -will attempt SAT/SBT as tolerated   CARDIOVASCULAR  A: LV diastolic dysfunction. Vtach 11/5 -Atrial fibrillation. P:  Continue blood pressure control. -started on amiodarone -monitor lytes  RENAL A: SIADH, currently on tolvaptan, nephrology following. -Adrenal insufficiency, currently on steroid replacement. P:   Continue tolvaptan per nephrology.  GASTROINTESTINAL A:  GERD, hiatal hernia. -History of colitis, history of pancreatitis. P:   Continue PPI   INFECTIOUS A: UTI with Escherichia coli/Sialodinitis. P:  Continue clindaymycin   ENDOCRINE A: History of adrenal insufficiency, currently on steroid replacement with cushingoid features. P:     Continue steroids, no need for stress dose at this point.  NEUROLOGIC --Altered mental status, likely due to metabolic encephalopathy, and multifactorial from above conditions. We'll continue to monitor.  CT chest from 08/24/15, comparison with 03/24/15; unchanged pulmonary nodules. Moderate to large hiatal hernia. Morbid obesity with bibasilar atelectasis. Enlarged liver with elevated/paralyzed right diaphragm.   Echocardiogram from 08/24/2015: Ejection fraction of 60 to 65%. Pulmonary artery systolic pressure elevated at 51 mmHg. ---------------------------------------  ---------------------------------------   Name: [name] MRN: [id number] DOB: 01-11-1929    ADMISSION DATE:  08/04/2015 CONSULTATION DATE:  08/26/2015  REFERRING MD :  Dr. Sherryll Burger  CHIEF COMPLAINT:  Altered mental status.   SUBJECTIVE: This morning, following commands appropriately, on minimal sedation, able to write responses and note pad. Currently, no discomfort or pain. Multiple family members at bedside, updated on plan of care  REVIEW OF SYSTEMS:   Review of Systems  Constitutional: Negative for fever and chills.  Eyes: Negative for blurred vision and double vision.  Respiratory: Positive for shortness of breath. Negative for cough and wheezing.   Cardiovascular: Negative for chest pain and palpitations.  Gastrointestinal: Negative for heartburn and nausea.  Musculoskeletal: Negative for myalgias.  Skin: Negative for itching.      VITAL SIGNS: Temp:  [98.7 F (37.1 C)-99.8 F (37.7 C)] 98.7 F (37.1 C) (11/07 0730) Pulse Rate:  [66-103] 73 (11/07 0800) Resp:  [12-23] 13 (11/07 0800) BP: (92-140)/(37-60) 118/53 mmHg (11/07 0800) SpO2:  [94 %-100 %] 100 % (11/07 0800) FiO2 (%):  [24 %] 24 % (11/07 0759) Weight:  [163 lb 9.3 oz (74.2 kg)] 163 lb 9.3 oz (74.2 kg) (11/07 0500) HEMODYNAMICS:   VENTILATOR SETTINGS: Vent Mode:  [-] PRVC FiO2 (%):  [24 %] 24 % Set Rate:  [12 bmp] 12 bmp Vt  Set:  [500 mL] 500 mL PEEP:  [5 cmH20] 5 cmH20  Plateau Pressure:  [21 cmH20-22 cmH20] 22 cmH20 INTAKE / OUTPUT:  Intake/Output Summary (Last 24 hours) at 08/30/15 0900 Last data filed at 08/30/15 0800  Gross per 24 hour  Intake 3798.32 ml  Output   1100 ml  Net 2698.32 ml    Physical Examination:   VS: BP 118/53 mmHg  Pulse 73  Temp(Src) 98.7 F (37.1 C) (Oral)  Resp 13  Ht  (1.575 m)  Wt 163 lb 9.3 oz (74.2 kg)  BMI 29.91 kg/m2  SpO2 100%  General Appearance: intubated GCS12T  HEENT: PERRLA, EOM intact, no ptosis, no other lesions noticed;  Pulmonary: rhonchi CardiovascularNormal S1,S2.  No m/r/g.    Abdomen: Benign, Soft, non-tender, No masses, Renal:  No costovertebral tenderness  GU:  Not performed at this time. Endoc: No evident thyromegaly Skin:   warm, no rashes, no ecchymosis  Extremities: normal, no cyanosis, clubbing, no edema, warm with normal capillary refill.    LABS: Reviewed   LABORATORY PANEL:   CBC  Recent Labs Lab 08/30/15 0531  WBC 8.3  HGB 9.6*  HCT 29.4*  PLT 249    Chemistries   Recent Labs Lab 08/26/15 1653  08/28/15 0420  08/29/15 1034  08/30/15 0531  NA 123*  < > 130*  < > 132*  < > 133*  K 3.8  < > 3.3*  < >  --   --  3.7  CL 91*  < > 100*  < >  --   --  98*  CO2 26  < > 23  < >  --   --  30  GLUCOSE 109*  < > 120*  < >  --   --  153*  BUN 12  < > 16  < >  --   --  16  CREATININE 0.51  < > 0.82  < >  --   --  0.49  CALCIUM 8.6*  < > 8.5*  < >  --   --  8.5*  MG  --   < > 1.8  --  1.8  --   --   PHOS  --   --  2.3*  --   --   --   --   AST 47*  --   --   --   --   --   --   ALT 71*  --   --   --   --   --   --   ALKPHOS 65  --   --   --   --   --   --   BILITOT 0.5  --   --   --   --   --   --   < > = values in this interval not displayed.   Recent Labs Lab 08/30/15 0355 08/30/15 0455 08/30/15 0605 08/30/15 0659 08/30/15 0712 08/30/15 0802  GLUCAP 147* 138* 137* 152* 142* 143*    Recent Labs Lab  08/27/15 1215 08/27/15 1450 08/27/15 1700  PHART 7.29* 7.53* 7.33*  PCO2ART 53* 25* 45  PO2ART 107 102 85    Recent Labs Lab 09/10/15 1136 08/26/15 1653  AST 27 47*  ALT 49 71*  ALKPHOS 59 65  BILITOT 0.4 0.5  ALBUMIN 3.5 3.3*    Cardiac Enzymes  Recent Labs Lab 08/24/15 0513  TROPONINI <0.03    RADIOLOGY:  Dg Chest Port 1 View  08/29/2015  CLINICAL DATA:  New onset fever this morning. EXAM: PORTABLE CHEST  1 VIEW COMPARISON:  The yesterday. FINDINGS: The endotracheal tube tip is at the carina. Left jugular catheter tip in the superior vena cava. Nasogastric tube extending into the stomach. Poor inspiration. Interval small amount of linear density at the right lung base. Decreased ill-defined opacity in the left lower lobe. Probable small bilateral pleural effusions are again noted. Kyphoplasty material in multiple vertebrae. IMPRESSION: 1. Decreased left lower lobe atelectasis. 2. Interval minimal right basilar atelectasis. 3. Stable probable small bilateral pleural effusions. Electronically Signed   By: Beckie SaltsSteven  Reid M.D.   On: 08/29/2015 12:15     I have personally obtained a history, examined the patient, evaluated Pertinent laboratory and RadioGraphic/imaging results, and  formulated the assessment and plan   The Patient requires high complexity decision making for assessment and support, frequent evaluation and titration of therapies, application of advanced monitoring technologies and extensive interpretation of multiple databases. Critical Care Time devoted to patient care services described in this note is 45 minutes.   Overall, patient is critically ill, prognosis is guarded.  Patient with Multiorgan failure and at high risk for cardiac arrest and death.  Will update family when they arrive   Stephanie AcreVishal Sherial Ebrahim, MD [facility] Pager [phone]- [phone] (please enter 7-digits) On Call Pager - [phone] (please enter 7-digits)

## 2015-08-30 NOTE — Progress Notes (Signed)
Subjective:   Sodium 133 (132) Family at bedside  Off norepinephrine this morning.  T max 100.3 No more episodes of v-tach  Objective:  Vital signs in last 24 hours:  Temp:  [98.7 F (37.1 C)-99.8 F (37.7 C)] 98.7 F (37.1 C) (11/07 0730) Pulse Rate:  [66-103] 73 (11/07 0800) Resp:  [12-23] 13 (11/07 0800) BP: (92-140)/(37-60) 118/53 mmHg (11/07 0800) SpO2:  [94 %-100 %] 100 % (11/07 0800) FiO2 (%):  [24 %] 24 % (11/07 0759) Weight:  [74.2 kg (163 lb 9.3 oz)] 74.2 kg (163 lb 9.3 oz) (11/07 0500)  Weight change:  Filed Weights   08/28/15 0635 08/29/15 0822 08/30/15 0500  Weight: 77.4 kg (170 lb 10.2 oz) 76.7 kg (169 lb 1.5 oz) 74.2 kg (163 lb 9.3 oz)    Intake/Output:    Intake/Output Summary (Last 24 hours) at 08/30/15 0927 Last data filed at 08/30/15 0800  Gross per 24 hour  Intake 3798.32 ml  Output   1100 ml  Net 2698.32 ml     Physical Exam: General:  cushingoid appearance   HEENT  ETT  Neck  LIJ central line  Pulm/lungs  Clear ant/lat , vent assisted FiO 24%  CVS/Heart  irregular rhythm, no rub, soft systolic murmur   Abdomen:   obese, soft, nontender, nondistended   Extremities:  + dependent peripheral edema   Neurologic:  lethargic               Basic Metabolic Panel:   Recent Labs Lab 08/26/15 1653 08/27/15 0439  08/28/15 0420  08/29/15 0530 08/29/15 1034 08/29/15 1728 08/29/15 2250 08/30/15 0531  NA 123* 127*  < > 130*  < > 132* 132* 130* 131* 133*  K 3.8 3.8  --  3.3*  --  3.0*  --   --   --  3.7  CL 91* 94*  --  100*  --  99*  --   --   --  98*  CO2 26 28  --  23  --  30  --   --   --  30  GLUCOSE 109* 114*  --  120*  --  196*  --   --   --  153*  BUN 12 13  --  16  --  13  --   --   --  16  CREATININE 0.51 0.71  --  0.82  --  0.52  --   --   --  0.49  CALCIUM 8.6* 8.6*  --  8.5*  --  8.4*  --   --   --  8.5*  MG  --   --   --  1.8  --   --  1.8  --   --   --   PHOS  --   --   --  2.3*  --   --   --   --   --   --   < > =  values in this interval not displayed.   CBC:  Recent Labs Lab 09-20-15 1136  08/26/15 1653 08/27/15 0439 08/28/15 0420 08/29/15 0530 08/30/15 0531  WBC 8.3  < > 5.5 5.1 5.9 7.7 8.3  NEUTROABS 6.8*  --   --   --   --   --   --   HGB 12.2  < > 11.4* 11.9* 9.4* 10.5* 9.6*  HCT 35.6  < > 33.8* 35.7 28.2* 31.5* 29.4*  MCV 92.2  < > 92.5 94.3 94.0  92.9 93.6  PLT 302  < > 301 305 236 269 249  < > = values in this interval not displayed.    Microbiology:  Recent Results (from the past 720 hour(s))  Urine culture     Status: None   Collection Time: 08/20/15 10:11 PM  Result Value Ref Range Status   Specimen Description URINE, RANDOM  Final   Special Requests NONE  Final   Culture >=100,000 COLONIES/mL ESCHERICHIA COLI  Final   Report Status 08/12/2015 FINAL  Final   Organism ID, Bacteria ESCHERICHIA COLI  Final      Susceptibility   Escherichia coli - MIC*    AMPICILLIN 4 SENSITIVE Sensitive     CEFTAZIDIME <=1 SENSITIVE Sensitive     CEFAZOLIN <=4 SENSITIVE Sensitive     CEFTRIAXONE <=1 SENSITIVE Sensitive     CIPROFLOXACIN <=0.25 SENSITIVE Sensitive     GENTAMICIN <=1 SENSITIVE Sensitive     IMIPENEM <=0.25 SENSITIVE Sensitive     TRIMETH/SULFA <=20 SENSITIVE Sensitive     PIP/TAZO Value in next row Sensitive      SENSITIVE<=4    * >=100,000 COLONIES/mL ESCHERICHIA COLI  MRSA PCR Screening     Status: None   Collection Time: 08/26/15  4:00 PM  Result Value Ref Range Status   MRSA by PCR NEGATIVE NEGATIVE Final    Comment:        The GeneXpert MRSA Assay (FDA approved for NASAL specimens only), is one component of a comprehensive MRSA colonization surveillance program. It is not intended to diagnose MRSA infection nor to guide or monitor treatment for MRSA infections.     Coagulation Studies: No results for input(s): LABPROT, INR in the last 72 hours.  Urinalysis:  Recent Labs  08/29/15 1034  COLORURINE YELLOW*  LABSPEC 1.018  PHURINE 5.0  GLUCOSEU  NEGATIVE  HGBUR NEGATIVE  BILIRUBINUR NEGATIVE  KETONESUR NEGATIVE  PROTEINUR NEGATIVE  NITRITE NEGATIVE  LEUKOCYTESUR NEGATIVE      Imaging: Dg Chest Port 1 View  08/29/2015  CLINICAL DATA:  New onset fever this morning. EXAM: PORTABLE CHEST 1 VIEW COMPARISON:  The yesterday. FINDINGS: The endotracheal tube tip is at the carina. Left jugular catheter tip in the superior vena cava. Nasogastric tube extending into the stomach. Poor inspiration. Interval small amount of linear density at the right lung base. Decreased ill-defined opacity in the left lower lobe. Probable small bilateral pleural effusions are again noted. Kyphoplasty material in multiple vertebrae. IMPRESSION: 1. Decreased left lower lobe atelectasis. 2. Interval minimal right basilar atelectasis. 3. Stable probable small bilateral pleural effusions. Electronically Signed   By: Beckie SaltsSteven  Reid M.D.   On: 08/29/2015 12:15     Medications:   . fentaNYL infusion INTRAVENOUS 50 mcg/hr (08/30/15 0748)  . insulin (NOVOLIN-R) infusion Stopped (08/30/15 0500)  . norepinephrine Stopped (08/30/15 0600)   . amiodarone  400 mg Oral BID  . antiseptic oral rinse  7 mL Mouth Rinse QID  . aspirin  81 mg Per Tube Daily  . chlorhexidine gluconate  15 mL Mouth Rinse BID  . cholecalciferol  2,000 Units Oral Daily  . clindamycin (CLEOCIN) IV  600 mg Intravenous 3 times per day  . enoxaparin (LOVENOX) injection  40 mg Subcutaneous Q24H  . feeding supplement (VITAL HIGH PROTEIN)  1,000 mL Per Tube Q24H  . ferrous sulfate  325 mg Oral Once per day on Mon Wed Fri   And  . vitamin C  250 mg Oral Once per day on Mon Wed  Fri  . free water  25 mL Per Tube 6 times per day  . hydrocortisone  10 mg Per Tube BID  . levothyroxine  75 mcg Intravenous Daily  . lidocaine  1 patch Transdermal Daily  . pantoprazole sodium  40 mg Per Tube Daily  . polyethylene glycol  17 g Per Tube Daily  . pramipexole  0.25 mg Per Tube TID  . senna-docusate  1 tablet Per  Tube BID  . sodium chloride  1 g Per Tube BID   acetaminophen **OR** acetaminophen, HYDROcodone-acetaminophen, loperamide, magnesium hydroxide, midazolam, ondansetron **OR** ondansetron (ZOFRAN) IV, ondansetron  Assessment/ Plan:  81 y.o. female With history of hypo-thyroidism, hypertension, degenerative joint disease, depression, TIAs, GERD, irritable bowel syndrome, restless leg syndrome, coronary disease, hyponatremia and mild adrenal insufficiency  1. Hyponatremia: baseline sodium of 133 in 8/16. Has been diagnosed with SIADH in the past.  Serum sodium at baseline. Status post tolvaptan times three doses (11/3 - 11/5) - continue to monitor sodium levels and volume status.      2. Hypokalemia: improved. At goal at 3.7 after aggressive replacement.  - agree with aggressive replacement  3. Acute respiratory failure with flash pulmonary edema: acute exacerbation of diastolic congestive heart failure. J96.00. I50.31 Echocardiogram from 11/1 reviewed.  Ventilator assisted.  - appreciate pulmonary input.   4. Adrenal insufficiency: blood pressures now at goal off vasopressors.  - Continues hydrocortisone.     LOS: 7 Roselyn Doby 11/7/20169:27 AM

## 2015-08-30 NOTE — Progress Notes (Signed)
Nutrition Follow-up    INTERVENTION:   EN: if unable to extubate, recommend resuming TF as per order set  NUTRITION DIAGNOSIS:   Inadequate oral intake related to acute illness as evidenced by NPO status, meal completion < 50%. Being addressed via TF  GOAL:   Provide needs based on ASPEN/SCCM guidelines  MONITOR:    (Energy Intake, Anthropometrics, Pulmonary, Digestive System, Electrolyte/Renal Profile)  REASON FOR ASSESSMENT:   LOS, NPO/Clear Liquid Diet, Ventilator, Consult Enteral/tube feeding initiation and management  ASSESSMENT:    Pt on vent this AM, weaning trial this AM, possible extubation  Diet Order:  Diet NPO time specified   EN: TF on hold for possible extubation, previously tolerating Vital High Protein at rate of 45 ml/hr  Digestive System: no signs of TF tolerance, last BM 11/3   Electrolyte and Renal Profile:  Recent Labs Lab 08/28/15 0420  08/29/15 0530 08/29/15 1034 08/29/15 1728 08/29/15 2250 08/30/15 0531  BUN 16  --  13  --   --   --  16  CREATININE 0.82  --  0.52  --   --   --  0.49  NA 130*  < > 132* 132* 130* 131* 133*  K 3.3*  --  3.0*  --   --   --  3.7  MG 1.8  --   --  1.8  --   --   --   PHOS 2.3*  --   --   --   --   --   --   < > = values in this interval not displayed. Glucose Profile:  Recent Labs  08/30/15 0802 08/30/15 0938 08/30/15 1044  GLUCAP 143* 161* 162*   Meds: insulin drip, levophed, sodium chloride tabs, ss novolog  Height:   Ht Readings from Last 1 Encounters:  07/26/2015 5\' 2"  (1.575 m)    Weight:   Wt Readings from Last 1 Encounters:  08/30/15 163 lb 9.3 oz (74.2 kg)    BMI:  Body mass index is 29.91 kg/(m^2).  Estimated Nutritional Needs:   Kcal:  [phone] kcals (11-14 kcals/kg) using wt of 78.6 kg  Protein:  100 g (2.0 g/kg) using IBW 50 kg  Fluid:  1250-1500 mL (25-20 ml/kg)   EDUCATION NEEDS:   No education needs identified at this time  HIGH Care Level  Romelle Starcherate Jennette Leask MS, RD,  LDN [phone] Pager

## 2015-08-30 NOTE — Progress Notes (Addendum)
[facility] [place] [facility]   PATIENT NAME: Laura BureauFrances [name]    MR#:  [id number]  DATE OF BIRTH:  1929/03/08  SUBJECTIVE:  CHIEF COMPLAINT:   Chief Complaint  Patient presents with  . Abdominal Pain    x 2 weeks   -No further episodes of arrhythmias, HR normal, in NSR - alert and following commands - off levophed, urine output normal  REVIEW OF SYSTEMS:  Review of Systems  Unable to perform ROS: intubated    DRUG ALLERGIES:   Allergies  Allergen Reactions  . Strawberry Extract Anaphylaxis    Pt states her throat starts to close.   . Adhesive [Tape] Other (See Comments)    unknown  . Codeine Other (See Comments)    unknown  . Furosemide Other (See Comments)    unknown  . Macrodantin [Nitrofurantoin Macrocrystal] Nausea Only  . Penicillins Hives  . Sulfa Antibiotics Other (See Comments)    unknown  . Valium [Diazepam] Other (See Comments)    Altered mental status  . Ziac [Bisoprolol-Hydrochlorothiazide] Other (See Comments)    unknown    VITALS:  Blood pressure 95/45, pulse 69, temperature 99.2 F (37.3 C), temperature source Oral, resp. rate 15, height 5\' 2"  (1.575 m), weight 74.2 kg (163 lb 9.3 oz), SpO2 100 %.  PHYSICAL EXAMINATION:  Physical Exam  Constitutional: Chronically ill-appearing patient, intubated and sedated HENT:  Head: Normocephalic and atraumatic.  Intubated. Endotracheal tube in proper position  Eyes: Conjunctivae and EOM are normal. Pupils are equal, round, and reactive to light.  Neck: Normal range of motion. Neck supple. No tracheal deviation present. No thyromegaly present.  Cardiovascular: Normal rate, regular rhythm and normal heart sounds.  Pulmonary/Chest: Effort normal and breath sounds normal. No respiratory distress. She has no wheezes. Coarse rhonchi heard on auscultation. Thick respiratory secretions were suctioned.She exhibits no tenderness. Decreased bibasilar breath sounds.  Abdominal: Soft.  Bowel sounds are normal. She exhibits no distension. There is no tenderness.  Musculoskeletal: Normal range of motion.  Neurological: Patient is alert, communicating by writing, gets anxious at times, Following simple commands. Able to move all extremities in bed. No cranial nerve deficit.  Unable to evaluate in detail due to her being on sedation for intubation  Skin: Skin is warm and dry. No rash noted.  Psychiatric: Mood and affect normal. Anxious at times    LABORATORY PANEL:   CBC  Recent Labs Lab 08/30/15 0531  WBC 8.3  HGB 9.6*  HCT 29.4*  PLT 249   ------------------------------------------------------------------------------------------------------------------  Chemistries   Recent Labs Lab 08/26/15 1653  08/29/15 1034  08/30/15 0531  NA 123*  < > 132*  < > 133*  K 3.8  < >  --   --  3.7  CL 91*  < >  --   --  98*  CO2 26  < >  --   --  30  GLUCOSE 109*  < >  --   --  153*  BUN 12  < >  --   --  16  CREATININE 0.51  < >  --   --  0.49  CALCIUM 8.6*  < >  --   --  8.5*  MG  --   < > 1.8  --   --   AST 47*  --   --   --   --   ALT 71*  --   --   --   --   ALKPHOS 65  --   --   --   --  BILITOT 0.5  --   --   --   --   < > = values in this interval not displayed. ------------------------------------------------------------------------------------------------------------------  Cardiac Enzymes  Recent Labs Lab 08/24/15 0513  TROPONINI <0.03   ------------------------------------------------------------------------------------------------------------------  RADIOLOGY:  Dg Chest Port 1 View  08/29/2015  CLINICAL DATA:  New onset fever this morning. EXAM: PORTABLE CHEST 1 VIEW COMPARISON:  The yesterday. FINDINGS: The endotracheal tube tip is at the carina. Left jugular catheter tip in the superior vena cava. Nasogastric tube extending into the stomach. Poor inspiration. Interval small amount of linear density at the right lung base. Decreased ill-defined  opacity in the left lower lobe. Probable small bilateral pleural effusions are again noted. Kyphoplasty material in multiple vertebrae. IMPRESSION: 1. Decreased left lower lobe atelectasis. 2. Interval minimal right basilar atelectasis. 3. Stable probable small bilateral pleural effusions. Electronically Signed   By: Beckie Salts M.D.   On: 08/29/2015 12:15    EKG:   Orders placed or performed during the hospital encounter of 09/06/2015  . ED EKG  . ED EKG  . EKG 12-Lead  . EKG 12-Lead    ASSESSMENT AND PLAN:   81 year old female with past medical history significant for hypertension, diastolic CHF, SIADH, Escherichia coli UTI as outpatient presented to the hospital secondary to worsening weakness and confusion. Noted to have hyponatremia on presentation.  #1 acute on chronic hyponatremia-known history of SIADH. Sodium on admission was 120. -Appreciate nephrology consult. off tolvaptan now- received 3 doses -Sodium has improved. And stable now. - also on salt tablets  #2 acute respiratory failure-intubated for respiratory acidosis. -Remains on ventilator, minimal vent settings at this time. FiO2 of 24%. -Management per pulmonary team. -alert and following commands, possible SBT and wean to extubate per pulmonary team -Echocardiogram gram with normal ejection fraction of 60-65%. Likely has diastolic dysfunction. But elevated pulmonary artery pressures  #3 acute sialoadenitis and recent Escherichia coli UTI. Continue clindamycin. Appreciate ENT evaluation. -Finished ciprofloxacin for UTI - low grade fever yesterday, today temp around 55F, UA normal, sputum cultures ordered - CXR with bibasilar atelectasis  #4 v tach- electrolytes replaced, in nsr now - cardiology f/u - was on amiodarone drip- change to oral amiodarone - recent Echo normal  #5 gastroesophageal reflux disease-also has a large hiatal hernia on chest x-ray. -Continue PPI  #6 history of adrenal insufficiency-continue  oral hydrocortisone. -Per intensivist, no indication for stress dose steroids at this time.  #7 DVT prophylaxis-on Lovenox    All the records are reviewed and case discussed with Care Management/Social Workerr. Management plans discussed with the patient, family and they are in agreement.  CODE STATUS: DO NOT RESUSCITATE  TOTAL CRITICAL CARE TIME SPENT IN TAKING CARE OF THIS PATIENT: 38 minutes.   POSSIBLE D/C IN 3-4 DAYS, DEPENDING ON CLINICAL CONDITION.   Enid Baas M.D on 08/30/2015 at 7:47 AM  Between 7am to 6pm - Pager - [phone]  After 6pm go to [email or web address] - password [email or web address] [facility]  [place] [address] [place] [facility]  Office  [phone]  CC: Primary care physician; Danella Penton., MD

## 2015-08-30 NOTE — Progress Notes (Signed)
   08/30/15 1257  Vent Select  Invasive or Noninvasive Invasive  Adult Vent Y  Adult Ventilator Settings  Vent Type Servo i  Humidity HME  Vent Mode PRVC  Vt Set 500 mL  Set Rate 12 bmp  FiO2 (%) 24 %  I Time 0.8 Sec(s)  PEEP 5 cmH20  Adult Ventilator Measurements  Peak Airway Pressure 32 L/min  Mean Airway Pressure 9 cmH20  Resp Rate Spontaneous 0 br/min  Resp Rate Total 12 br/min  Exhaled Vt 512 mL  Measured Ve 6.2 mL  I:E Ratio Measured 1:5.3  HOB> 30 Degrees Y  Adult Ventilator Alarms  Alarms On Y  Ve High Alarm 20 L/min  Ve Low Alarm 4 L/min  Resp Rate High Alarm 30 br/min  Resp Rate Low Alarm 5  PEEP Low Alarm 2 cmH2O  Press High Alarm 50 cmH2O  Placed patient back on previous settings due to increased periods of apnea.  RN is aware.

## 2015-08-30 NOTE — Progress Notes (Signed)
Inpatient Diabetes Program Recommendations  AACE/ADA: New Consensus Statement on Inpatient Glycemic Control (2015)  Target Ranges:  Prepandial:   less than 140 mg/dL      Peak postprandial:   less than 180 mg/dL (1-2 hours)      Critically ill patients:  140 - 180 mg/dL   Review of Glycemic Control Results for [name], Creasie D (MRN [id number]) as of 08/30/2015 10:21  Results for [name], Monnie D (MRN [id number]) as of 08/30/2015 10:21  Ref. Range 08/30/2015 02:56 08/30/2015 03:55 08/30/2015 04:55 08/30/2015 06:05 08/30/2015 06:59 08/30/2015 07:12 08/30/2015 08:02 08/30/2015 09:38  Glucose-Capillary Latest Ref Range: 65-99 mg/dL 782156 (H) 956147 (H) 213138 (H) 137 (H) 152 (H) 142 (H) 143 (H) 161 (H)   Diabetes history: none noted Outpatient Diabetes medications: none Current orders for Inpatient glycemic control: ICU Glycemic Control Protocol (Phase 2)- currently paused- last blood sugar at 9:38am was 161mg /dl  Inpatient Diabetes Program Recommendations:   Consider transitioning the patient to phase 3 of the ICU Glycemic control order set- Since the patient has not had any insulin for the past 5 hours- consider Novolog 1 unit q4h (hold if tube feeds are stopped)- Does not appear to need any basal insulin.   Susette RacerJulie Virgal [place], RN, BA, MHA, CDE Diabetes Coordinator Inpatient Diabetes Program  [phone] (Team Pager) [phone] [facility] Office) 08/30/2015 10:37 AM

## 2015-08-30 NOTE — Care Management Note (Signed)
Case Management Note  Patient Details  Name: [name] MRN: [id number] Date of Birth: 03-Apr-1929  Subjective/Objective:  Developed respiratory distress and encephalopathy requiring a vent. Now on spontaneous breathing trials. There is hope to extubate patient tomorrow.                   Action/Plan:   Expected Discharge Date:                  Expected Discharge Plan:     In-House Referral:     Discharge planning Services     Post Acute Care Choice:    Choice offered to:     DME Arranged:    DME Agency:     HH Arranged:    HH Agency:     Status of Service:     Medicare Important Message Given:  Yes-second notification given Date Medicare IM Given:    Medicare IM give by:    Date Additional Medicare IM Given:    Additional Medicare Important Message give by:     If discussed at Long Length of Stay Meetings, dates discussed:    Additional Comments:  Marily MemosLisa M Carolene Gitto, RN 08/30/2015, 2:02 PM

## 2015-08-31 ENCOUNTER — Inpatient Hospital Stay: Payer: Medicare Other

## 2015-08-31 DIAGNOSIS — R918 Other nonspecific abnormal finding of lung field: Secondary | ICD-10-CM | POA: Insufficient documentation

## 2015-08-31 DIAGNOSIS — R531 Weakness: Secondary | ICD-10-CM | POA: Insufficient documentation

## 2015-08-31 LAB — CBC
HCT: 28.6 % — ABNORMAL LOW (ref 35.0–47.0)
Hemoglobin: 9.4 g/dL — ABNORMAL LOW (ref 12.0–16.0)
MCH: 30.6 pg (ref 26.0–34.0)
MCHC: 32.8 g/dL (ref 32.0–36.0)
MCV: 93.3 fL (ref 80.0–100.0)
PLATELETS: 254 10*3/uL (ref 150–440)
RBC: 3.07 MIL/uL — AB (ref 3.80–5.20)
RDW: 15.7 % — AB (ref 11.5–14.5)
WBC: 6.9 10*3/uL (ref 3.6–11.0)

## 2015-08-31 LAB — BASIC METABOLIC PANEL
ANION GAP: 4 — AB (ref 5–15)
BUN: 14 mg/dL (ref 6–20)
CALCIUM: 8.8 mg/dL — AB (ref 8.9–10.3)
CO2: 31 mmol/L (ref 22–32)
Chloride: 99 mmol/L — ABNORMAL LOW (ref 101–111)
Creatinine, Ser: 0.36 mg/dL — ABNORMAL LOW (ref 0.44–1.00)
GLUCOSE: 100 mg/dL — AB (ref 65–99)
POTASSIUM: 3.5 mmol/L (ref 3.5–5.1)
Sodium: 134 mmol/L — ABNORMAL LOW (ref 135–145)

## 2015-08-31 LAB — GLUCOSE, CAPILLARY
GLUCOSE-CAPILLARY: 90 mg/dL (ref 65–99)
Glucose-Capillary: 110 mg/dL — ABNORMAL HIGH (ref 65–99)
Glucose-Capillary: 85 mg/dL (ref 65–99)
Glucose-Capillary: 98 mg/dL (ref 65–99)
Glucose-Capillary: 105 mg/dL — ABNORMAL HIGH (ref 65–99)
Glucose-Capillary: 101 mg/dL — ABNORMAL HIGH (ref 65–99)

## 2015-08-31 MED ORDER — ACETYLCYSTEINE 20 % IN SOLN: 3.0000 mL | Freq: Two times a day (BID) | RESPIRATORY_TRACT | Status: DC

## 2015-08-31 MED ORDER — ACETYLCYSTEINE 20 % IN SOLN
3.0000 mL | Freq: Two times a day (BID) | RESPIRATORY_TRACT | Status: DC
  Administered 2015-08-31 – 2015-09-01 (×3): 3 mL via RESPIRATORY_TRACT
  Filled 2015-08-31 (×3): qty 4

## 2015-08-31 MED ORDER — IPRATROPIUM-ALBUTEROL 0.5-2.5 (3) MG/3ML IN SOLN
3.0000 mL | Freq: Four times a day (QID) | RESPIRATORY_TRACT | Status: DC
  Administered 2015-08-31 – 2015-09-01 (×6): 3 mL via RESPIRATORY_TRACT
  Filled 2015-08-31 (×7): qty 3

## 2015-08-31 MED ORDER — SCOPOLAMINE 1 MG/3DAYS TD PT72
1.0000 | MEDICATED_PATCH | TRANSDERMAL | Status: DC
  Administered 2015-08-31: 1.5 mg via TRANSDERMAL
  Filled 2015-08-31: qty 1

## 2015-08-31 NOTE — Progress Notes (Signed)
SBT performed on patient since 1200.  Pt tolerated with no significant dyspnea or instability.  I paged the e-link physician at 1730 to inquire as to whether we would extubate patient or not.  E link physician said to place [facility], so that was completed and fentanyl gtt restarted at 100 mcg/min.  We will reattempt SBT tomorrow morning.  Mucomyst nebs started today and patient is having much more frothy yellow secretions from ett with frequent suctioning needs.  Will continue to monitor closely.

## 2015-08-31 NOTE — Progress Notes (Signed)
Nutrition Follow-up     INTERVENTION:   EN: discussed nutritional poc during ICU rounds; plan to restart TF today if pt unable to be extubated; recommend continuing current regimen as ordered   NUTRITION DIAGNOSIS:   Inadequate oral intake related to acute illness as evidenced by NPO status, meal completion < 50%.   GOAL:   Provide needs based on ASPEN/SCCM guidelines   MONITOR:    (Energy Intake, Anthropometrics, Pulmonary, Digestive System, Electrolyte/Renal Profile)  REASON FOR ASSESSMENT:   LOS, NPO/Clear Liquid Diet, Ventilator, Consult Enteral/tube feeding initiation and management  ASSESSMENT:    Pt remains on vent, weaning trial this AM  Diet Order:  Diet NPO time specified  EN: TF not restarted yesterday after failed weaning attempt, TF order still active  Electrolyte and Renal Profile:  Recent Labs Lab 08/28/15 0420  08/29/15 0530 08/29/15 1034  08/29/15 2250 08/30/15 0531 08/31/15 0507  BUN 16  --  13  --   --   --  16 14  CREATININE 0.82  --  0.52  --   --   --  0.49 0.36*  NA 130*  < > 132* 132*  < > 131* 133* 134*  K 3.3*  --  3.0*  --   --   --  3.7 3.5  MG 1.8  --   --  1.8  --   --   --   --   PHOS 2.3*  --   --   --   --   --   --   --   < > = values in this interval not displayed. Glucose Profile:  Recent Labs  08/31/15 0341 08/31/15 0726 08/31/15 1108  GLUCAP 110* 85 98   Meds: ss novolog   Height:   Ht Readings from Last 1 Encounters:  08/13/2015 5\' 2"  (1.575 m)    Weight:   Wt Readings from Last 1 Encounters:  08/31/15 164 lb 3.9 oz (74.5 kg)    Filed Weights   08/29/15 0822 08/30/15 0500 08/31/15 0500  Weight: 169 lb 1.5 oz (76.7 kg) 163 lb 9.3 oz (74.2 kg) 164 lb 3.9 oz (74.5 kg)    BMI:  Body mass index is 30.03 kg/(m^2).  Estimated Nutritional Needs:   Kcal:  [phone] kcals (11-14 kcals/kg) using wt of 78.6 kg  Protein:  100 g (2.0 g/kg) using IBW 50 kg  Fluid:  1250-1500 mL (25-20 ml/kg)   EDUCATION  NEEDS:   No education needs identified at this time  HIGH Care Level  Romelle Starcherate Shadawn Hanaway MS, RD, LDN [phone] Pager

## 2015-08-31 NOTE — Plan of Care (Signed)
Problem: Phase I Progression Outcomes Goal: VTE prophylaxis Outcome: Progressing SCDs in place Goal: Oral Care per Protocol Outcome: Progressing CHG rinse Goal: Optimized method of communication. Outcome: Progressing Patient able to communicate by writing on paper; nods and answers questions appropriately Goal: Voiding-avoid urinary catheter unless indicated Outcome: Not Progressing Foley in place

## 2015-08-31 NOTE — Progress Notes (Addendum)
[facility] - [place] at [facility]   PATIENT NAME: [name]    MR#:  [id number]  DATE OF BIRTH:  1929/08/11  SUBJECTIVE:  CHIEF COMPLAINT:   Chief Complaint  Patient presents with  . Abdominal Pain    x 2 weeks   -Patient remains on ventilator, alert and following commands. -Failed spontaneous breathing trial yesterday due to tachypnea and respiratory distress -Also having increased thick respiratory secretions since yesterday. Sputum cultures are sent  REVIEW OF SYSTEMS:  Review of Systems  Unable to perform ROS: intubated    DRUG ALLERGIES:   Allergies  Allergen Reactions  . Strawberry Extract Anaphylaxis    Pt states her throat starts to close.   . Adhesive [Tape] Other (See Comments)    unknown  . Codeine Other (See Comments)    unknown  . Furosemide Other (See Comments)    unknown  . Macrodantin [Nitrofurantoin Macrocrystal] Nausea Only  . Penicillins Hives  . Sulfa Antibiotics Other (See Comments)    unknown  . Valium [Diazepam] Other (See Comments)    Altered mental status  . Ziac [Bisoprolol-Hydrochlorothiazide] Other (See Comments)    unknown    VITALS:  Blood pressure 143/127, pulse 66, temperature 98.3 F (36.8 C), temperature source Oral, resp. rate 15, height  (1.575 m), weight 74.5 kg (164 lb 3.9 oz), SpO2 97 %.  PHYSICAL EXAMINATION:  Physical Exam  Constitutional: Chronically ill-appearing patient, intubated and sedated HENT:  Head: Normocephalic and atraumatic.  Intubated. Endotracheal tube in proper position  Eyes: Conjunctivae and EOM are normal. Pupils are equal, round, and reactive to light.  Neck: Normal range of motion. Neck supple. No tracheal deviation present. No thyromegaly present.  Cardiovascular: Normal rate, regular rhythm and normal heart sounds.  Pulmonary/Chest: Effort normal and breath sounds normal. No respiratory distress. She has no wheezes. Coarse rhonchi heard on auscultation.  Thick respiratory secretions were suctioned.She exhibits no tenderness. Decreased bibasilar breath sounds.  Abdominal: Soft. Bowel sounds are normal. She exhibits no distension. There is no tenderness.  Musculoskeletal: Normal range of motion.  Neurological: Patient is alert, communicating by writing, gets anxious at times, Following simple commands. Able to move all extremities in bed. No cranial nerve deficit.  Unable to evaluate in detail due to her being on sedation for intubation  Skin: Skin is warm and dry. No rash noted.  Psychiatric: Mood and affect normal. Anxious at times    LABORATORY PANEL:   CBC  Recent Labs Lab 08/31/15 0507  WBC 6.9  HGB 9.4*  HCT 28.6*  PLT 254   ------------------------------------------------------------------------------------------------------------------  Chemistries   Recent Labs Lab 08/26/15 1653  08/29/15 1034  08/31/15 0507  NA 123*  < > 132*  < > 134*  K 3.8  < >  --   < > 3.5  CL 91*  < >  --   < > 99*  CO2 26  < >  --   < > 31  GLUCOSE 109*  < >  --   < > 100*  BUN 12  < >  --   < > 14  CREATININE 0.51  < >  --   < > 0.36*  CALCIUM 8.6*  < >  --   < > 8.8*  MG  --   < > 1.8  --   --   AST 47*  --   --   --   --   ALT 71*  --   --   --   --  ALKPHOS 65  --   --   --   --   BILITOT 0.5  --   --   --   --   < > = values in this interval not displayed. ------------------------------------------------------------------------------------------------------------------  Cardiac Enzymes No results for input(s): TROPONINI in the last 168 hours. ------------------------------------------------------------------------------------------------------------------  RADIOLOGY:  Dg Chest Port 1 View  08/29/2015  CLINICAL DATA:  New onset fever this morning. EXAM: PORTABLE CHEST 1 VIEW COMPARISON:  The yesterday. FINDINGS: The endotracheal tube tip is at the carina. Left jugular catheter tip in the superior vena cava. Nasogastric tube  extending into the stomach. Poor inspiration. Interval small amount of linear density at the right lung base. Decreased ill-defined opacity in the left lower lobe. Probable small bilateral pleural effusions are again noted. Kyphoplasty material in multiple vertebrae. IMPRESSION: 1. Decreased left lower lobe atelectasis. 2. Interval minimal right basilar atelectasis. 3. Stable probable small bilateral pleural effusions. Electronically Signed   By: Beckie Salts M.D.   On: 08/29/2015 12:15    EKG:   Orders placed or performed during the hospital encounter of 07/28/2015  . ED EKG  . ED EKG  . EKG 12-Lead  . EKG 12-Lead    ASSESSMENT AND PLAN:   81 year old female with past medical history significant for hypertension, diastolic CHF, SIADH, Escherichia coli UTI as outpatient presented to the hospital secondary to worsening weakness and confusion. Noted to have hyponatremia on presentation.  #1 acute on chronic hyponatremia-known history of SIADH. Sodium on admission was 120. -Appreciate nephrology consult. off tolvaptan now- received 3 doses -Sodium has improved. And stable now. - also on salt tablets  #2 acute respiratory failure-intubated for respiratory acidosis. -Remains on ventilator, minimal vent settings at this time. FiO2 of 24%. -Management per pulmonary team. -alert and following commands, possible SBT and wean to extubate per pulmonary team -Echocardiogram gram with normal ejection fraction of 60-65%. Likely has diastolic dysfunction. But elevated pulmonary artery pressures  #3 acute sialoadenitis and recent Escherichia coli UTI. Continue clindamycin. Appreciate ENT evaluation. -Finished ciprofloxacin for UTI - CXR with bibasilar atelectasis -Continue to monitor fever curve and also WBC count. With increased respiratory secretions, sputum cultures are sent for. -We will need oral cavity examination for improvement in her sialoadenitis after extubation. Stop clindamycin after 10  days total  #4 v tach- electrolytes replaced, in nsr now - cardiology f/u - was on amiodarone drip- changed to oral amiodarone. She is on 400 mg by mouth twice a day. After one week, change to 200 mg by mouth twice a day - recent Echo normal  #5 gastroesophageal reflux disease-also has a large hiatal hernia on chest x-ray. -Continue PPI  #6 history of adrenal insufficiency-continue oral hydrocortisone. -no indication for stress dose steroids at this time.  #7 DVT prophylaxis-on Lovenox  #8 hyperglycemia-patient on chronic steroids. On sliding scale insulin at this time. -Off of insulin drip  Updated patient, her son and daughter-in-law at bedside.  All the records are reviewed and case discussed with Care Management/Social Workerr. Management plans discussed with the patient, family and they are in agreement.  CODE STATUS: DO NOT RESUSCITATE  TOTAL CRITICAL CARE TIME SPENT IN TAKING CARE OF THIS PATIENT: 36 minutes.   POSSIBLE D/C IN 3-4 DAYS, DEPENDING ON CLINICAL CONDITION.   Enid Baas M.D on 08/31/2015 at 9:15 AM  Between 7am to 6pm - Pager - [phone]  After 6pm go to [email or web address] - Scientist, research (life sciences) Hospitalists  Office  [phone]  CC: Primary care physician; Danella PentonMILLER,MARK F., MD

## 2015-08-31 NOTE — Progress Notes (Signed)
Subjective:   Sodium 134 (133) (132) K 3.5 Off sedation.  FiO2 24%  Objective:  Vital signs in last 24 hours:  Temp:  [98.3 F (36.8 C)-98.5 F (36.9 C)] 98.3 F (36.8 C) (11/08 0800) Pulse Rate:  [56-108] 66 (11/08 0800) Resp:  [8-24] 15 (11/08 0800) BP: (93-182)/(42-127) 143/127 mmHg (11/08 0800) SpO2:  [96 %-100 %] 97 % (11/08 0800) FiO2 (%):  [24 %] 24 % (11/08 0800) Weight:  [74.5 kg (164 lb 3.9 oz)] 74.5 kg (164 lb 3.9 oz) (11/08 0500)  Weight change: -2.2 kg (-4 lb 13.6 oz) Filed Weights   08/29/15 0822 08/30/15 0500 08/31/15 0500  Weight: 76.7 kg (169 lb 1.5 oz) 74.2 kg (163 lb 9.3 oz) 74.5 kg (164 lb 3.9 oz)    Intake/Output:    Intake/Output Summary (Last 24 hours) at 08/31/15 0854 Last data filed at 08/31/15 0600  Gross per 24 hour  Intake 474.67 ml  Output    525 ml  Net -50.33 ml     Physical Exam: General:  cushingoid appearance, critically ill.   HEENT  ETT  Neck  LIJ central line  Pulm/lungs  Clear ant/lat , vent assisted FiO 24%  CVS/Heart  irregular rhythm, no rub, soft systolic murmur   Abdomen:   obese, soft, nontender, nondistended   Extremities:  + dependent peripheral edema   Neurologic:  lethargic               Basic Metabolic Panel:   Recent Labs Lab 08/27/15 0439  08/28/15 0420  08/29/15 0530 08/29/15 1034 08/29/15 1728 08/29/15 2250 08/30/15 0531 08/31/15 0507  NA 127*  < > 130*  < > 132* 132* 130* 131* 133* 134*  K 3.8  --  3.3*  --  3.0*  --   --   --  3.7 3.5  CL 94*  --  100*  --  99*  --   --   --  98* 99*  CO2 28  --  23  --  30  --   --   --  30 31  GLUCOSE 114*  --  120*  --  196*  --   --   --  153* 100*  BUN 13  --  16  --  13  --   --   --  16 14  CREATININE 0.71  --  0.82  --  0.52  --   --   --  0.49 0.36*  CALCIUM 8.6*  --  8.5*  --  8.4*  --   --   --  8.5* 8.8*  MG  --   --  1.8  --   --  1.8  --   --   --   --   PHOS  --   --  2.3*  --   --   --   --   --   --   --   < > = values in this interval  not displayed.   CBC:  Recent Labs Lab 08/27/15 0439 08/28/15 0420 08/29/15 0530 08/30/15 0531 08/31/15 0507  WBC 5.1 5.9 7.7 8.3 6.9  HGB 11.9* 9.4* 10.5* 9.6* 9.4*  HCT 35.7 28.2* 31.5* 29.4* 28.6*  MCV 94.3 94.0 92.9 93.6 93.3  PLT 305 236 269 249 254      Microbiology:  Recent Results (from the past 720 hour(s))  Urine culture     Status: None   Collection Time: 08/20/15 10:11 PM  Result  Value Ref Range Status   Specimen Description URINE, RANDOM  Final   Special Requests NONE  Final   Culture >=100,000 COLONIES/mL ESCHERICHIA COLI  Final   Report Status 08/02/2015 FINAL  Final   Organism ID, Bacteria ESCHERICHIA COLI  Final      Susceptibility   Escherichia coli - MIC*    AMPICILLIN 4 SENSITIVE Sensitive     CEFTAZIDIME <=1 SENSITIVE Sensitive     CEFAZOLIN <=4 SENSITIVE Sensitive     CEFTRIAXONE <=1 SENSITIVE Sensitive     CIPROFLOXACIN <=0.25 SENSITIVE Sensitive     GENTAMICIN <=1 SENSITIVE Sensitive     IMIPENEM <=0.25 SENSITIVE Sensitive     TRIMETH/SULFA <=20 SENSITIVE Sensitive     PIP/TAZO Value in next row Sensitive      SENSITIVE<=4    * >=100,000 COLONIES/mL ESCHERICHIA COLI  MRSA PCR Screening     Status: None   Collection Time: 08/26/15  4:00 PM  Result Value Ref Range Status   MRSA by PCR NEGATIVE NEGATIVE Final    Comment:        The GeneXpert MRSA Assay (FDA approved for NASAL specimens only), is one component of a comprehensive MRSA colonization surveillance program. It is not intended to diagnose MRSA infection nor to guide or monitor treatment for MRSA infections.   Culture, expectorated sputum-assessment     Status: None   Collection Time: 08/30/15 12:24 PM  Result Value Ref Range Status   Specimen Description THROAT  Final   Special Requests NASOTRACHEAL  Final   Sputum evaluation THIS SPECIMEN IS ACCEPTABLE FOR SPUTUM CULTURE  Final   Report Status 08/30/2015 FINAL  Final    Coagulation Studies: No results for input(s):  LABPROT, INR in the last 72 hours.  Urinalysis:  Recent Labs  08/29/15 1034  COLORURINE YELLOW*  LABSPEC 1.018  PHURINE 5.0  GLUCOSEU NEGATIVE  HGBUR NEGATIVE  BILIRUBINUR NEGATIVE  KETONESUR NEGATIVE  PROTEINUR NEGATIVE  NITRITE NEGATIVE  LEUKOCYTESUR NEGATIVE      Imaging: Dg Chest Port 1 View  08/29/2015  CLINICAL DATA:  New onset fever this morning. EXAM: PORTABLE CHEST 1 VIEW COMPARISON:  The yesterday. FINDINGS: The endotracheal tube tip is at the carina. Left jugular catheter tip in the superior vena cava. Nasogastric tube extending into the stomach. Poor inspiration. Interval small amount of linear density at the right lung base. Decreased ill-defined opacity in the left lower lobe. Probable small bilateral pleural effusions are again noted. Kyphoplasty material in multiple vertebrae. IMPRESSION: 1. Decreased left lower lobe atelectasis. 2. Interval minimal right basilar atelectasis. 3. Stable probable small bilateral pleural effusions. Electronically Signed   By: Beckie Salts M.D.   On: 08/29/2015 12:15     Medications:   . fentaNYL infusion INTRAVENOUS 100 mcg/hr (08/30/15 2030)  . insulin (NOVOLIN-R) infusion Stopped (08/30/15 0500)  . norepinephrine Stopped (08/30/15 0600)   . acidophilus  1 capsule Oral TID  . amiodarone  400 mg Oral BID  . antiseptic oral rinse  7 mL Mouth Rinse QID  . aspirin  81 mg Per Tube Daily  . chlorhexidine gluconate  15 mL Mouth Rinse BID  . cholecalciferol  2,000 Units Oral Daily  . clindamycin (CLEOCIN) IV  600 mg Intravenous 3 times per day  . enoxaparin (LOVENOX) injection  40 mg Subcutaneous Q24H  . feeding supplement (VITAL HIGH PROTEIN)  1,000 mL Per Tube Q24H  . ferrous sulfate  325 mg Oral Once per day on Mon Wed Fri   And  .  vitamin C  250 mg Oral Once per day on Mon Wed Fri  . free water  25 mL Per Tube 6 times per day  . hydrocortisone  10 mg Per Tube BID  . insulin aspart  0-15 Units Subcutaneous 6 times per day  .  levothyroxine  125 mcg Oral QAC breakfast  . lidocaine  1 patch Transdermal Daily  . pantoprazole sodium  40 mg Per Tube Daily  . polyethylene glycol  17 g Per Tube Daily  . pramipexole  0.25 mg Per Tube TID  . senna-docusate  1 tablet Per Tube BID  . sodium chloride  1 g Per Tube BID   acetaminophen **OR** acetaminophen, HYDROcodone-acetaminophen, loperamide, magnesium hydroxide, midazolam, ondansetron **OR** ondansetron (ZOFRAN) IV, ondansetron  Assessment/ Plan:  81 y.o. female With history of hypo-thyroidism, hypertension, degenerative joint disease, depression, TIAs, GERD, irritable bowel syndrome, restless leg syndrome, coronary disease, hyponatremia and mild adrenal insufficiency  1. Hyponatremia: baseline sodium of 133 in 8/16. Has been diagnosed with SIADH in the past.  Serum sodium at baseline. Status post tolvaptan times three doses (11/3 - 11/5) - continue to monitor sodium levels and volume status.      2. Hypokalemia: improved after aggressive replacement.  - agree with aggressive replacement  3. Acute respiratory failure with flash pulmonary edema: acute exacerbation of diastolic congestive heart failure. J96.00. I50.31 Echocardiogram from 11/1 reviewed.  Ventilator assisted.  - appreciate pulmonary input.   4. Adrenal insufficiency: E27.40  blood pressures now at goal off vasopressors.  - Continues hydrocortisone.   Will follow peripherally, please call with questions.     LOS: 8 Ahmaad Neidhardt 11/8/20168:54 AM

## 2015-08-31 NOTE — Consult Note (Signed)
[facility] Consultation     ASSESSMENT/PLAN   The patient is an 81 year old Caucasian female with history significant for adrenal insufficiency, SIADH, as well as obstructive sleep apnea, morbid obesity, chronic hypoventilation. She has had reduced mental status and resp failure in setting of submandibular cellulitis with low sodium levels, she was transferred to the ICU for closer monitoring. Likely related to sepsis/UTI with Subsequent respiratory failure and encephalopathy.   PULMONARY  A/P: Acute hypoxic respiratory failure. Intubated,sedated-will attempt wean today  -Acute on chronic hypercapnic respiratory failure, with chronic hypoventilation. -element of obesity hypoventilation syndrome, with obstructive sleep apnea. -Elevated/paralyzed right diaphragm with likely restrictive lung disease. This may be due to morbid obesity and/or enlarged liver. -Lung nodules, which appear to have been stable for at least the last 6 months. -CT finding suggestive of congestive heart failure and pulmonary hypertension. -Hiatal hernia which may be causing some degree of reflux and aspiration. -Restless leg syndrome. -Pulmonary hypertension, likely secondary to mild LV dysfunction, as well as OSA, and obesity. -will attempt SAT/SBT as tolerated - currently tolerating PSV, if patient continues to do well may anticipate extubation in the next 24 hours   CARDIOVASCULAR  A: LV diastolic dysfunction. Vtach 11/5 -Atrial fibrillation. P:  Continue blood pressure control. -started on amiodarone -monitor lytes  RENAL A: SIADH, currently on tolvaptan, nephrology following. -Adrenal insufficiency, currently on steroid replacement. P:   Continue tolvaptan per nephrology.  GASTROINTESTINAL A:  GERD, hiatal hernia. -History of colitis, history of pancreatitis. P:   Continue PPI   INFECTIOUS A: UTI with Escherichia coli/Sialodinitis. P:  Continue  clindaymycin   ENDOCRINE A: History of adrenal insufficiency, currently on steroid replacement with cushingoid features. P:   Continue steroids, no need for stress dose at this point.  NEUROLOGIC --Altered mental status, likely due to metabolic encephalopathy, and multifactorial from above conditions. We'll continue to monitor.  CT chest from 08/24/15, comparison with 03/24/15; unchanged pulmonary nodules. Moderate to large hiatal hernia. Morbid obesity with bibasilar atelectasis. Enlarged liver with elevated/paralyzed right diaphragm.   Echocardiogram from 08/24/2015: Ejection fraction of 60 to 65%. Pulmonary artery systolic pressure elevated at 51 mmHg. ---------------------------------------  ---------------------------------------   Name: [name] MRN: [id number] DOB: 1928/11/17    ADMISSION DATE:  2015/08/03 CONSULTATION DATE:  08/26/2015  REFERRING MD :  Dr. Sherryll BurgerShah  CHIEF COMPLAINT:  Altered mental status.   SUBJECTIVE: Doing well this morning, more alert and awake, tolerated pressure support a over 5 for about 4 hours and now is tolerating pressure support 505, with some mild respiratory tachypnea. Family updated at bedside REVIEW OF SYSTEMS:   Review of Systems  Constitutional: Negative for fever and chills.  Eyes: Negative for blurred vision and double vision.  Respiratory: Positive for shortness of breath. Negative for cough and wheezing.   Cardiovascular: Negative for chest pain and palpitations.  Gastrointestinal: Negative for heartburn and nausea.  Musculoskeletal: Negative for myalgias.  Skin: Negative for itching.      VITAL SIGNS: Temp:  [98.3 F (36.8 C)-98.7 F (37.1 C)] 98.7 F (37.1 C) (11/08 1200) Pulse Rate:  [56-119] 119 (11/08 1500) Resp:  [12-28] 25 (11/08 1500) BP: (102-182)/(47-127) 141/85 mmHg (11/08 1500) SpO2:  [95 %-100 %] 100 % (11/08 1500) FiO2 (%):  [24 %] 24 % (11/08 1210) Weight:  [164 lb 3.9 oz (74.5 kg)] 164 lb 3.9 oz  (74.5 kg) (11/08 0500) HEMODYNAMICS:   VENTILATOR SETTINGS: Vent Mode:  [-] Spontaneous FiO2 (%):  [24 %] 24 %  Set Rate:  [12 bmp] 12 bmp Vt Set:  [500 mL] 500 mL PEEP:  [5 cmH20] 5 cmH20 Pressure Support:  [8 cmH20] 8 cmH20 Plateau Pressure:  [18 cmH20] 18 cmH20 INTAKE / OUTPUT:  Intake/Output Summary (Last 24 hours) at 08/31/15 1600 Last data filed at 08/31/15 1500  Gross per 24 hour  Intake 365.46 ml  Output    552 ml  Net -186.54 ml    Physical Examination:   VS: BP 141/85 mmHg  Pulse 119  Temp(Src) 98.7 F (37.1 C) (Oral)  Resp 25  Ht  (1.575 m)  Wt 164 lb 3.9 oz (74.5 kg)  BMI 30.03 kg/m2  SpO2 100%  General Appearance: intubated GCS12T  HEENT: PERRLA, EOM intact, no ptosis, no other lesions noticed;  Pulmonary: rhonchi, no wheezing noted CardiovascularNormal S1,S2.  No m/r/g.    Abdomen: Benign, Soft, non-tender, No masses, Renal:  No costovertebral tenderness  GU:  Not performed at this time. Endoc: No evident thyromegaly Skin:   warm, no rashes, no ecchymosis  Extremities: normal, no cyanosis, clubbing, no edema, warm with normal capillary refill.    LABS: Reviewed   LABORATORY PANEL:   CBC  Recent Labs Lab 08/31/15 0507  WBC 6.9  HGB 9.4*  HCT 28.6*  PLT 254    Chemistries   Recent Labs Lab 08/26/15 1653  08/28/15 0420  08/29/15 1034  08/31/15 0507  NA 123*  < > 130*  < > 132*  < > 134*  K 3.8  < > 3.3*  < >  --   < > 3.5  CL 91*  < > 100*  < >  --   < > 99*  CO2 26  < > 23  < >  --   < > 31  GLUCOSE 109*  < > 120*  < >  --   < > 100*  BUN 12  < > 16  < >  --   < > 14  CREATININE 0.51  < > 0.82  < >  --   < > 0.36*  CALCIUM 8.6*  < > 8.5*  < >  --   < > 8.8*  MG  --   < > 1.8  --  1.8  --   --   PHOS  --   --  2.3*  --   --   --   --   AST 47*  --   --   --   --   --   --   ALT 71*  --   --   --   --   --   --   ALKPHOS 65  --   --   --   --   --   --   BILITOT 0.5  --   --   --   --   --   --   < > = values in this  interval not displayed.   Recent Labs Lab 08/30/15 1735 08/30/15 1955 08/30/15 2355 08/31/15 0341 08/31/15 0726 08/31/15 1108  GLUCAP 124* 115* 110* 110* 85 98    Recent Labs Lab 08/27/15 1215 08/27/15 1450 08/27/15 1700  PHART 7.29* 7.53* 7.33*  PCO2ART 53* 25* 45  PO2ART 107 102 85    Recent Labs Lab 08/26/15 1653  AST 47*  ALT 71*  ALKPHOS 65  BILITOT 0.5  ALBUMIN 3.3*    Cardiac Enzymes No results for input(s): TROPONINI in the last 168 hours.  RADIOLOGY:  Dg Chest Port 1 View  08/31/2015  CLINICAL DATA:  Hypoxia EXAM: PORTABLE CHEST 1 VIEW COMPARISON:  August 29, 2015 FINDINGS: Endotracheal tube tip is 1.3 cm above the carina. Nasogastric tube tip and side port are below the diaphragm. Central catheter tip is in the superior vena cava. No pneumothorax. There is persistent atelectasis in the lung bases, slightly more on the left than on the right, with small left effusion. No new opacity. Heart size and pulmonary vascularity are normal. No adenopathy. IMPRESSION: Tube and catheter positions as described without pneumothorax. Mild bibasilar atelectasis. Small left effusion. No airspace consolidation. No change in cardiac silhouette. Electronically Signed   By: Bretta Bang III M.D.   On: 08/31/2015 12:12     I have personally obtained a history, examined the patient, evaluated Pertinent laboratory and [employer]/imaging results, and  formulated the assessment and plan   The Patient requires high complexity decision making for assessment and support, frequent evaluation and titration of therapies, application of advanced monitoring technologies and extensive interpretation of multiple databases. Critical Care Time devoted to patient care services described in this note is 45 minutes.   Overall, patient is critically ill, prognosis is guarded.  Patient with Multiorgan failure and at high risk for cardiac arrest and death.  Will update family when they  arrive   Stephanie Acre, MD [facility] Pager [phone] (please enter 7-digits) On Call Pager - [phone] (please enter 7-digits)

## 2015-08-31 NOTE — Progress Notes (Signed)
Chest xray ordered this am per Dr. Courtney ParisMungal's verbal orders

## 2015-09-01 LAB — BASIC METABOLIC PANEL
ANION GAP: 8 (ref 5–15)
BUN: 13 mg/dL (ref 6–20)
CHLORIDE: 97 mmol/L — AB (ref 101–111)
CO2: 30 mmol/L (ref 22–32)
CREATININE: 0.52 mg/dL (ref 0.44–1.00)
Calcium: 9.1 mg/dL (ref 8.9–10.3)
GLUCOSE: 113 mg/dL — AB (ref 65–99)
POTASSIUM: 3.5 mmol/L (ref 3.5–5.1)
Sodium: 135 mmol/L (ref 135–145)

## 2015-09-01 LAB — GLUCOSE, CAPILLARY
GLUCOSE-CAPILLARY: 101 mg/dL — AB (ref 65–99)
GLUCOSE-CAPILLARY: 117 mg/dL — AB (ref 65–99)
GLUCOSE-CAPILLARY: 261 mg/dL — AB (ref 65–99)
Glucose-Capillary: 102 mg/dL — ABNORMAL HIGH (ref 65–99)

## 2015-09-01 LAB — CBC
HEMATOCRIT: 27.7 % — AB (ref 35.0–47.0)
HEMOGLOBIN: 9.1 g/dL — AB (ref 12.0–16.0)
MCH: 30.9 pg (ref 26.0–34.0)
MCHC: 32.8 g/dL (ref 32.0–36.0)
MCV: 93.9 fL (ref 80.0–100.0)
Platelets: 285 10*3/uL (ref 150–440)
RBC: 2.95 MIL/uL — ABNORMAL LOW (ref 3.80–5.20)
RDW: 15.6 % — AB (ref 11.5–14.5)
WBC: 6.2 10*3/uL (ref 3.6–11.0)

## 2015-09-01 MED ORDER — MORPHINE SULFATE (PF) 4 MG/ML IV SOLN
4.0000 mg | Freq: Once | INTRAVENOUS | Status: AC
  Administered 2015-09-01: 4 mg via INTRAVENOUS

## 2015-09-01 MED ORDER — MORPHINE SULFATE (PF) 2 MG/ML IV SOLN
1.0000 mg | Freq: Once | INTRAVENOUS | Status: AC
  Administered 2015-09-01: 1 mg via INTRAVENOUS
  Filled 2015-09-01: qty 1

## 2015-09-01 MED ORDER — MORPHINE SULFATE (PF) 4 MG/ML IV SOLN
INTRAVENOUS | Status: AC
  Administered 2015-09-01: 4 mg via INTRAVENOUS
  Filled 2015-09-01: qty 1

## 2015-09-01 MED ORDER — METHYLPREDNISOLONE SODIUM SUCC 125 MG IJ SOLR
60.0000 mg | Freq: Once | INTRAMUSCULAR | Status: AC
  Administered 2015-09-01: 60 mg via INTRAVENOUS
  Filled 2015-09-01: qty 2

## 2015-09-01 MED ORDER — LABETALOL HCL 5 MG/ML IV SOLN
20.0000 mg | Freq: Once | INTRAVENOUS | Status: AC
  Administered 2015-09-01: 20 mg via INTRAVENOUS
  Filled 2015-09-01: qty 4

## 2015-09-01 MED ORDER — SODIUM CHLORIDE 0.9 % IV BOLUS (SEPSIS)
500.0000 mL | Freq: Once | INTRAVENOUS | Status: AC
  Administered 2015-09-01: 500 mL via INTRAVENOUS

## 2015-09-02 LAB — BLOOD GAS, ARTERIAL
ALLENS TEST (PASS/FAIL): POSITIVE — AB
Acid-base deficit: 3.1 mmol/L — ABNORMAL HIGH (ref 0.0–2.0)
Bicarbonate: 28.6 mEq/L — ABNORMAL HIGH (ref 21.0–28.0)
Delivery systems: POSITIVE
Expiratory PAP: 6
FIO2: 0.8
INSPIRATORY PAP: 16
O2 Saturation: 89.3 %
PATIENT TEMPERATURE: 37
PO2 ART: 77 mmHg — AB (ref 83.0–108.0)
RATE: 16 resp/min
pCO2 arterial: 92 mmHg (ref 32.0–48.0)
pH, Arterial: 7.1 — CL (ref 7.350–7.450)

## 2015-09-02 LAB — CULTURE, RESPIRATORY W GRAM STAIN

## 2015-09-02 LAB — CULTURE, RESPIRATORY

## 2015-09-23 NOTE — Progress Notes (Signed)
Patient extubated around 11:15 AM. Titrated to high flow tolerating well. Patients voice is hoarse. Treatment prior to extubation breathing treatment and steriod. Family at bedside.

## 2015-09-23 NOTE — Care Management Note (Addendum)
Case Management Note  Patient Details  Name: [name] MRN: [id number] Date of Birth: 1929-08-18  Subjective/Objective:   Extubated this morning. Tolerating well.                 Action/Plan: Continuing to follow progression.   Expected Discharge Date:                  Expected Discharge Plan:     In-House Referral:     Discharge planning Services     Post Acute Care Choice:    Choice offered to:     DME Arranged:    DME Agency:     HH Arranged:    HH Agency:     Status of Service:     Medicare Important Message Given:  Yes-second notification given Date Medicare IM Given:    Medicare IM give by:    Date Additional Medicare IM Given:    Additional Medicare Important Message give by:     If discussed at Long Length of Stay Meetings, dates discussed:    Additional Comments:  Marily MemosLisa M Jamarkus Lisbon, RN 08/30/2015, 2:08 PM

## 2015-09-23 NOTE — Progress Notes (Signed)
Pink tinged secretions noted this am in ET tube, Respiratory consulted.  Likely irritation from frequent suctioning due to moderate thick secretions. Will continue to monitor.

## 2015-09-23 NOTE — Progress Notes (Signed)
   09/03/2015 1620  Clinical Encounter Type  Visited With Patient and family together  Visit Type Initial;Patient actively dying  Referral From Nurse  Consult/Referral To Chaplain  Spiritual Encounters  Spiritual Needs Prayer;Grief support  Stress Factors  Family Stress Factors Loss  Answered page. Pt actively dying. Provided pastoral presence for family. Konrad Pentahap Rick Jayzen Paver

## 2015-09-23 NOTE — Discharge Summary (Signed)
[facility] [place] [facility]   PATIENT NAME: [name]    MR#:  [id number]  DATE OF BIRTH:  14-Dec-1928  DATE OF ADMISSION:  2015-09-20 ADMITTING PHYSICIAN: Ramonita LabAruna Omair Dettmer, MD  DATE OF DISCHARGE: 08/29/2015  5:41 PM  PRIMARY CARE PHYSICIAN: Danella PentonMILLER,MARK F., MD    ADMISSION DIAGNOSIS:  Hyponatremia [E87.1] Neck swelling [R22.1] Generalized weakness [R53.1] Swelling of face [R22.0]  DISCHARGE DIAGNOSIS:  Deceased 08/29/2015 at 17:41 SECONDARY DIAGNOSIS:   Past Medical History  Diagnosis Date  . Asthma   . Hypertension   . Arthritis   . Seizures [facility](HCC)     HOSPITAL COURSE:   81 year old female with past medical history significant for hypertension, diastolic CHF, SIADH, Escherichia coli UTI as outpatient presented to the hospital secondary to worsening weakness and confusion. Noted to have hyponatremia on presentation.  acute respiratory failure-extubated 11/9 - on bipap  Pt was still sob though extubatedduring my exam -pt became more sob and hypotensive in the evening. Dr.Mungal called and d/w pts son, pt was made DNR.  -eventually pt was bradycardic and deceased at 17:41 per RN's report Pt was pronounced and family members notified  -Echocardiogram gram with normal ejection fraction of 60-65%. Likely has diastolic dysfunction. But elevated pulmonary artery pressure    DISCHARGE CONDITIONS:  Deceased   CONSULTS OBTAINED:  Treatment Team:  Ramonita LabAruna Odessie Polzin, MD Mosetta PigeonHarmeet Singh, MD Linus Salmonshapman McQueen, MD Lamar BlinksBruce J Kowalski, MD Abby Lanetta Inchubman Abisogun, MD Shane CrutchPradeep Ramachandran, MD Pauletta BrownsYuriy Zeylikman, MD   PROCEDURES none  Time spent on f/u - 35 min

## 2015-09-23 NOTE — Progress Notes (Signed)
Update: Patient extubated to high flow nasal cannula, still with some mild respiratory distress. Holding saturations greater than 88%, received DuoNeb's 1, received Solu-Medrol 40 mg 1. Will continue to monitor respiratory status.  Stephanie AcreVishal Tabitha Riggins, MD [facility] Pager [phone]- [phone] (please enter 7-digits) On Call Pager - [phone] (please enter 7-digits)

## 2015-09-23 NOTE — Progress Notes (Signed)
[facility] [place] at [facility]   PATIENT NAME: [name]    MR#:  [id number]  DATE OF BIRTH:  1928-12-11  SUBJECTIVE:  CHIEF COMPLAINT:   Chief Complaint  Patient presents with  . Abdominal Pain    x 2 weeks   -Patient is extubated today but still short of breath and was eventually placed on BiPAP    REVIEW OF SYSTEMS:  Review of Systems  Constitutional: Negative for chills and diaphoresis.  HENT: Negative for ear pain and nosebleeds.   Eyes: Negative for double vision and photophobia.  Respiratory: Positive for cough and shortness of breath.   Cardiovascular: Negative for chest pain.  Gastrointestinal: Negative for nausea and vomiting.   Limited review of system as the patient is still short of breath  DRUG ALLERGIES:   Allergies  Allergen Reactions  . Strawberry Extract Anaphylaxis    Pt states her throat starts to close.   . Adhesive [Tape] Other (See Comments)    unknown  . Codeine Other (See Comments)    unknown  . Furosemide Other (See Comments)    unknown  . Macrodantin [Nitrofurantoin Macrocrystal] Nausea Only  . Penicillins Hives  . Sulfa Antibiotics Other (See Comments)    unknown  . Valium [Diazepam] Other (See Comments)    Altered mental status  . Ziac [Bisoprolol-Hydrochlorothiazide] Other (See Comments)    unknown    VITALS:  Blood pressure 182/71, pulse 125, temperature 98.3 F (36.8 C), temperature source Axillary, resp. rate 19, height  (1.575 m), weight 77.6 kg (171 lb 1.2 oz), SpO2 100 %.  PHYSICAL EXAMINATION:  Physical Exam  Constitutional: Extubated HENT:  Head: Normocephalic and atraumatic.  Eyes: Conjunctivae and EOM are normal. Pupils are equal, round, and reactive to light.  Neck: Normal range of motion. Neck supple. No tracheal deviation present. No thyromegaly present.  Cardiovascular: Normal rate, regular rhythm and normal heart sounds.  Pulmonary/Chest: Short of breath, using accessory  muscles and coarse breath sounds . She has no wheezes. Coarse rhonchi heard on auscultation. Thick respiratory secretions were suctioned.She exhibits no tenderness. Decreased bibasilar breath sounds.  Abdominal: Soft. Bowel sounds are normal. She exhibits no distension. There is no tenderness.  Musculoskeletal: Normal range of motion.  Neurological: Patient is alert, oriented 3 , but short of breath with communication, Following  commands. Able to move all extremities spontaneously in bed. No cranial nerve deficit.  Skin: Skin is warm and dry. No rash noted.  Psychiatric: Mood and affect normal. Anxious at times    LABORATORY PANEL:   CBC  Recent Labs Lab 09/11/2015 0447  WBC 6.2  HGB 9.1*  HCT 27.7*  PLT 285   ------------------------------------------------------------------------------------------------------------------  Chemistries   Recent Labs Lab 08/26/15 1653  08/29/15 1034  09/10/2015 0447  NA 123*  < > 132*  < > 135  K 3.8  < >  --   < > 3.5  CL 91*  < >  --   < > 97*  CO2 26  < >  --   < > 30  GLUCOSE 109*  < >  --   < > 113*  BUN 12  < >  --   < > 13  CREATININE 0.51  < >  --   < > 0.52  CALCIUM 8.6*  < >  --   < > 9.1  MG  --   < > 1.8  --   --   AST 47*  --   --   --   --  ALT 71*  --   --   --   --   ALKPHOS 65  --   --   --   --   BILITOT 0.5  --   --   --   --   < > = values in this interval not displayed. ------------------------------------------------------------------------------------------------------------------  Cardiac Enzymes No results for input(s): TROPONINI in the last 168 hours. ------------------------------------------------------------------------------------------------------------------  RADIOLOGY:  Dg Chest Port 1 View  08/31/2015  CLINICAL DATA:  Hypoxia EXAM: PORTABLE CHEST 1 VIEW COMPARISON:  August 29, 2015 FINDINGS: Endotracheal tube tip is 1.3 cm above the carina. Nasogastric tube tip and side port are below the diaphragm.  Central catheter tip is in the superior vena cava. No pneumothorax. There is persistent atelectasis in the lung bases, slightly more on the left than on the right, with small left effusion. No new opacity. Heart size and pulmonary vascularity are normal. No adenopathy. IMPRESSION: Tube and catheter positions as described without pneumothorax. Mild bibasilar atelectasis. Small left effusion. No airspace consolidation. No change in cardiac silhouette. Electronically Signed   By: Bretta Bang III M.D.   On: 08/31/2015 12:12    EKG:   Orders placed or performed during the hospital encounter of 08/12/2015  . ED EKG  . ED EKG  . EKG 12-Lead  . EKG 12-Lead    ASSESSMENT AND PLAN:   81 year old female with past medical history significant for hypertension, diastolic CHF, SIADH, Escherichia coli UTI as outpatient presented to the hospital secondary to worsening weakness and confusion. Noted to have hyponatremia on presentation.  #1 acute on chronic hyponatremia-known history of SIADH. Sodium on admission was 120. -Appreciate nephrology consult. off tolvaptan now- received 3 doses -Sodium has improved. And stable now. - also on salt tablets  #2 acute respiratory failure-extubated today . - on bipap  -Management per pulmonary team. -alert and following commands -Echocardiogram gram with normal ejection fraction of 60-65%. Likely has diastolic dysfunction. But elevated pulmonary artery pressures  #3 acute sialoadenitis and recent Escherichia coli UTI. Continue clindamycin. Appreciate ENT evaluation. -Finished ciprofloxacin for UTI -Continue to monitor fever curve and also WBC count. With increased respiratory secretions, sputum cultures are sent for. -We will need oral cavity examination for improvement in her sialoadenitis. Stop clindamycin after 10 days total  #4 v tach- electrolytes replaced, in nsr now - cardiology f/u - was on amiodarone drip- changed to oral amiodarone. She is on 400  mg by mouth twice a day. After one week, change to 200 mg by mouth twice a day - recent Echo normal  #5 gastroesophageal reflux disease-also has a large hiatal hernia on chest x-ray. -Continue PPI  #6 history of adrenal insufficiency-continue oral hydrocortisone. -no indication for stress dose steroids at this time.  #7 DVT prophylaxis-on Lovenox  #8 hyperglycemia-patient on chronic steroids. On sliding scale insulin at this time. -Off of insulin drip  Updated patient, her son and daughter-in-law at bedside.  All the records are reviewed and case discussed with Care Management/Social Workerr. Management plans discussed with the patient, family and they are in agreement.  CODE STATUS: DO NOT RESUSCITATE  TOTAL CRITICAL CARE TIME SPENT IN TAKING CARE OF THIS PATIENT: 35  minutes.   POSSIBLE D/C IN 3-4 DAYS, DEPENDING ON CLINICAL CONDITION.   Ramonita Lab M.D on Sep 05, 2015 at 4:49 PM  Between 7am to 6pm - Pager - [phone]  After 6pm go to [email or web address] - password [email or web address] [facility]  [facility]  Office  [phone]  CC:  Primary care physician; Danella PentonMILLER,MARK F., MD

## 2015-09-23 NOTE — Progress Notes (Signed)
[facility] FOR AM LAB REPLACEMENT ONLY  The patient does not apply for the [facility] Electrolyte Re[facility] Protocol based on the criteria listed below:     Is urine output >/= 0.5 ml/kg/hr for the last 6 hours? No. Patient's UOP is 0.3 ml/kg/hr   Abnormal electrolyte(s):K3.5   If a panic level lab has been reported, has the CCM MD in charge been notified? Yes.  .   Physician:  Lavinia SharpsM Ramaswamy, MD  [place] NakayamaChisholm, Mada Sadik William 09/15/2015 6:29 AM

## 2015-09-23 NOTE — Progress Notes (Signed)
Patient given labetalol and morphine. BP dropped bolus ordered.arterial blood gases results called to Dr Dema SeverinMungal, he will be contacting family

## 2015-09-23 NOTE — Progress Notes (Signed)
Patients BP elevated, patient still has labored breathing after being placed on Bipap. Per Dr. Dema SeverinMungal will order ABG and enter a PRN for blood pressure. He is also contacting son regarding patients condition.

## 2015-09-23 NOTE — Progress Notes (Signed)
Nutrition Follow-up   INTERVENTION:   Meals and Snacks: await diet progression post extubation  NUTRITION DIAGNOSIS:   Inadequate oral intake related to acute illness as evidenced by NPO status, meal completion < 50%.  GOAL:   Provide needs based on ASPEN/SCCM guidelines  MONITOR:    (Energy Intake, Anthropometrics, Pulmonary, Digestive System, Electrolyte/Renal Profile)  REASON FOR ASSESSMENT:   LOS, NPO/Clear Liquid Diet, Ventilator, Consult Enteral/tube feeding initiation and management  ASSESSMENT:     Pt s/p extubation, currently on HFNC  Diet Order:  Diet NPO time specified   EN: TF held for extubation  Electrolyte and Renal Profile:  Recent Labs Lab 08/28/15 0420  08/29/15 1034  08/30/15 0531 08/31/15 0507 09/14/2015 0447  BUN 16  < >  --   --  16 14 13   CREATININE 0.82  < >  --   --  0.49 0.36* 0.52  NA 130*  < > 132*  < > 133* 134* 135  K 3.3*  < >  --   --  3.7 3.5 3.5  MG 1.8  --  1.8  --   --   --   --   PHOS 2.3*  --   --   --   --   --   --   < > = values in this interval not displayed. Glucose Profile:  Recent Labs  08/28/2015 0442 09/05/2015 0714 08/29/2015 1117  GLUCAP 101* 102* 117*   Meds: ss novolog, solumdrol, acidophilus  Height:   Ht Readings from Last 1 Encounters:  April 27, 2015 5\' 2"  (1.575 m)    Weight:   Wt Readings from Last 1 Encounters:  08/26/2015 171 lb 1.2 oz (77.6 kg)    Filed Weights   08/30/15 0500 08/31/15 0500 09/15/2015 0439  Weight: 163 lb 9.3 oz (74.2 kg) 164 lb 3.9 oz (74.5 kg) 171 lb 1.2 oz (77.6 kg)    BMI:  Body mass index is 31.28 kg/(m^2).  Estimated Nutritional Needs:   Kcal:  [phone] kcals (11-14 kcals/kg) using wt of 78.6 kg  Protein:  100 g (2.0 g/kg) using IBW 50 kg  Fluid:  1250-1500 mL (25-20 ml/kg)   EDUCATION NEEDS:   No education needs identified at this time  HIGH Care Level  Romelle Starcherate Chalyn Amescua MS, RD, LDN [phone] Pager

## 2015-09-23 NOTE — Clinical Social Work Note (Signed)
Patient remains on ventilator at this time. CSW continues to follow and will offer support to family as needed. [facility] and will possibly need a higher level of care once able to wean from vent. York SpanielMonica Darion Milewski MSW,LCSW [phone]

## 2015-09-23 NOTE — Consult Note (Signed)
[facility] Consultation     ASSESSMENT/PLAN   The patient is an 81 year old Caucasian female with history significant for adrenal insufficiency, SIADH, as well as obstructive sleep apnea, morbid obesity, chronic hypoventilation. She has had reduced mental status and resp failure in setting of submandibular cellulitis with low sodium levels, she was transferred to the ICU for closer monitoring. Likely related to sepsis/UTI with Subsequent respiratory failure and encephalopathy - now with significant improvement    PULMONARY  A/P: Acute hypoxic respiratory failure. Intubated,sedated-will attempt extubation today -Acute on chronic hypercapnic respiratory failure, with chronic hypoventilation.- attempt extubation today -element of obesity hypoventilation syndrome, with obstructive sleep apnea. -Elevated/paralyzed right diaphragm with likely restrictive lung disease. This may be due to morbid obesity and/or enlarged liver. -Lung nodules, which appear to have been stable for at least the last 6 months. -CT finding suggestive of congestive heart failure and pulmonary hypertension. -Hiatal hernia which may be causing some degree of reflux and aspiration. -Restless leg syndrome. -Pulmonary hypertension, likely secondary to mild LV dysfunction, as well as OSA, and obesity. -currently tolerating PSV, if patient continues to do well may anticipate extubation today -start weaning cortef - give 1 dose solumedrol prior to extubation    CARDIOVASCULAR  A: LV diastolic dysfunction. Vtach 11/5 -Atrial fibrillation. P:  Continue blood pressure control. -started on amiodarone -monitor lytes  RENAL A: SIADH, currently on tolvaptan, nephrology following. -Adrenal insufficiency, currently on steroid replacement. P:   Continue tolvaptan per nephrology.  GASTROINTESTINAL A:  GERD, hiatal hernia. -History of colitis, history of pancreatitis. P:   Continue  PPI   INFECTIOUS A: UTI with Escherichia coli/Sialodinitis. P:  Continue clindaymycin   ENDOCRINE A: History of adrenal insufficiency, currently on steroid replacement with cushingoid features. P:   Continue steroids, no need for stress dose at this point.  NEUROLOGIC --Altered mental status, likely due to metabolic encephalopathy, and multifactorial from above conditions. We'll continue to monitor.  CT chest from 08/24/15, comparison with 03/24/15; unchanged pulmonary nodules. Moderate to large hiatal hernia. Morbid obesity with bibasilar atelectasis. Enlarged liver with elevated/paralyzed right diaphragm.   Echocardiogram from 08/24/2015: Ejection fraction of 60 to 65%. Pulmonary artery systolic pressure elevated at 51 mmHg. ---------------------------------------  ---------------------------------------   Name: [name] MRN: [id number] DOB: Mar 06, 1929    ADMISSION DATE:  10/14/2015 CONSULTATION DATE:  08/26/2015  REFERRING MD :  Dr. Sherryll BurgerShah  CHIEF COMPLAINT:  Altered mental status.   SUBJECTIVE: Doing well this morning, more alert and awake, tolerated pressure support a over 5 for about 4 hours and now is tolerating pressure support 5/5, with some mild respiratory tachypnea. Plan for extubation today.   Family updated at bedside REVIEW OF SYSTEMS:   Review of Systems  Constitutional: Negative for fever and chills.  Eyes: Negative for blurred vision and double vision.  Respiratory: Positive for shortness of breath. Negative for cough and wheezing.   Cardiovascular: Negative for chest pain and palpitations.  Gastrointestinal: Negative for heartburn and nausea.  Musculoskeletal: Negative for myalgias.  Skin: Negative for itching.      VITAL SIGNS: Temp:  [98.1 F (36.7 C)-99.2 F (37.3 C)] 98.1 F (36.7 C) (11/09 0700) Pulse Rate:  [60-119] 76 (11/09 0700) Resp:  [11-28] 12 (11/09 0700) BP: (108-149)/(44-113) 118/47 mmHg (11/09 0700) SpO2:  [91 %-100 %] 100  % (11/09 0700) FiO2 (%):  [24 %] 24 % (11/09 0754) Weight:  [171 lb 1.2 oz (77.6 kg)] 171 lb 1.2 oz (77.6 kg) (11/09 0439) HEMODYNAMICS:  VENTILATOR SETTINGS: Vent Mode:  [-] Spontaneous FiO2 (%):  [24 %] 24 % Set Rate:  [12 bmp] 12 bmp Vt Set:  [500 mL] 500 mL PEEP:  [5 cmH20] 5 cmH20 Pressure Support:  [5 cmH20-10 cmH20] 10 cmH20 Plateau Pressure:  [18 cmH20-22 cmH20] 20 cmH20 INTAKE / OUTPUT:  Intake/Output Summary (Last 24 hours) at 09/13/2015 0855 Last data filed at 08/27/2015 0517  Gross per 24 hour  Intake 528.09 ml  Output    153 ml  Net 375.09 ml    Physical Examination:   VS: BP 118/47 mmHg  Pulse 76  Temp(Src) 98.1 F (36.7 C) (Axillary)  Resp 12  Ht  (1.575 m)  Wt 171 lb 1.2 oz (77.6 kg)  BMI 31.28 kg/m2  SpO2 100%  General Appearance: intubated GCS12T  HEENT: PERRLA, EOM intact, no ptosis, no other lesions noticed;  Pulmonary: rhonchi, no wheezing noted CardiovascularNormal S1,S2.  No m/r/g.    Abdomen: Benign, Soft, non-tender, No masses, Renal:  No costovertebral tenderness  GU:  Not performed at this time. Endoc: No evident thyromegaly Skin:   warm, no rashes, no ecchymosis  Extremities: normal, no cyanosis, clubbing, no edema, warm with normal capillary refill.    LABS: Reviewed   LABORATORY PANEL:   CBC  Recent Labs Lab 09/05/2015 0447  WBC 6.2  HGB 9.1*  HCT 27.7*  PLT 285    Chemistries   Recent Labs Lab 08/26/15 1653  08/28/15 0420  08/29/15 1034  09/21/2015 0447  NA 123*  < > 130*  < > 132*  < > 135  K 3.8  < > 3.3*  < >  --   < > 3.5  CL 91*  < > 100*  < >  --   < > 97*  CO2 26  < > 23  < >  --   < > 30  GLUCOSE 109*  < > 120*  < >  --   < > 113*  BUN 12  < > 16  < >  --   < > 13  CREATININE 0.51  < > 0.82  < >  --   < > 0.52  CALCIUM 8.6*  < > 8.5*  < >  --   < > 9.1  MG  --   < > 1.8  --  1.8  --   --   PHOS  --   --  2.3*  --   --   --   --   AST 47*  --   --   --   --   --   --   ALT 71*  --   --   --   --   --    --   ALKPHOS 65  --   --   --   --   --   --   BILITOT 0.5  --   --   --   --   --   --   < > = values in this interval not displayed.   Recent Labs Lab 08/31/15 1108 08/31/15 1613 08/31/15 1948 08/31/15 2328 08/26/2015 0442 09/03/2015 0714  GLUCAP 98 105* 90 101* 101* 102*    Recent Labs Lab 08/27/15 1215 08/27/15 1450 08/27/15 1700  PHART 7.29* 7.53* 7.33*  PCO2ART 53* 25* 45  PO2ART 107 102 85    Recent Labs Lab 08/26/15 1653  AST 47*  ALT 71*  ALKPHOS 65  BILITOT 0.5  ALBUMIN  3.3*    Cardiac Enzymes No results for input(s): TROPONINI in the last 168 hours.  RADIOLOGY:  Dg Chest Port 1 View  08/31/2015  CLINICAL DATA:  Hypoxia EXAM: PORTABLE CHEST 1 VIEW COMPARISON:  August 29, 2015 FINDINGS: Endotracheal tube tip is 1.3 cm above the carina. Nasogastric tube tip and side port are below the diaphragm. Central catheter tip is in the superior vena cava. No pneumothorax. There is persistent atelectasis in the lung bases, slightly more on the left than on the right, with small left effusion. No new opacity. Heart size and pulmonary vascularity are normal. No adenopathy. IMPRESSION: Tube and catheter positions as described without pneumothorax. Mild bibasilar atelectasis. Small left effusion. No airspace consolidation. No change in cardiac silhouette. Electronically Signed   By: Bretta Bang III M.D.   On: 08/31/2015 12:12     I have personally obtained a history, examined the patient, evaluated Pertinent laboratory and RadioGraphic/imaging results, and  formulated the assessment and plan   The Patient requires high complexity decision making for assessment and support, frequent evaluation and titration of therapies, application of advanced monitoring technologies and extensive interpretation of multiple databases. Critical Care Time devoted to patient care services described in this note is 45 minutes.   Overall, patient is critically ill, prognosis is guarded.   Patient with Multiorgan failure and at high risk for cardiac arrest and death.  Will update family when they arrive   Stephanie Acre, MD [place] [facility] Pager [phone] (please enter 7-digits) On Call Pager - [phone] (please enter 7-digits)

## 2015-09-23 NOTE — Progress Notes (Signed)
Patient passed at 17:41. Family notified. Supervisor, [facility] notified. Dr Dema SeverinMungal at patients bedside. Paged Dr Amado CoeGouru. Per Luna KitchensLatasha from [facility] patient is not a donor- [phone]. Awaiting for Son who is POA to arrive at hospital. Florentina AddisonKatie C and myself pronounced patient at 17:41. No respirations, no pulse, no lung sounds heard over auscultation. ECG asystole. Dr Dema SeverinMungal spoke with son as patient was declining.

## 2015-09-23 NOTE — Progress Notes (Signed)
Update: Patient with mild respiratory distress, will use of accessory muscles. Has mild level of anxiety. I have called and spoken to both sons, updated them on her clinical status, which is worsening from a respiratory standpoint. SBPs in the 180s Both patient and her son stated that she is a DO NOT RESUSCITATE.   Plan 1. Morphine 1mg  x 1 IV 2. Labetalol 20mg  IV x 1 3. ABG 4. DNR    Stephanie AcreVishal Marcelis Wissner, MD [place] [facility] Pager [phone] (please enter 7-digits) On Call Pager - [phone] (please enter 7-digits)

## 2015-09-23 DEATH — deceased

## 2015-11-24 IMAGING — CT CT CHEST W/ CM
1 of 2 series · 14 of 32 positions shown, 18 images · IV contrast (omnipaque)
Comparison: CT abdomen and pelvis 11/15/2014 and 11/24/2014.

CLINICAL DATA: Pulmonary nodule

EXAM:
CT CHEST WITH CONTRAST
TECHNIQUE: Multidetector CT imaging of the chest was performed during
intravenous contrast administration.
CONTRAST:  75mL OMNIPAQUE IOHEXOL 300 MG/ML  SOLN

[Series 2: routine chest with · axial · 0.67mm/px · z∈[+38,+258]mm · 14 of 54 slices shown, 18 images]
[im 5/54  mediastinal]
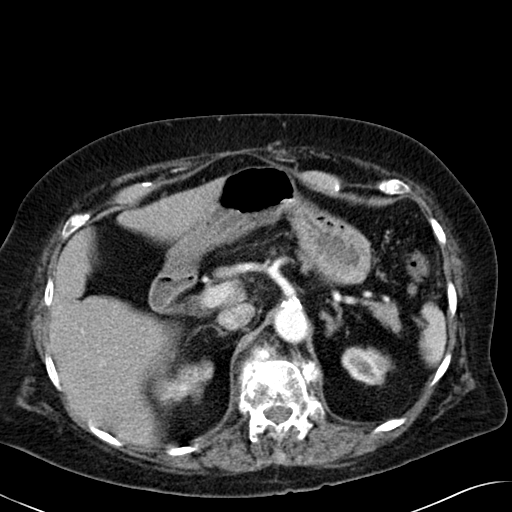
[im 5/54  lung]
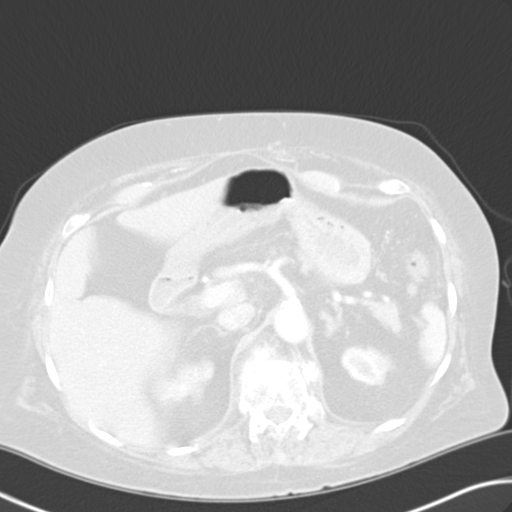
[im 9/54  lung]
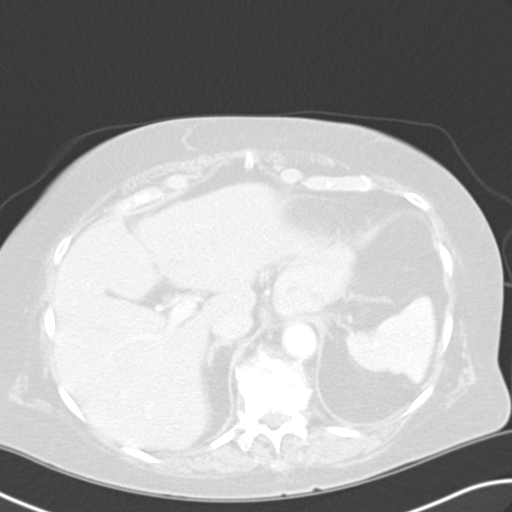
[im 13/54  lung]
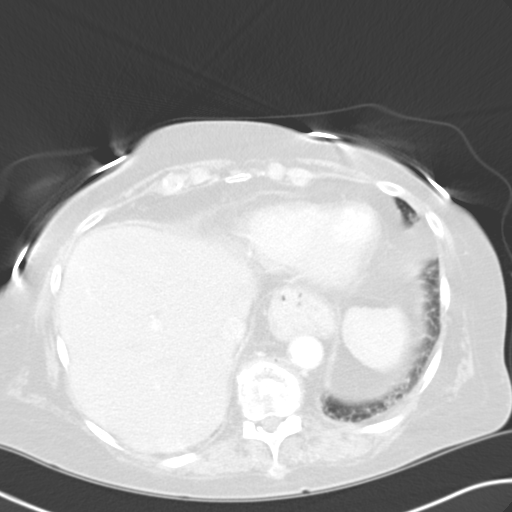
[im 17/54  lung]
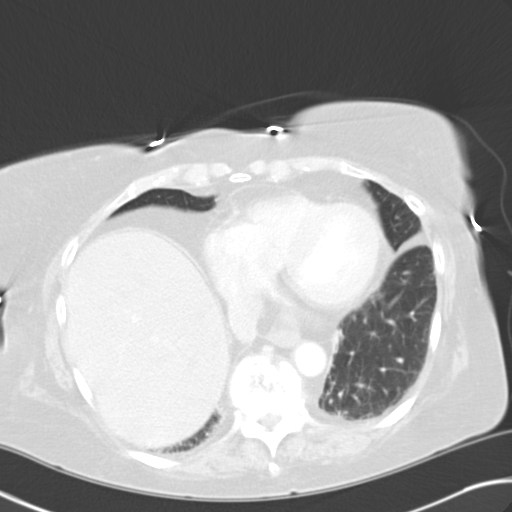
[im 21/54  mediastinal]
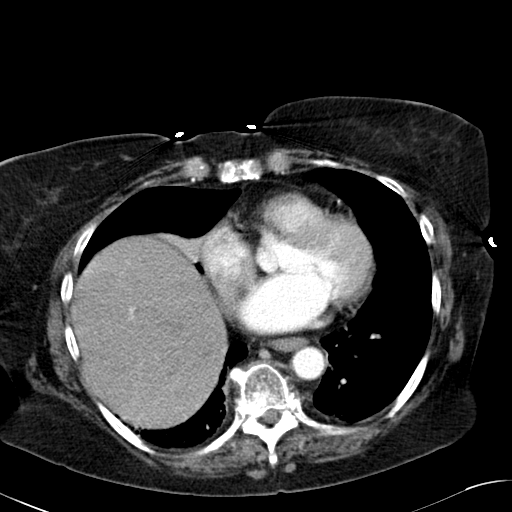
[im 21/54  lung]
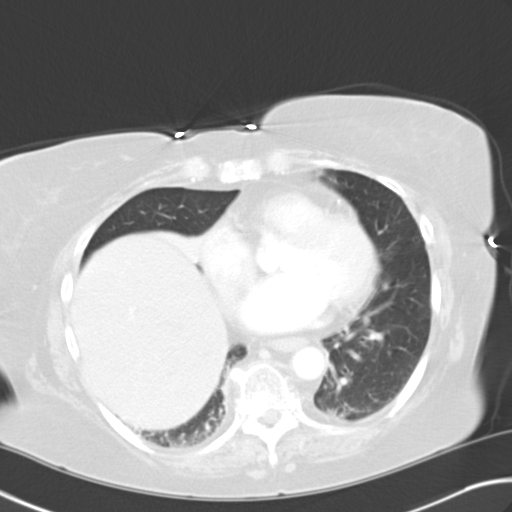
[im 25/54  lung]
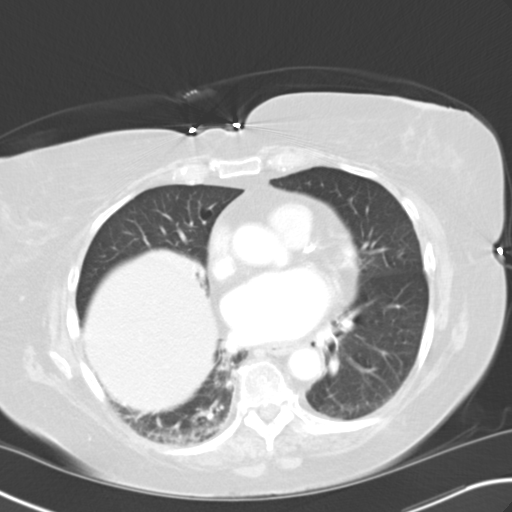
[im 26/54  lung]
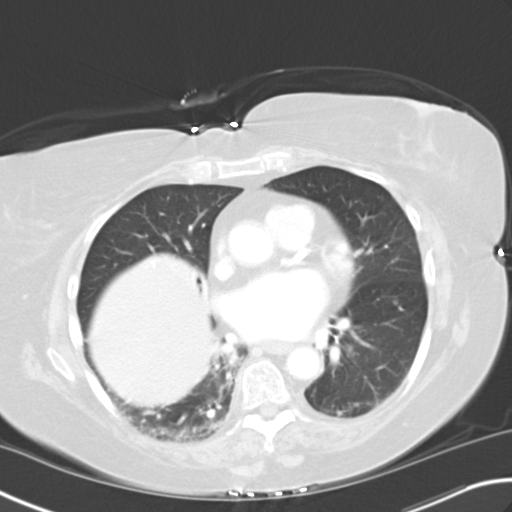
[im 27/54  lung]
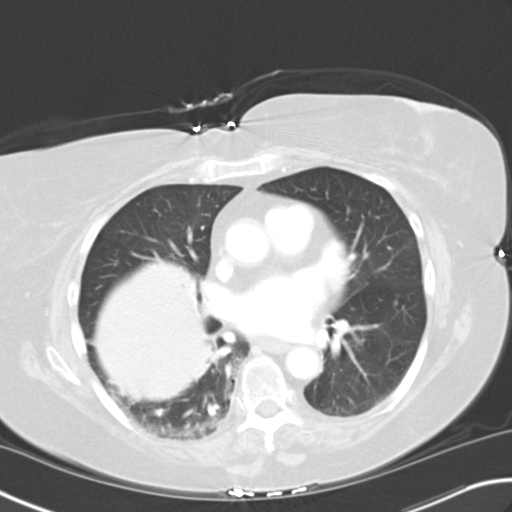
[im 29/54  mediastinal]
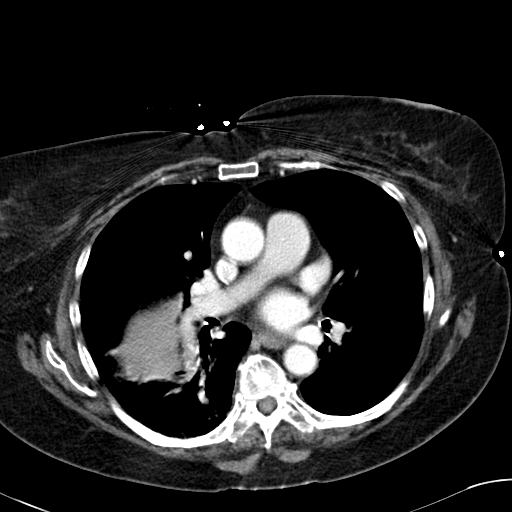
[im 29/54  lung]
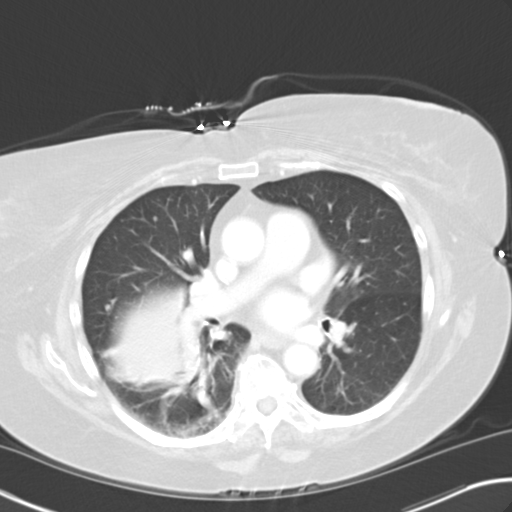
[im 33/54  lung]
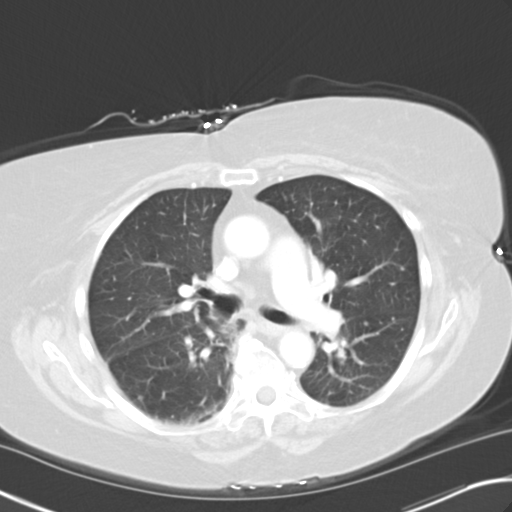
[im 37/54  lung]
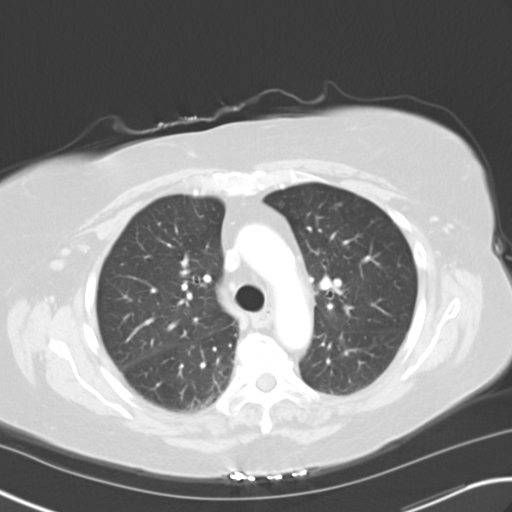
[im 41/54  lung]
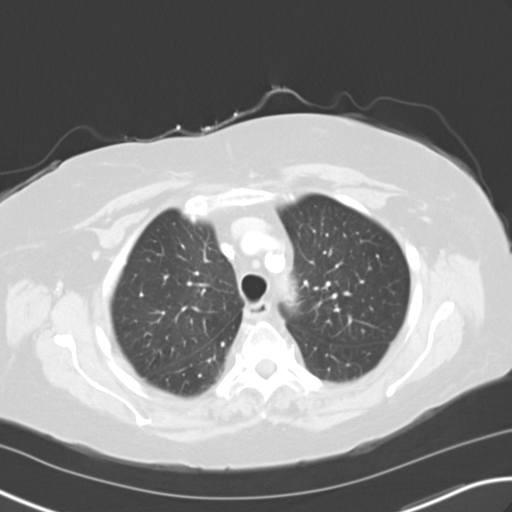
[im 45/54  mediastinal]
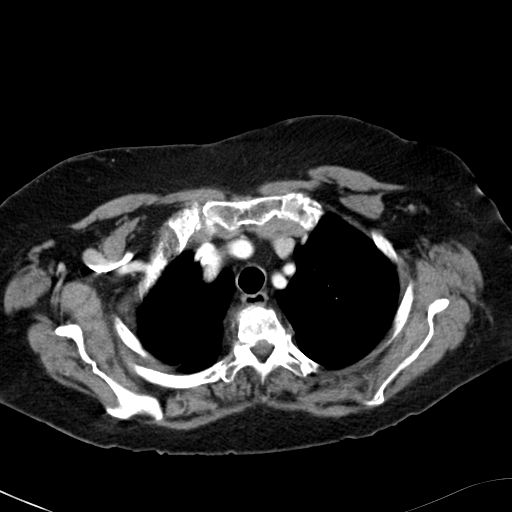
[im 45/54  lung]
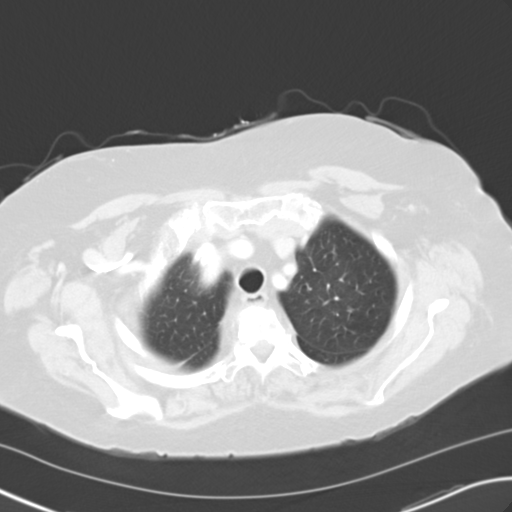
[im 49/54  lung]
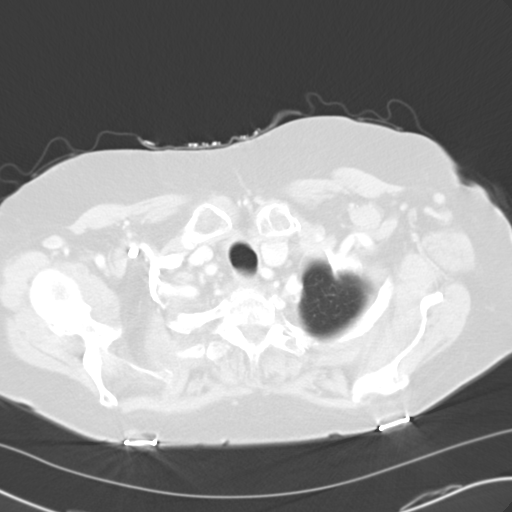

[14 of 32 positions shown; findings below may reference images not displayed]

FINDINGS: CT CHEST FINDINGS

Mediastinum/Nodes: Heart is normal size. Coronary artery
calcifications within the left anterior descending coronary artery.
Aorta is normal caliber. No mediastinal, hilar, or axillary
adenopathy. Small hiatal hernia.

Lungs/Pleura: Multiple bilateral pulmonary nodules. 11 mm nodule
noted in the posterior right lower lobe near the azygoesophageal
recess measuring 11 mm on image 32. Right lower lobe nodule on image
23 measures 5 mm. These are stable since prior study. 5 mm left
upper lobe nodule on image 12. 4 mm left lower lobe nodule on image
22. Numerous other small bilateral nodules. No pleural effusions.

Chest wall: Chest wall soft tissues are unremarkable.

Upper abdomen: No acute findings in the visualized upper abdomen.

Musculoskeletal: Multi level vertebroplasty changes in the lower
thoracic spine. Degenerative changes. No acute bony abnormality.
IMPRESSION: Multiple bilateral small pulmonary nodules as described above. Those
visualized in the lung bases are stable when compared to recent CT
abdomen. These could be followed with repeat chest CT in 6 months to
assess stability.

Coronary artery disease.
